# Patient Record
Sex: Female | Born: 1935 | Race: White | Hispanic: No | Marital: Married | Smoking: Never smoker
Health system: Southern US, Community
[De-identification: ages and names within clinical notes are randomized; demographics above are authoritative.]

## PROBLEM LIST (undated history)

## (undated) DIAGNOSIS — Z8619 Personal history of other infectious and parasitic diseases: Secondary | ICD-10-CM

## (undated) DIAGNOSIS — I420 Dilated cardiomyopathy: Secondary | ICD-10-CM

## (undated) DIAGNOSIS — K222 Esophageal obstruction: Secondary | ICD-10-CM

## (undated) DIAGNOSIS — R1310 Dysphagia, unspecified: Secondary | ICD-10-CM

## (undated) DIAGNOSIS — E039 Hypothyroidism, unspecified: Secondary | ICD-10-CM

## (undated) DIAGNOSIS — I4891 Unspecified atrial fibrillation: Secondary | ICD-10-CM

## (undated) DIAGNOSIS — I5022 Chronic systolic (congestive) heart failure: Secondary | ICD-10-CM

## (undated) DIAGNOSIS — I10 Essential (primary) hypertension: Secondary | ICD-10-CM

## (undated) DIAGNOSIS — R42 Dizziness and giddiness: Secondary | ICD-10-CM

## (undated) HISTORY — DX: Chronic systolic (congestive) heart failure: I50.22

## (undated) HISTORY — PX: APPENDECTOMY: SHX54

## (undated) HISTORY — DX: Personal history of other infectious and parasitic diseases: Z86.19

## (undated) HISTORY — PX: THYROIDECTOMY: SHX17

## (undated) HISTORY — DX: Dysphagia, unspecified: R13.10

## (undated) HISTORY — PX: FOOT SURGERY: SHX648

## (undated) HISTORY — PX: TONSILLECTOMY: SHX5217

## (undated) HISTORY — DX: Esophageal obstruction: K22.2

## (undated) HISTORY — DX: Unspecified atrial fibrillation: I48.91

## (undated) HISTORY — DX: Dizziness and giddiness: R42

## (undated) HISTORY — PX: VEIN LIGATION: SHX2652

## (undated) HISTORY — PX: CATARACT EXTRACTION: SUR2

## (undated) HISTORY — DX: Hypothyroidism, unspecified: E03.9

## (undated) HISTORY — DX: Dilated cardiomyopathy: I42.0

## (undated) HISTORY — DX: Essential (primary) hypertension: I10

---

## 1998-09-26 ENCOUNTER — Other Ambulatory Visit: Admission: RE | Admit: 1998-09-26 | Discharge: 1998-09-26 | Payer: Self-pay | Admitting: Family Medicine

## 1999-08-24 ENCOUNTER — Emergency Department (HOSPITAL_COMMUNITY): Admission: EM | Admit: 1999-08-24 | Discharge: 1999-08-24 | Payer: Self-pay | Admitting: Emergency Medicine

## 1999-08-24 ENCOUNTER — Encounter: Payer: Self-pay | Admitting: Emergency Medicine

## 1999-09-03 ENCOUNTER — Encounter: Payer: Self-pay | Admitting: Family Medicine

## 1999-09-03 ENCOUNTER — Encounter: Admission: RE | Admit: 1999-09-03 | Discharge: 1999-09-03 | Payer: Self-pay | Admitting: Family Medicine

## 1999-10-05 ENCOUNTER — Ambulatory Visit (HOSPITAL_COMMUNITY): Admission: RE | Admit: 1999-10-05 | Discharge: 1999-10-05 | Payer: Self-pay | Admitting: Neurosurgery

## 1999-10-05 ENCOUNTER — Encounter: Payer: Self-pay | Admitting: Neurosurgery

## 1999-10-09 ENCOUNTER — Encounter: Payer: Self-pay | Admitting: Neurosurgery

## 1999-10-09 ENCOUNTER — Encounter: Admission: RE | Admit: 1999-10-09 | Discharge: 1999-10-09 | Payer: Self-pay | Admitting: Neurosurgery

## 1999-10-16 ENCOUNTER — Ambulatory Visit (HOSPITAL_COMMUNITY): Admission: RE | Admit: 1999-10-16 | Discharge: 1999-10-16 | Payer: Self-pay | Admitting: Neurosurgery

## 1999-10-16 ENCOUNTER — Encounter: Payer: Self-pay | Admitting: Neurosurgery

## 1999-10-20 ENCOUNTER — Encounter: Payer: Self-pay | Admitting: Neurological Surgery

## 1999-10-20 ENCOUNTER — Encounter (INDEPENDENT_AMBULATORY_CARE_PROVIDER_SITE_OTHER): Payer: Self-pay | Admitting: *Deleted

## 1999-10-20 ENCOUNTER — Encounter (INDEPENDENT_AMBULATORY_CARE_PROVIDER_SITE_OTHER): Payer: Self-pay | Admitting: Specialist

## 1999-10-21 ENCOUNTER — Inpatient Hospital Stay (HOSPITAL_COMMUNITY): Admission: RE | Admit: 1999-10-21 | Discharge: 1999-10-29 | Payer: Self-pay | Admitting: Neurological Surgery

## 1999-10-21 ENCOUNTER — Encounter: Payer: Self-pay | Admitting: General Surgery

## 1999-10-22 ENCOUNTER — Encounter: Payer: Self-pay | Admitting: Neurological Surgery

## 1999-11-10 ENCOUNTER — Encounter: Admission: RE | Admit: 1999-11-10 | Discharge: 1999-11-10 | Payer: Self-pay | Admitting: Internal Medicine

## 1999-11-27 ENCOUNTER — Encounter: Admission: RE | Admit: 1999-11-27 | Discharge: 1999-11-27 | Payer: Self-pay | Admitting: Neurosurgery

## 1999-11-27 ENCOUNTER — Encounter: Payer: Self-pay | Admitting: Neurosurgery

## 2000-01-06 ENCOUNTER — Encounter: Admission: RE | Admit: 2000-01-06 | Discharge: 2000-01-06 | Payer: Self-pay | Admitting: Internal Medicine

## 2000-03-19 ENCOUNTER — Encounter: Admission: RE | Admit: 2000-03-19 | Discharge: 2000-03-19 | Payer: Self-pay | Admitting: Internal Medicine

## 2000-03-29 ENCOUNTER — Encounter: Payer: Self-pay | Admitting: Internal Medicine

## 2000-03-29 ENCOUNTER — Ambulatory Visit (HOSPITAL_COMMUNITY): Admission: RE | Admit: 2000-03-29 | Discharge: 2000-03-29 | Payer: Self-pay | Admitting: Internal Medicine

## 2000-04-06 ENCOUNTER — Ambulatory Visit (HOSPITAL_COMMUNITY): Admission: RE | Admit: 2000-04-06 | Discharge: 2000-04-06 | Payer: Self-pay | Admitting: Internal Medicine

## 2000-04-06 ENCOUNTER — Encounter: Payer: Self-pay | Admitting: Internal Medicine

## 2000-05-07 ENCOUNTER — Encounter: Admission: RE | Admit: 2000-05-07 | Discharge: 2000-05-07 | Payer: Self-pay | Admitting: Internal Medicine

## 2000-09-29 ENCOUNTER — Other Ambulatory Visit: Admission: RE | Admit: 2000-09-29 | Discharge: 2000-09-29 | Payer: Self-pay | Admitting: Family Medicine

## 2001-09-09 ENCOUNTER — Encounter: Payer: Self-pay | Admitting: Family Medicine

## 2001-09-09 ENCOUNTER — Encounter: Admission: RE | Admit: 2001-09-09 | Discharge: 2001-09-09 | Payer: Self-pay | Admitting: Family Medicine

## 2004-03-17 ENCOUNTER — Inpatient Hospital Stay (HOSPITAL_COMMUNITY): Admission: RE | Admit: 2004-03-17 | Discharge: 2004-03-18 | Payer: Self-pay | Admitting: General Surgery

## 2004-07-17 ENCOUNTER — Emergency Department (HOSPITAL_COMMUNITY): Admission: EM | Admit: 2004-07-17 | Discharge: 2004-07-17 | Payer: Self-pay | Admitting: Emergency Medicine

## 2007-04-29 ENCOUNTER — Encounter: Admission: RE | Admit: 2007-04-29 | Discharge: 2007-04-29 | Payer: Self-pay | Admitting: Family Medicine

## 2007-06-09 ENCOUNTER — Encounter: Admission: RE | Admit: 2007-06-09 | Discharge: 2007-06-09 | Payer: Self-pay | Admitting: Family Medicine

## 2007-06-17 ENCOUNTER — Encounter: Admission: RE | Admit: 2007-06-17 | Discharge: 2007-06-17 | Payer: Self-pay | Admitting: Family Medicine

## 2009-12-24 ENCOUNTER — Ambulatory Visit: Payer: Self-pay | Admitting: Cardiology

## 2010-01-27 ENCOUNTER — Ambulatory Visit: Payer: Self-pay | Admitting: Cardiology

## 2010-02-24 ENCOUNTER — Ambulatory Visit: Payer: Self-pay | Admitting: Cardiology

## 2010-03-24 ENCOUNTER — Ambulatory Visit: Payer: Self-pay | Admitting: Cardiology

## 2010-04-21 ENCOUNTER — Ambulatory Visit: Payer: Self-pay | Admitting: Cardiology

## 2010-05-21 ENCOUNTER — Ambulatory Visit: Payer: Self-pay | Admitting: Cardiology

## 2010-06-08 ENCOUNTER — Encounter: Payer: Self-pay | Admitting: Cardiology

## 2010-06-17 ENCOUNTER — Ambulatory Visit: Payer: Self-pay | Admitting: Cardiology

## 2010-07-14 ENCOUNTER — Encounter (INDEPENDENT_AMBULATORY_CARE_PROVIDER_SITE_OTHER): Payer: 59

## 2010-07-14 DIAGNOSIS — Z7901 Long term (current) use of anticoagulants: Secondary | ICD-10-CM

## 2010-07-14 DIAGNOSIS — I4891 Unspecified atrial fibrillation: Secondary | ICD-10-CM

## 2010-08-04 ENCOUNTER — Ambulatory Visit (INDEPENDENT_AMBULATORY_CARE_PROVIDER_SITE_OTHER): Payer: 59 | Admitting: Cardiology

## 2010-08-04 DIAGNOSIS — I428 Other cardiomyopathies: Secondary | ICD-10-CM

## 2010-08-04 DIAGNOSIS — I4891 Unspecified atrial fibrillation: Secondary | ICD-10-CM

## 2010-08-04 DIAGNOSIS — I10 Essential (primary) hypertension: Secondary | ICD-10-CM

## 2010-08-04 DIAGNOSIS — Z7901 Long term (current) use of anticoagulants: Secondary | ICD-10-CM

## 2010-08-06 ENCOUNTER — Other Ambulatory Visit: Payer: Self-pay | Admitting: Family Medicine

## 2010-08-06 ENCOUNTER — Ambulatory Visit
Admission: RE | Admit: 2010-08-06 | Discharge: 2010-08-06 | Disposition: A | Discharge status: Home / Self Care | Payer: 59 | Source: Ambulatory Visit | Attending: Family Medicine | Admitting: Family Medicine

## 2010-08-06 DIAGNOSIS — R9389 Abnormal findings on diagnostic imaging of other specified body structures: Secondary | ICD-10-CM

## 2010-08-06 DIAGNOSIS — W19XXXA Unspecified fall, initial encounter: Secondary | ICD-10-CM

## 2010-08-06 IMAGING — CR DG CHEST 2V
2 series · 2 of 2 positions shown · non-contrast
Comparison: Chest x-ray of [DATE]

CLINICAL DATA: Fell this morning, questioned abnormality at the
lung base on left shoulder films

CHEST - 2 VIEW

[view not recorded (1 of 2)]
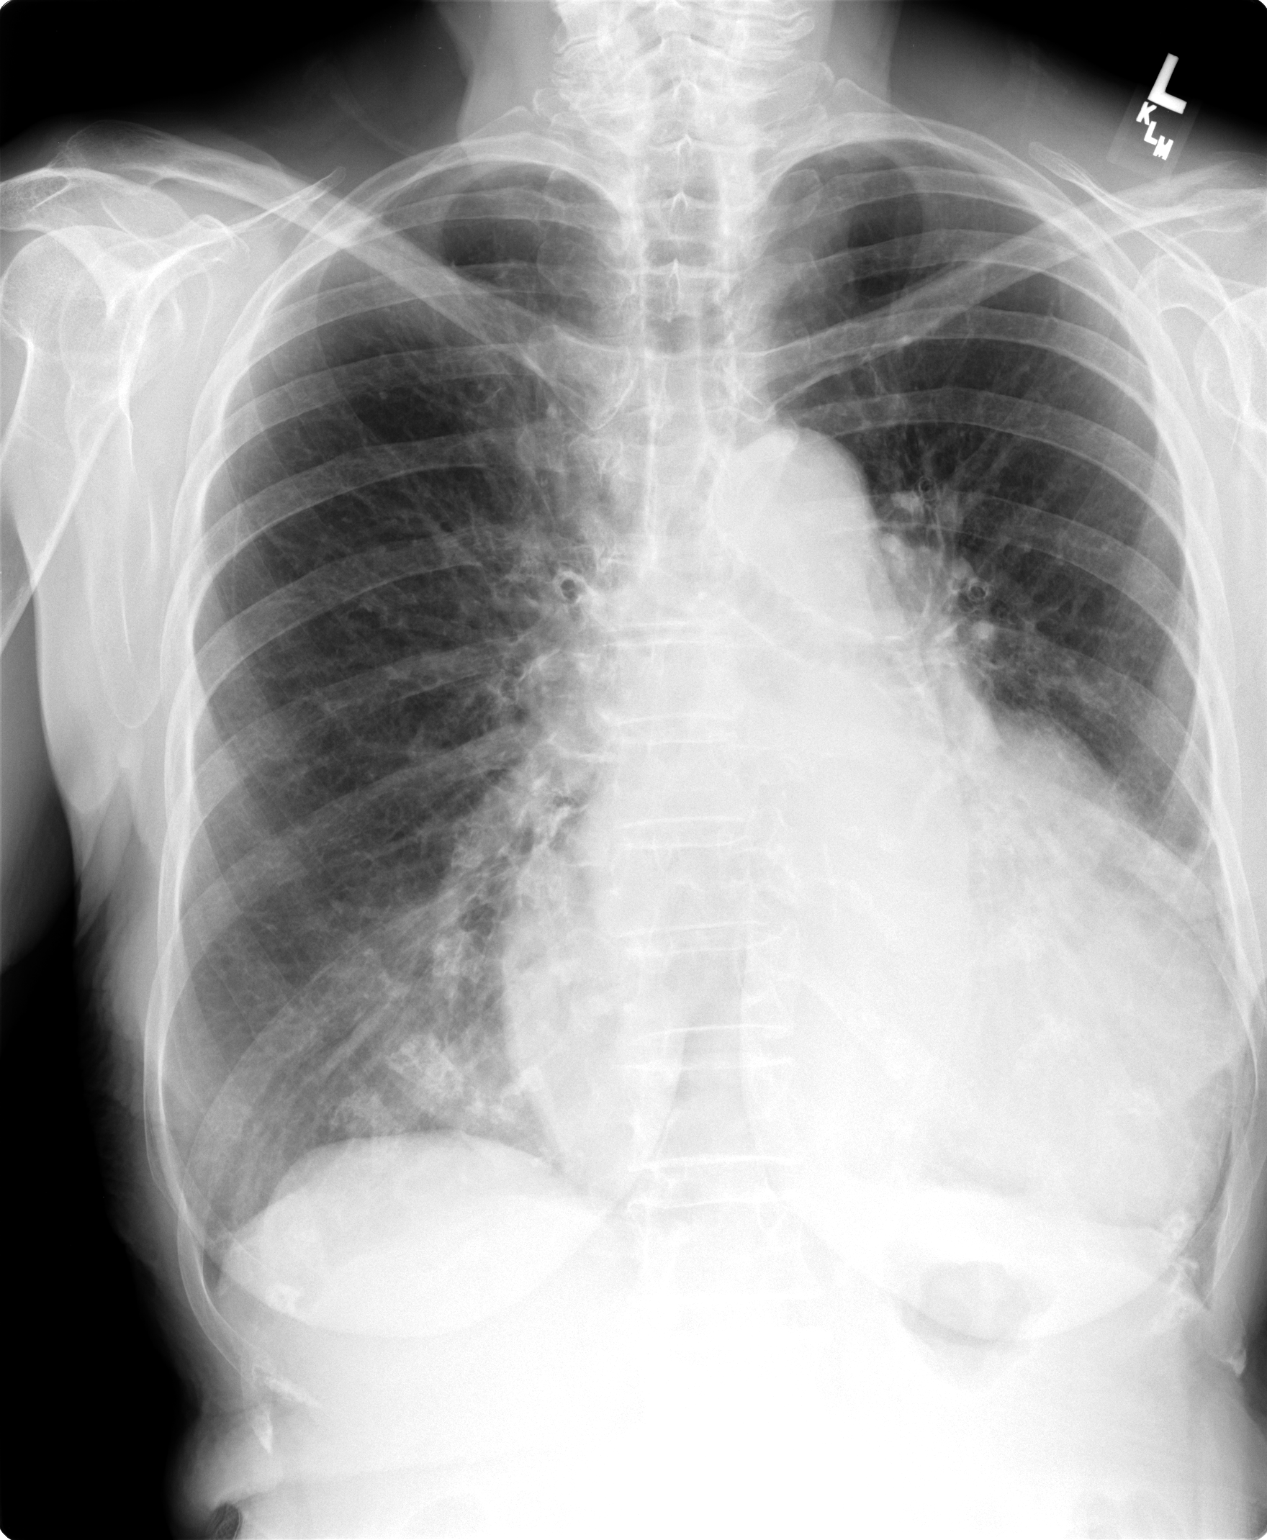

[view not recorded (2 of 2)]
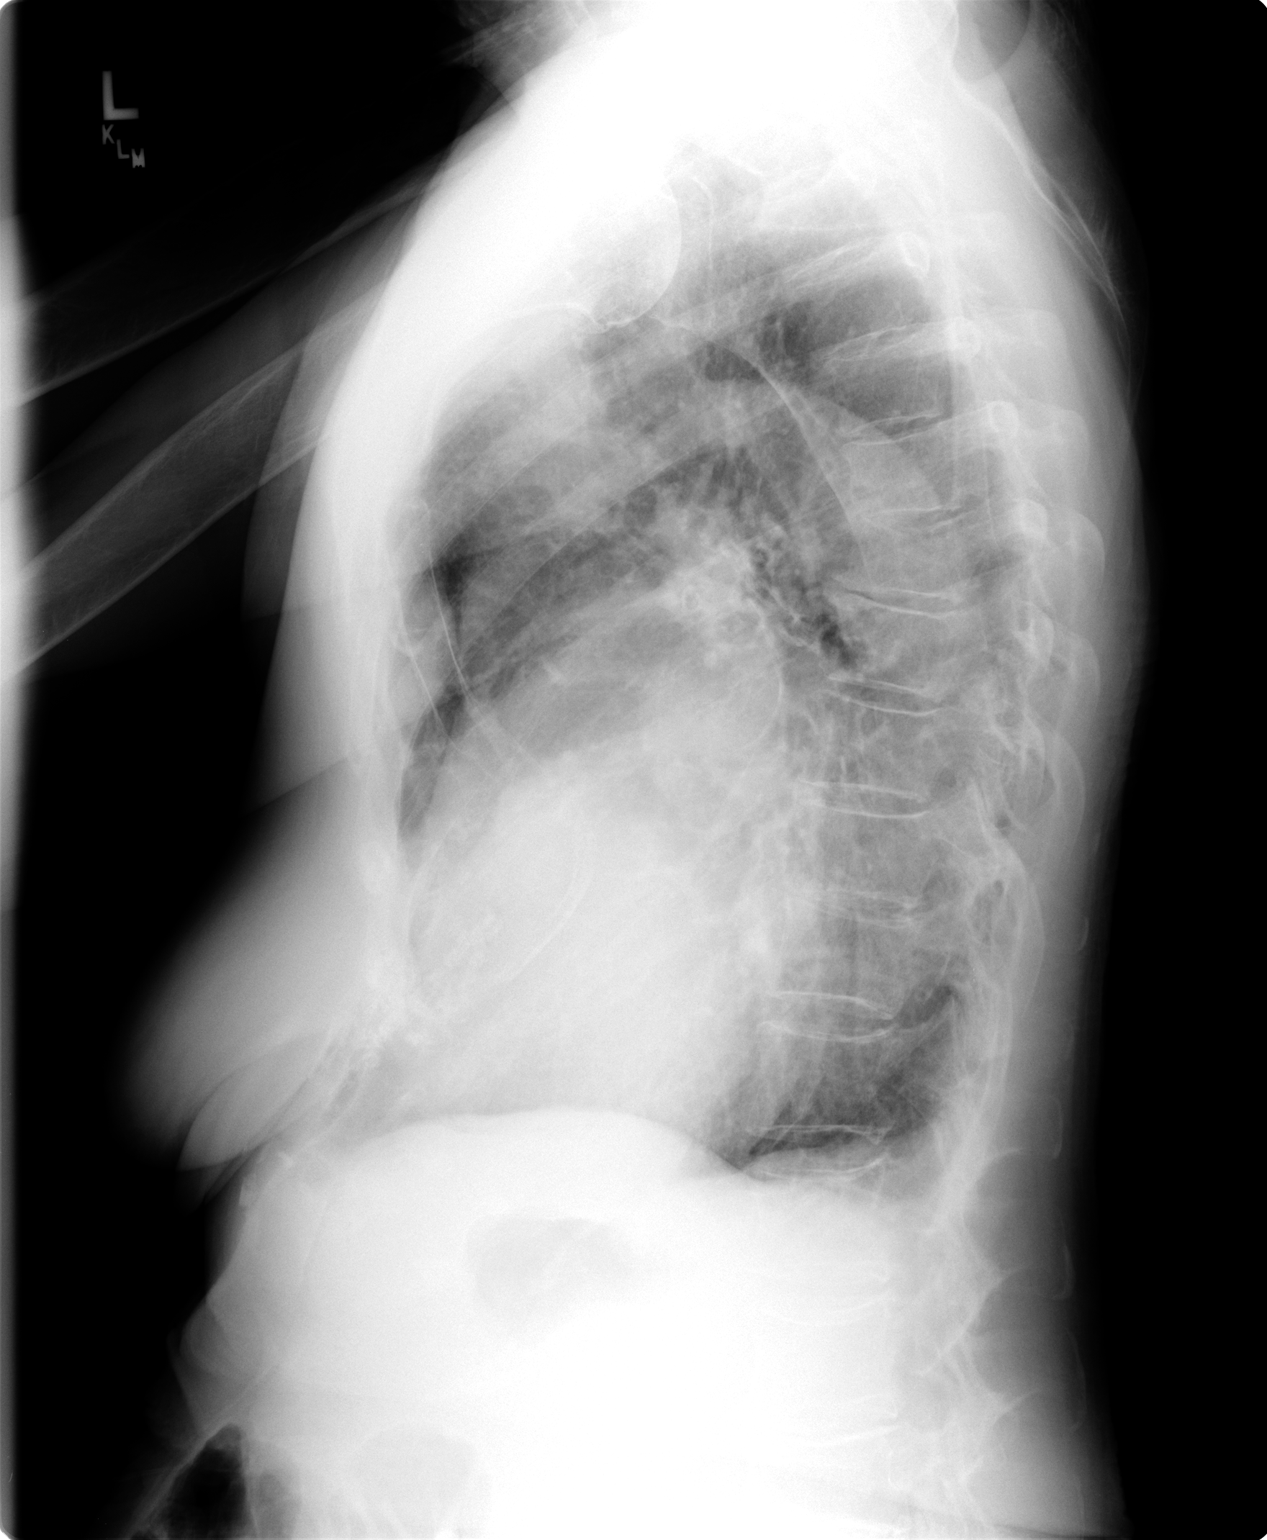

[2 of 2 positions shown; findings below may reference images not displayed]

FINDINGS: The opacity noted on short film represents a
significantly enlarged cardiac silhouette.  No infiltrate or
effusion is seen.  The bones are osteopenic.
IMPRESSION: The abnormality on left shoulder films at the left lung base
represents an enlarged heart.  No infiltrate or effusion is seen.

## 2010-08-13 ENCOUNTER — Telehealth: Payer: Self-pay | Admitting: *Deleted

## 2010-08-13 NOTE — Telephone Encounter (Signed)
Message copied by Murrell Redden on Wed Aug 13, 2010  1:56 PM ------      Message from: Sheffield Slider      Created: Wed Aug 13, 2010  9:34 AM      Regarding: UPDATE/JORDAN      Contact: [phone]       PT WANTS TO UPDATE Gabriela Hernandez ON SOMETHING ANOTHER MD TOLD HER.  CHART IN BOX.  956AM

## 2010-08-13 NOTE — Telephone Encounter (Signed)
Message copied by Murrell Redden on Wed Aug 13, 2010  2:02 PM ------      Message from: Sheffield Slider      Created: Wed Aug 13, 2010  9:34 AM      Regarding: UPDATE/JORDAN      Contact: [phone]       PT WANTS TO UPDATE Gabriela Hernandez ON SOMETHING ANOTHER MD TOLD HER.  CHART IN BOX.  956AM

## 2010-08-13 NOTE — Telephone Encounter (Signed)
Called stating she fell last week and injured her shoulder.  Has been on antibiotic. Will recheck inr next week

## 2010-08-19 ENCOUNTER — Ambulatory Visit (INDEPENDENT_AMBULATORY_CARE_PROVIDER_SITE_OTHER): Payer: 59 | Admitting: *Deleted

## 2010-08-19 DIAGNOSIS — I4891 Unspecified atrial fibrillation: Secondary | ICD-10-CM

## 2010-08-19 DIAGNOSIS — Z7901 Long term (current) use of anticoagulants: Secondary | ICD-10-CM

## 2010-08-20 ENCOUNTER — Encounter: Payer: 59 | Admitting: *Deleted

## 2010-09-08 ENCOUNTER — Encounter: Payer: 59 | Admitting: *Deleted

## 2010-09-16 ENCOUNTER — Ambulatory Visit (INDEPENDENT_AMBULATORY_CARE_PROVIDER_SITE_OTHER): Payer: 59 | Admitting: *Deleted

## 2010-09-16 DIAGNOSIS — I4891 Unspecified atrial fibrillation: Secondary | ICD-10-CM

## 2010-09-16 LAB — POCT INR: INR: 2.8

## 2010-09-17 ENCOUNTER — Encounter: Payer: 59 | Admitting: *Deleted

## 2010-10-03 NOTE — Op Note (Signed)
Gabriela Hernandez, LOS             ACCOUNT NO.:  [id number]   MEDICAL RECORD NO.:  [id number]          PATIENT TYPE:  INP   LOCATION:  0005                         FACILITY:  [facility]   PHYSICIAN:  Sharlet Salina T. Hoxworth, M.D.DATE OF BIRTH:  11-07-1926   DATE OF PROCEDURE:  03/17/2004  DATE OF DISCHARGE:                                 OPERATIVE REPORT   PREOPERATIVE DIAGNOSIS:  Right lower quadrant ventral incisional hernia.   POSTOPERATIVE DIAGNOSIS:  Right lower quadrant ventral incisional hernia and  small right inguinal hernia.   PROCEDURE:  Laparoscopic repair of ventral incisional and right inguinal  hernias.   SURGEON:  Lorne Skeens. Hoxworth, M.D.   ANESTHESIA:  General.   INDICATIONS FOR PROCEDURE:  Gabriela Hernandez is a 77 year old female who  presents with an increasingly painful intermittent bulge in the right lower  quadrant.  Examination reveals a ventral incisional hernia in the right  lower quadrant that appears to be coming through the inferior aspect of a  previous right lower abdominal paramedian incision for appendectomy.  She is  obviously having intermittent incarceration, and laparoscopic and possible  open repair has been recommended and accepted.  The nature of the procedure,  its indications, risks of bleeding, infection, and recurrence were discussed  and understood.  She is now brought to the operating room for this  procedure.   DESCRIPTION OF PROCEDURE:  The patient was brought to the operating room and  placed in the supine position on the operating table.  General endotracheal  anesthesia was induced.  She received broad-spectrum antibiotics  preoperatively.  A Foley catheter and PS were in place.  The abdomen was  widely sterilely prepped and draped.  The trocar sites were anesthetized  with Marcaine prior to the incisions.   Through a 1-cm incision in the left midabdomen, the peritoneal cavity was  easily accessed with an Optiview trocar, and  pneumoperitoneum was  established.  Video laparoscopy revealed, as expected, an incisional hernia  approximately 3 to 4 cm in diameter in the right lower quadrant, likely at  the inferior end of her previous paramedian incision.  Also noted a couple  of centimeters posterior and inferior to this was a small indirect inguinal  hernia as well.  I felt that this should be repaired as well, and it  appeared that a single piece of mesh could be used to nicely cover both  areas.  In order to provide fixation posteriorly to the inguinal hernia, a  dissection was performed posterior to this, incising the peritoneum along  the pubis and up towards the internal ring.  The peritoneum was stripped  posteriorly.  The hernia sac was reduced completely and completely excised  and removed from the abdominal cavity. The peritoneum was then cleared off  of the pubic tubercle and Cooper's ligaments.  The iliac vessels were  carefully identified and protected.  There were no adhesions up into the  incisional hernia.  A 10 x 15-cm piece of Parietex dual mesh was chosen.  Three 0 Prolene sutures were placed along the superior edge of the mesh  where they could  be brought through the anterior abdominal wall.  The mesh  was then moistened and a roll introduced into the abdominal cavity.  It was  then unfurled and laid over both defects and was seen to provide a nice  broad coverage.  I initially affixed the medial inferior corner of the mesh  to the pubic symphysis with the endo tacker, and then the inferior edge was  tacked down along Cooper's ligament, stopping short of the iliac vessels.  The inferior edge of the mesh was then allowed to lie over the iliac vessels  in the posterior area of the inguinal canal without tacks.  The previously  placed Prolene sutures along the superior and anterior edge of the mesh were  then brought out through corresponding small stab wounds and secured.  The  edge of the mesh was  then tacked circumferentially to the anterior abdominal  wall, tacking just anteriorly and superiorly where the tacker could be felt  through the anterior abdominal wall.  Again, this appeared to provide nice  broad coverage of both the inguinal space and the incisional hernia with  several centimeter overlap.  The peritoneal flap was then tacked back over  the posterior portion of the mesh.  The area was inspected for hemostasis  which appeared complete.  The trocars were removed under direct vision and  all CO2 evacuated.  The skin incisions were closed with interrupted  subcuticular Monocryl and Steri-Strips.  Sponge, needle, and instrument  counts were correct.  Dressings were applied and the patient taken to  recovery in good condition.      BTH/MEDQ  D:  03/17/2004  T:  03/17/2004  Job:  [id number]

## 2010-10-03 NOTE — Discharge Summary (Signed)
[place]. [facility]  Patient:    Gabriela Hernandez, Gabriela Hernandez                    MRN: [id number] Adm. Date:  [id number] Disc. Date: [id number] Attending:  Coletta Memos                           Discharge Summary  ADMISSION DIAGNOSIS:  Hip pain and right lower extremity pain.  DISCHARGE DIAGNOSIS:  Right Staphylococcus aureus infection of the right sacroiliac joint.  INDICATION:  This is a patient who I had seen in my office for a number of weeks for evaluation of right lower extremity pain.  MRI was never impressive enough nor was it anatomically correct for the type of pain that she described.  She also had plain x-rays which were done prior to admission which did not show any abnormalities.  Upon my absence, she called up Dr. Danielle Dess who had the patient come in.  She had another MRI scan which revealed an infiltrative lesion involving the ileum, posterior iliac crest, and the sacrum around the region of the sacroiliac joint.  Since she had been on Coumadin, that needed to be stopped, and she was admitted to the hospital October 20, 1999, so she could undergo a needle biopsy of the right pelvis and hip.  The initial biopsy was negative, and she underwent a second biopsy.  What that showed was a Staphylococcus aureus infection of the right sacroiliac joint. She had a consultation by the infectious disease service and was started on Ancef.  She also had a PICC line placed.  She had a TEE during her hospitalization which was negative and a general surgery consultation, in as much as we were not sure prior to diagnosis of infection, if she would need to have operative removal of this mass.  At the time of discharge, the patient was moving her right lower extremity well though with significant pain.  She was receiving Kefzol.  She was to be followed up by the infectious disease service here.  She was also going to come back to see me in approximately three weeks with x-rays  of the right hip.  DISCHARGE MEDICATIONS:  Included Kefzol, Lasix, Synthroid, Accupril, Prempro, along with pain medications. DD:  01/16/00 TD:  01/18/00 Job: [id number]

## 2010-10-03 NOTE — Consult Note (Signed)
Gabriela Hernandez  Patient:    Gabriela, Hernandez                    MRN: [id number] Proc. Date: 10/20/99 Adm. Date:  [id number] Attending:  Jonne Ply                          Consultation Report  HISTORY OF PRESENT ILLNESS:  I was asked by Dr. Franky Macho to evaluate Gabriela Hernandez for persistent right back, hip, and leg pain with a possible right pelvic mass.  Gabriela Hernandez presents with an approximately two-month history of persistent pain.  This had the fairly sudden onset over a day or two in early April.  Gabriela Hernandez was initially treated symptomatically with muscle relaxers and anti-inflammatories, without improvement.  Gabriela Hernandez describes significant aching pain in her low right back posteriorly, radiating down along her right hip, buttock, and posterior right leg.  No numbness or weakness.  The pain has been somewhat positional and related to activity, but essentially constant.  Gabriela Hernandez saw Dr. Franky Macho in mid May, and a workup was initiated of plain films of the lumbosacral spine which were negative. Subsequently, an MRI of the LS spine was obtained, which again, was negative. Her pain persisted and recently, an MR of the right hip was obtained.  I do not have this report at the time of the consultation, but it has apparently shown an abnormal infiltrative process or mass involving the right side of the pelvis.  It was not clear, apparently, whether this was inflammatory or neoplastic.  A fine needle aspirate was performed today without return of apparent fluid or pus.  Cytology and gram stain and cultures were obtained and are all pending at the time of this dictation.  During this time, the patient has had no GI symptoms whatsoever.  Gabriela Hernandez specifically denies nausea, vomiting, change in bowel habits, melena, hematochezia, or weight loss.  Gabriela Hernandez has not had any fever or chills.  Gabriela Hernandez has no malaise or constitutional symptoms.  Gabriela Hernandez has no previous history of any  similar pain.  Gabriela Hernandez has had no vaginal bleeding or discharge.  PAST MEDICAL HISTORY:  Surgical history includes two C sections and an abdominal hernia repair in 1997.  Gabriela Hernandez has also had a thyroidectomy and varicose vein stripping.  Gabriela Hernandez has a history of atrial fibrillation, mild CHF, and lower extremity edema.  CHRONIC MEDICATIONS:  Lasix, Coumadin, Accupril, Synthroid, and Prempro.  Gabriela Hernandez has been off her Coumadin for several days prior to the FNA.  ALLERGIES:  No known drug allergies.  SOCIAL HISTORY:  Gabriela Hernandez is married.  Does not smoke cigarettes or drink alcohol.  FAMILY HISTORY:  Noncontributory.  REVIEW OF SYSTEMS:  Positive extremities as above.  Positive cardiovascular for occasional palpitations and leg swelling and mild DOE.  GI negative as above.  PHYSICAL EXAMINATION:  VITAL SIGNS:  Temperature 97.5, pulse 70, respirations 18, blood pressure 130/80.  GENERAL:  Gabriela Hernandez is a healthy-appearing elderly white female in no acute distress.  SKIN:  Warm and dry without rash or infection.  HEENT:  Well-healed thyroidectomy scar.  No neck masses or adenopathy. Sclerae clear.  LUNGS:  Clear to auscultation.  CARDIAC:  Irregular rhythm.  No murmurs or gallops.  Trace lower extremity edema, slightly greater on the right than the left, with superficial varicosities.  BREASTS:  No masses, tenderness, or discharge.  LYMPH NODES:  No cervical, supraclavicular, axillary, or inguinal nodes  palpable.  ABDOMEN:  Well-healed low midline incisions.  Soft with very mild tenderness in the right lower quadrant to deep palpation.  I cannot appreciate any masses, fullness, or hepatosplenomegaly.  PELVIC/RECTAL:  No masses or tenderness.  No stool in the rectal vault.  EXTREMITIES:  Superficial varicosities and slight edema bilaterally, slightly greater on the right than the left.  There is some pain with flexion of the right hip.  NEUROLOGIC:  Gabriela Hernandez is alert and fully oriented.  Her motor  and sensory exams are grossly normal in the extremities.  ASSESSMENT AND PLAN:  Persistent right back and hip pain and a recent MRI apparently showing an infiltrative process in the right pelvis.  Clinically, there is nothing to suggest infection.  I would be more concerned about neoplasm.  I need to obtain her MR films and report for review and for further anatomic clarification I am going to obtain a CT scan of the abdomen and pelvis.  I will await this study and results of her cytology, cultures, and gram stain for further recommendations. DD:  10/20/99 TD:  10/21/99 Job: [id number]
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
## 2010-10-03 NOTE — Procedures (Signed)
[place]. [facility]  Patient:    Gabriela Hernandez, Gabriela Hernandez                    MRN: [id number] Proc. Date: 10/27/99 Adm. Date:  [id number] Attending:  Coletta Memos CC:         Stefani Dama, M.D.             Ward Roxan Hockey, M.D.                           Procedure Report  PROCEDURE:  Transesophageal echocardiogram.  INDICATIONS FOR STUDY:  Patient is a 77 year old white female with bacterial sacroiliitis.  Need to rule out endocarditis.  DESCRIPTION OF PROCEDURE:  Patient was sedated with 3 mg of IV Versed and 30 mg of IV Demerol.  Transesophageal scope was passed easily into the esophagus and excellent image quality was obtained.  FINDINGS:  The mitral valve was structurally delicate.  The aortic valve was trileaflet and appears normal.  The pulmonic valve is delicate and appears normal.  The tricuspid valve was normal.  The left atrium appears upper limits of normal in size.  The left atrial appendage was normal.  The right atrium was normal.  The interatrial septum was intact.  Right ventricular size and function appeared normal.  There was no pericardial effusion.  The left ventricle appears globally hypokinetic with severe left ventricular dysfunction.  Ejection fraction was estimated at 25-30%.  No thrombi were noted.  No vegetations were seen.  Doppler data, color-flow and pulse-wave Doppler demonstrated evidence of moderate mitral insufficiency.  There was trivial tricuspid insufficiency. Remainder of the Doppler study was normal, including Doppler of the left superior pulmonic vein.  Interrogation of the ascending and descending aorta demonstrated spontaneous echo contrast within the aorta.  No significant atherosclerosis was noted. There was no evidence of dissection.  FINAL INTERPRETATION: 1. Severe left ventricular dysfunction with global hypokinesia. 2. Moderate mitral insufficiency. 3. No vegetations noted. 4. Spontaneous echo contrast  noted in the aorta without evidence of thrombus. DD:  10/27/99 TD:  10/29/99 Job: [id number]

## 2010-10-15 ENCOUNTER — Ambulatory Visit (INDEPENDENT_AMBULATORY_CARE_PROVIDER_SITE_OTHER): Payer: 59 | Admitting: *Deleted

## 2010-10-15 DIAGNOSIS — I4891 Unspecified atrial fibrillation: Secondary | ICD-10-CM

## 2010-11-12 ENCOUNTER — Ambulatory Visit (INDEPENDENT_AMBULATORY_CARE_PROVIDER_SITE_OTHER): Payer: 59 | Admitting: *Deleted

## 2010-11-12 DIAGNOSIS — I4891 Unspecified atrial fibrillation: Secondary | ICD-10-CM

## 2010-11-12 LAB — POCT INR: INR: 3.1

## 2010-12-10 ENCOUNTER — Ambulatory Visit (INDEPENDENT_AMBULATORY_CARE_PROVIDER_SITE_OTHER): Payer: 59 | Admitting: *Deleted

## 2010-12-10 DIAGNOSIS — I4891 Unspecified atrial fibrillation: Secondary | ICD-10-CM

## 2010-12-10 LAB — POCT INR: INR: 2.4

## 2011-01-07 ENCOUNTER — Ambulatory Visit (INDEPENDENT_AMBULATORY_CARE_PROVIDER_SITE_OTHER): Payer: Medicare Other | Admitting: *Deleted

## 2011-01-07 DIAGNOSIS — I4891 Unspecified atrial fibrillation: Secondary | ICD-10-CM

## 2011-01-07 LAB — POCT INR: INR: 2.7

## 2011-02-02 ENCOUNTER — Encounter: Payer: Self-pay | Admitting: Cardiology

## 2011-02-04 ENCOUNTER — Ambulatory Visit (INDEPENDENT_AMBULATORY_CARE_PROVIDER_SITE_OTHER): Payer: Medicare Other | Admitting: *Deleted

## 2011-02-04 DIAGNOSIS — I4891 Unspecified atrial fibrillation: Secondary | ICD-10-CM

## 2011-02-11 ENCOUNTER — Ambulatory Visit (INDEPENDENT_AMBULATORY_CARE_PROVIDER_SITE_OTHER): Payer: Medicare Other | Admitting: Cardiology

## 2011-02-11 ENCOUNTER — Encounter: Payer: Self-pay | Admitting: Cardiology

## 2011-02-11 DIAGNOSIS — I10 Essential (primary) hypertension: Secondary | ICD-10-CM

## 2011-02-11 DIAGNOSIS — I4891 Unspecified atrial fibrillation: Secondary | ICD-10-CM

## 2011-02-11 DIAGNOSIS — I420 Dilated cardiomyopathy: Secondary | ICD-10-CM

## 2011-02-11 DIAGNOSIS — I428 Other cardiomyopathies: Secondary | ICD-10-CM

## 2011-02-11 NOTE — Patient Instructions (Signed)
Continue your current medications  I will see you again in 6 months.   

## 2011-02-12 DIAGNOSIS — I110 Hypertensive heart disease with heart failure: Secondary | ICD-10-CM | POA: Insufficient documentation

## 2011-02-12 DIAGNOSIS — I420 Dilated cardiomyopathy: Secondary | ICD-10-CM | POA: Insufficient documentation

## 2011-02-12 NOTE — Assessment & Plan Note (Signed)
Rate is well controlled and she is on chronic anticoagulation. We'll continue with her current therapy and follow up again in 6 months.

## 2011-02-12 NOTE — Progress Notes (Signed)
   Gabriela Hernandez Date of Birth: 1927-04-05   History of Present Illness: [name] is seen today for followup. She has a history of dilated cardiomyopathy and chronic atrial fibrillation. She reports that she is doing very well. She notes some mild fatigue. She has lost 7 pounds from her previous visit. She denies any shortness of breath, chest pain, or dizziness. She's had no increase in edema or orthopnea.  Current Outpatient Prescriptions on File Prior to Visit  Medication Sig Dispense Refill  . carvedilol (COREG) 25 MG tablet Take 25 mg by mouth 2 (two) times daily with a meal.        . furosemide (LASIX) 20 MG tablet Take 20 mg by mouth daily.        . Multiple Vitamin (MULTI-VITAMIN PO) Take by mouth daily.        . quinapril (ACCUPRIL) 20 MG tablet Take 20 mg by mouth daily. 1/2 daily        . THYROID PO Take by mouth.        . warfarin (COUMADIN) 7.5 MG tablet Take 7.5 mg by mouth as directed.          No Known Allergies  Past Medical History  Diagnosis Date  . Dilated cardiomyopathy     with ejection fraction of 30-35%  . Atrial fibrillation   . Hypertension   . Hypothyroidism   . Lightheadedness   . History of shingles     Past Surgical History  Procedure Date  . Cataract extraction   . Vein ligation   . Thyroidectomy   . Foot surgery   . Tonsillectomy   . Appendectomy   . Cesarean section     History  Smoking status  . Never Smoker   Smokeless tobacco  . Not on file    History  Alcohol Use No    Family History  Problem Relation Age of Onset  . Hypertension Mother   . Heart attack Father     Review of Systems: The review of systems is positive for weight loss. There's been no significant change in her diet. All other systems were reviewed and are negative.  Physical Exam: BP 130/80  Pulse 56  Ht 5\' 6"  (1.676 m)  Wt 140 lb 3.2 oz (63.594 kg)  BMI 22.63 kg/m2 She is a pleasant elderly white female in no acute distress. She is  normocephalic, atraumatic. Pupils are equal round and reactive to light and accommodation. Oropharynx is clear. Neck is supple no JVD, adenopathy, thyromegaly, or bruits. Lungs are clear. Cardiac exam reveals a grade 1-2/6 systolic murmur at apex. There is no S3. Abdomen is soft and nontender without masses. She has no significant edema. She does have diffuse superficial varicosities in her lower trinities. Neurologic exam is nonfocal. LABORATORY DATA: Recent INR was 3.9.  Assessment / Plan:

## 2011-02-12 NOTE — Assessment & Plan Note (Signed)
She remains asymptomatic. She is on appropriate therapy with Lasix, carvedilol, and Accupril. She will continue sodium restriction.

## 2011-02-12 NOTE — Assessment & Plan Note (Signed)
Blood pressure is well controlled on her current regimen. 

## 2011-02-25 ENCOUNTER — Ambulatory Visit (INDEPENDENT_AMBULATORY_CARE_PROVIDER_SITE_OTHER): Payer: Medicare Other | Admitting: *Deleted

## 2011-02-25 DIAGNOSIS — I4891 Unspecified atrial fibrillation: Secondary | ICD-10-CM

## 2011-03-25 ENCOUNTER — Ambulatory Visit (INDEPENDENT_AMBULATORY_CARE_PROVIDER_SITE_OTHER): Payer: Medicare Other | Admitting: *Deleted

## 2011-03-25 DIAGNOSIS — I4891 Unspecified atrial fibrillation: Secondary | ICD-10-CM

## 2011-03-25 LAB — POCT INR: INR: 2.2

## 2011-04-22 ENCOUNTER — Ambulatory Visit (INDEPENDENT_AMBULATORY_CARE_PROVIDER_SITE_OTHER): Payer: Medicare Other | Admitting: *Deleted

## 2011-04-22 DIAGNOSIS — I4891 Unspecified atrial fibrillation: Secondary | ICD-10-CM

## 2011-05-08 ENCOUNTER — Telehealth: Payer: Self-pay | Admitting: Cardiology

## 2011-05-08 NOTE — Telephone Encounter (Signed)
Is there any contraindication to this?

## 2011-05-08 NOTE — Telephone Encounter (Signed)
New msg Pt is taking mucinex and  Promethazine. She receiived these from Dr Clovis Riley. She wants to make sure she can take these with her current meds please let her know

## 2011-05-11 NOTE — Telephone Encounter (Signed)
Advised she could take Mucinex (blue) box; not with decongestant. States she is not nauseated so not taking the other medication.

## 2011-05-21 NOTE — Telephone Encounter (Signed)
Fu call Pt was prescibed promethazine with codeine, doxycycline. She wants to make sure that these meds are ok for her to take Please call

## 2011-05-22 NOTE — Telephone Encounter (Signed)
Patient was called,spoke with Dr.Jordan okay to take promethazine,codeine,doxycycline.Appointment scheduled to have INR checked 05/27/11.

## 2011-05-27 ENCOUNTER — Ambulatory Visit (INDEPENDENT_AMBULATORY_CARE_PROVIDER_SITE_OTHER): Payer: Medicare Other | Admitting: *Deleted

## 2011-05-27 DIAGNOSIS — I4891 Unspecified atrial fibrillation: Secondary | ICD-10-CM

## 2011-06-03 ENCOUNTER — Encounter: Payer: Medicare Other | Admitting: *Deleted

## 2011-06-17 ENCOUNTER — Other Ambulatory Visit: Payer: Self-pay | Admitting: *Deleted

## 2011-06-17 MED ORDER — WARFARIN SODIUM 5 MG PO TABS: 5.0000 mg | ORAL_TABLET | Freq: Every day | ORAL | Status: DC

## 2011-06-18 ENCOUNTER — Other Ambulatory Visit: Payer: Self-pay | Admitting: Pharmacist

## 2011-06-18 MED ORDER — WARFARIN SODIUM 5 MG PO TABS: 5.0000 mg | ORAL_TABLET | Freq: Every day | ORAL | Status: DC

## 2011-06-19 ENCOUNTER — Other Ambulatory Visit: Payer: Self-pay | Admitting: *Deleted

## 2011-06-19 ENCOUNTER — Other Ambulatory Visit: Payer: Self-pay

## 2011-06-19 MED ORDER — WARFARIN SODIUM 5 MG PO TABS: ORAL_TABLET | ORAL | Status: DC

## 2011-06-19 MED ORDER — WARFARIN SODIUM 5 MG PO TABS: 5.0000 mg | ORAL_TABLET | ORAL | Status: DC

## 2011-07-01 ENCOUNTER — Other Ambulatory Visit: Payer: Self-pay | Admitting: *Deleted

## 2011-07-02 ENCOUNTER — Telehealth: Payer: Self-pay | Admitting: Cardiology

## 2011-07-02 NOTE — Telephone Encounter (Signed)
New Problem:     Patient called in needing a refill of her carvedilol (COREG) 25 MG tablet.

## 2011-07-03 ENCOUNTER — Other Ambulatory Visit: Payer: Self-pay

## 2011-07-03 MED ORDER — CARVEDILOL 25 MG PO TABS: 25.0000 mg | ORAL_TABLET | Freq: Two times a day (BID) | ORAL | Status: DC

## 2011-07-08 ENCOUNTER — Ambulatory Visit (INDEPENDENT_AMBULATORY_CARE_PROVIDER_SITE_OTHER): Payer: Medicare Other | Admitting: Pharmacist

## 2011-07-08 DIAGNOSIS — I4891 Unspecified atrial fibrillation: Secondary | ICD-10-CM

## 2011-07-08 LAB — POCT INR: INR: 2.5

## 2011-08-04 ENCOUNTER — Other Ambulatory Visit: Payer: Self-pay | Admitting: Cardiology

## 2011-08-04 MED ORDER — FUROSEMIDE 20 MG PO TABS: 20.0000 mg | ORAL_TABLET | Freq: Every day | ORAL | Status: DC

## 2011-08-04 MED ORDER — QUINAPRIL HCL 20 MG PO TABS: 10.0000 mg | ORAL_TABLET | Freq: Every day | ORAL | Status: DC

## 2011-08-12 ENCOUNTER — Ambulatory Visit (INDEPENDENT_AMBULATORY_CARE_PROVIDER_SITE_OTHER): Payer: Medicare Other | Admitting: Pharmacist

## 2011-08-12 ENCOUNTER — Ambulatory Visit (INDEPENDENT_AMBULATORY_CARE_PROVIDER_SITE_OTHER): Payer: Medicare Other | Admitting: Cardiology

## 2011-08-12 ENCOUNTER — Encounter: Payer: Self-pay | Admitting: Cardiology

## 2011-08-12 VITALS — BP 140/70 | HR 59 | Ht 66.0 in | Wt 140.1 lb

## 2011-08-12 DIAGNOSIS — I428 Other cardiomyopathies: Secondary | ICD-10-CM

## 2011-08-12 DIAGNOSIS — Z7901 Long term (current) use of anticoagulants: Secondary | ICD-10-CM

## 2011-08-12 DIAGNOSIS — I10 Essential (primary) hypertension: Secondary | ICD-10-CM

## 2011-08-12 DIAGNOSIS — I4891 Unspecified atrial fibrillation: Secondary | ICD-10-CM

## 2011-08-12 DIAGNOSIS — I420 Dilated cardiomyopathy: Secondary | ICD-10-CM

## 2011-08-12 LAB — POCT INR: INR: 2.4

## 2011-08-12 NOTE — Assessment & Plan Note (Signed)
She has no signs or symptoms of congestive heart failure. She will continue with her ACE inhibitor, carvedilol, and diuretic therapy.

## 2011-08-12 NOTE — Assessment & Plan Note (Signed)
Her heart rate is adequately controlled. She is on chronic anticoagulation and INR is therapeutic.

## 2011-08-12 NOTE — Progress Notes (Signed)
   Gabriela Hernandez Noon Date of Birth: 10-15-1926   History of Present Illness: Gabriela Hernandez is seen today for followup. She has a history of dilated cardiomyopathy and chronic atrial fibrillation. She reports that she is doing very well.  She denies any shortness of breath, chest pain, or dizziness. She's had no increase in edema or orthopnea. She did have a bad virus in the summer and was sick for about 4 weeks.  Current Outpatient Prescriptions on File Prior to Visit  Medication Sig Dispense Refill  . carvedilol (COREG) 25 MG tablet Take 1 tablet (25 mg total) by mouth 2 (two) times daily with a meal.  60 tablet  5  . furosemide (LASIX) 20 MG tablet Take 1 tablet (20 mg total) by mouth daily.  30 tablet  6  . Multiple Vitamin (MULTI-VITAMIN PO) Take by mouth daily.        . quinapril (ACCUPRIL) 20 MG tablet Take 0.5 tablets (10 mg total) by mouth daily. 1/2 daily  30 tablet  6  . THYROID PO Take by mouth daily. Alternate 300/200 mg daily      . warfarin (COUMADIN) 5 MG tablet Take as directed by anticoagulation clinic  100 tablet  1    No Known Allergies  Past Medical History  Diagnosis Date  . Dilated cardiomyopathy     with ejection fraction of 30-35%  . Atrial fibrillation   . Hypertension   . Hypothyroidism   . Lightheadedness   . History of shingles     Past Surgical History  Procedure Date  . Cataract extraction   . Vein ligation   . Thyroidectomy   . Foot surgery   . Tonsillectomy   . Appendectomy   . Cesarean section     History  Smoking status  . Never Smoker   Smokeless tobacco  . Not on file    History  Alcohol Use No    Family History  Problem Relation Age of Onset  . Hypertension Mother   . Heart attack Father     Review of Systems:  All other systems  as noted in history of present illness. All other systems were reviewed and are negative.  Physical Exam: BP 140/70  Pulse 59  Ht 5\' 6"  (1.676 m)  Wt 140 lb 1.9 oz (63.558 kg)  BMI 22.62  kg/m2 She is a pleasant elderly white female in no acute distress. She is normocephalic, atraumatic. Pupils are equal round and reactive to light and accommodation. Oropharynx is clear. Neck is supple no JVD, adenopathy, thyromegaly, or bruits. Lungs are clear. Cardiac exam reveals a grade 1-2/6 systolic murmur at apex. There is no S3. Abdomen is soft and nontender without masses. She has no significant edema. She does have diffuse superficial varicosities in her lower  extremities.  pedal pulses are palpable. Neurologic exam is nonfocal. LABORATORY DATA:  INR today is 2.4. ECG today demonstrates atrial fibrillation with a controlled rate of 60 beats per minute. There are occasional PVCs. There is evidence of old septal and lateral infarct.  Assessment / Plan:

## 2011-08-12 NOTE — Patient Instructions (Signed)
Continue your current medication.  I will see you again in 6 months.   

## 2011-08-12 NOTE — Assessment & Plan Note (Signed)
Blood pressure control today is satisfactory.

## 2011-09-23 ENCOUNTER — Ambulatory Visit (INDEPENDENT_AMBULATORY_CARE_PROVIDER_SITE_OTHER): Payer: Medicare Other | Admitting: Pharmacist

## 2011-09-23 DIAGNOSIS — Z7901 Long term (current) use of anticoagulants: Secondary | ICD-10-CM

## 2011-09-23 DIAGNOSIS — I4891 Unspecified atrial fibrillation: Secondary | ICD-10-CM

## 2011-09-23 LAB — POCT INR: INR: 4.1

## 2011-10-07 ENCOUNTER — Telehealth: Payer: Self-pay | Admitting: Cardiology

## 2011-10-07 ENCOUNTER — Ambulatory Visit (INDEPENDENT_AMBULATORY_CARE_PROVIDER_SITE_OTHER): Payer: Medicare Other | Admitting: *Deleted

## 2011-10-07 DIAGNOSIS — I4891 Unspecified atrial fibrillation: Secondary | ICD-10-CM

## 2011-10-07 DIAGNOSIS — Z7901 Long term (current) use of anticoagulants: Secondary | ICD-10-CM

## 2011-10-07 LAB — POCT INR: INR: 2.3

## 2011-10-07 NOTE — Telephone Encounter (Signed)
New msg Pt called to say she has blood clot in her eye and she had a small stroke. She is seeing Dr Luciana Axe tomorrow. She wanted you to know

## 2011-10-08 NOTE — Telephone Encounter (Signed)
Dr.Jordan was told of patient's blood clot in her rt eye.

## 2011-11-04 ENCOUNTER — Ambulatory Visit (INDEPENDENT_AMBULATORY_CARE_PROVIDER_SITE_OTHER): Payer: Medicare Other | Admitting: *Deleted

## 2011-11-04 DIAGNOSIS — Z7901 Long term (current) use of anticoagulants: Secondary | ICD-10-CM

## 2011-11-04 DIAGNOSIS — I4891 Unspecified atrial fibrillation: Secondary | ICD-10-CM

## 2011-11-04 LAB — POCT INR: INR: 2.8

## 2011-12-02 ENCOUNTER — Ambulatory Visit (INDEPENDENT_AMBULATORY_CARE_PROVIDER_SITE_OTHER): Payer: Medicare Other | Admitting: *Deleted

## 2011-12-02 DIAGNOSIS — Z7901 Long term (current) use of anticoagulants: Secondary | ICD-10-CM

## 2011-12-02 DIAGNOSIS — I4891 Unspecified atrial fibrillation: Secondary | ICD-10-CM

## 2011-12-02 LAB — POCT INR: INR: 2.2

## 2011-12-08 ENCOUNTER — Telehealth: Payer: Self-pay | Admitting: Cardiology

## 2011-12-08 MED ORDER — WARFARIN SODIUM 5 MG PO TABS: ORAL_TABLET | ORAL | Status: DC

## 2011-12-08 NOTE — Telephone Encounter (Signed)
New Problem:    Patient called in needing a refill of 100 tablets of her warfarin (COUMADIN) 5 MG tablet filled with the pharmacy listed on her profile.

## 2011-12-10 ENCOUNTER — Telehealth: Payer: Self-pay | Admitting: Cardiology

## 2011-12-10 NOTE — Telephone Encounter (Signed)
Patient called stated she just started on amoxicillin 875 mg twice daily for 14 days.Message sent to coumadin clinic.

## 2011-12-10 NOTE — Telephone Encounter (Signed)
New msg Pt went to pcp and was put on antibiotics. She wanted to talk to you

## 2011-12-11 NOTE — Telephone Encounter (Signed)
Spoke with patient and informed her she can continue her amoxicillin as prescribed, it does not interfere with her INR/coumadin management. She understands.

## 2011-12-30 ENCOUNTER — Other Ambulatory Visit: Payer: Self-pay

## 2011-12-30 ENCOUNTER — Telehealth: Payer: Self-pay | Admitting: Cardiology

## 2011-12-30 MED ORDER — CARVEDILOL 25 MG PO TABS: 25.0000 mg | ORAL_TABLET | Freq: Two times a day (BID) | ORAL | Status: DC

## 2011-12-30 NOTE — Telephone Encounter (Signed)
New Problem:    Patient called in needing a 90 day refill of her carvedilol (COREG) 25 MG tablet.  Please call back once the order has been placed.

## 2012-01-05 ENCOUNTER — Ambulatory Visit (INDEPENDENT_AMBULATORY_CARE_PROVIDER_SITE_OTHER): Payer: Medicare Other

## 2012-01-05 DIAGNOSIS — Z7901 Long term (current) use of anticoagulants: Secondary | ICD-10-CM

## 2012-01-05 DIAGNOSIS — I4891 Unspecified atrial fibrillation: Secondary | ICD-10-CM

## 2012-02-16 ENCOUNTER — Ambulatory Visit (INDEPENDENT_AMBULATORY_CARE_PROVIDER_SITE_OTHER): Payer: Medicare Other | Admitting: Pharmacist

## 2012-02-16 DIAGNOSIS — I4891 Unspecified atrial fibrillation: Secondary | ICD-10-CM

## 2012-02-16 DIAGNOSIS — Z7901 Long term (current) use of anticoagulants: Secondary | ICD-10-CM

## 2012-02-16 LAB — POCT INR: INR: 2.3

## 2012-04-05 ENCOUNTER — Ambulatory Visit (INDEPENDENT_AMBULATORY_CARE_PROVIDER_SITE_OTHER): Payer: Medicare Other | Admitting: Pharmacist

## 2012-04-05 ENCOUNTER — Encounter: Payer: Self-pay | Admitting: Cardiology

## 2012-04-05 ENCOUNTER — Ambulatory Visit (INDEPENDENT_AMBULATORY_CARE_PROVIDER_SITE_OTHER): Payer: Medicare Other | Admitting: Cardiology

## 2012-04-05 VITALS — BP 118/82 | HR 62 | Ht 66.0 in | Wt 146.0 lb

## 2012-04-05 DIAGNOSIS — I428 Other cardiomyopathies: Secondary | ICD-10-CM

## 2012-04-05 DIAGNOSIS — Z7901 Long term (current) use of anticoagulants: Secondary | ICD-10-CM

## 2012-04-05 DIAGNOSIS — I5022 Chronic systolic (congestive) heart failure: Secondary | ICD-10-CM

## 2012-04-05 DIAGNOSIS — I420 Dilated cardiomyopathy: Secondary | ICD-10-CM

## 2012-04-05 DIAGNOSIS — I4891 Unspecified atrial fibrillation: Secondary | ICD-10-CM

## 2012-04-05 DIAGNOSIS — I10 Essential (primary) hypertension: Secondary | ICD-10-CM

## 2012-04-05 DIAGNOSIS — I509 Heart failure, unspecified: Secondary | ICD-10-CM

## 2012-04-05 HISTORY — DX: Chronic systolic (congestive) heart failure: I50.22

## 2012-04-05 LAB — POCT INR: INR: 3

## 2012-04-05 NOTE — Progress Notes (Signed)
Gabriela Hernandez Date of Birth: 24-Sep-1926   History of Present Illness: Gabriela Hernandez is seen today for followup of congestive heart failure. She has a history of dilated cardiomyopathy and chronic atrial fibrillation. She reports that she is doing  well.  She does note that she gets really tired when she walks and has some shortness of breath. She's had no change in her lower extremity edema. She does complain that her legs tingle. Sometimes when she takes her Lasix in the morning she gets lightheaded so she often waits until after she has taken her morning walk to take it. Her weights at home have been stable.  Current Outpatient Prescriptions on File Prior to Visit  Medication Sig Dispense Refill  . carvedilol (COREG) 25 MG tablet Take 1 tablet (25 mg total) by mouth 2 (two) times daily with a meal.  180 tablet  5  . furosemide (LASIX) 20 MG tablet Take 1 tablet (20 mg total) by mouth daily.  30 tablet  6  . Multiple Vitamin (MULTI-VITAMIN PO) Take by mouth daily.        . quinapril (ACCUPRIL) 20 MG tablet Take 0.5 tablets (10 mg total) by mouth daily. 1/2 daily  30 tablet  6  . THYROID PO Take by mouth daily. Alternate 300/200 mg daily      . warfarin (COUMADIN) 5 MG tablet Take as directed by anticoagulation clinic  100 tablet  1    No Known Allergies  Past Medical History  Diagnosis Date  . Dilated cardiomyopathy     with ejection fraction of 30-35%  . Atrial fibrillation   . Hypertension   . Hypothyroidism   . Lightheadedness   . History of shingles   . Chronic systolic CHF (congestive heart failure) 04/05/2012    Past Surgical History  Procedure Date  . Cataract extraction   . Vein ligation   . Thyroidectomy   . Foot surgery   . Tonsillectomy   . Appendectomy   . Cesarean section     History  Smoking status  . Never Smoker   Smokeless tobacco  . Not on file    History  Alcohol Use No    Family History  Problem Relation Age of Onset  . Hypertension  Mother   . Heart attack Father     Review of Systems:  All other systems  as noted in history of present illness. She does have macular degeneration with declining vision. She has been receiving shots from her ophthalmologist. All other systems were reviewed and are negative.  Physical Exam: BP 118/82  Pulse 62  Ht 5\' 6"  (1.676 m)  Wt 66.225 kg (146 lb)  BMI 23.56 kg/m2 She is a pleasant elderly white female in no acute distress. She is normocephalic, atraumatic. Pupils are equal round and reactive to light and accommodation. Oropharynx is clear. Neck is supple no JVD, adenopathy, thyromegaly, or bruits. Lungs are clear. Cardiac exam reveals a grade 1-2/6 systolic murmur at apex. Pulse is irregular. There is no S3. Abdomen is soft and nontender without masses. She has no significant edema. She does have diffuse superficial varicosities in her lower  extremities.  Pedal pulses are palpable. Neurologic exam is nonfocal. LABORATORY DATA:  INR today is pending  Assessment / Plan: 1. Chronic congestive heart failure with ejection fraction 30-35% secondary to nonischemic cardiomyopathy. She appears to be fairly well compensated today. I recommended continuing her current medical therapy. If she has increased swelling or weight gain she  knows to take an extra Lasix. Continue sodium restriction. 2. Atrial fibrillation. Rate is well controlled on carvedilol. Continue Coumadin for anticoagulation. 3. Hypertension, controlled.

## 2012-04-05 NOTE — Patient Instructions (Addendum)
Continue your current medication.   You may take an extra Lasix if needed for weight gain or increased swelling  Have Dr. Clovis Riley send a copy of your lab work.  I will see you in 6 months.

## 2012-05-17 ENCOUNTER — Ambulatory Visit (INDEPENDENT_AMBULATORY_CARE_PROVIDER_SITE_OTHER): Payer: Medicare Other | Admitting: *Deleted

## 2012-05-17 DIAGNOSIS — Z7901 Long term (current) use of anticoagulants: Secondary | ICD-10-CM

## 2012-05-17 DIAGNOSIS — I4891 Unspecified atrial fibrillation: Secondary | ICD-10-CM

## 2012-05-17 LAB — POCT INR: INR: 3.9

## 2012-05-30 ENCOUNTER — Telehealth: Payer: Self-pay | Admitting: Cardiology

## 2012-05-30 MED ORDER — WARFARIN SODIUM 5 MG PO TABS: ORAL_TABLET | ORAL | Status: DC

## 2012-05-30 NOTE — Telephone Encounter (Signed)
New Problem:    Patient called in needing a 100 tablet refill of her warfarin (COUMADIN) 5 MG tablet.

## 2012-05-31 ENCOUNTER — Ambulatory Visit (INDEPENDENT_AMBULATORY_CARE_PROVIDER_SITE_OTHER): Payer: Medicare Other | Admitting: *Deleted

## 2012-05-31 DIAGNOSIS — Z7901 Long term (current) use of anticoagulants: Secondary | ICD-10-CM

## 2012-05-31 DIAGNOSIS — I4891 Unspecified atrial fibrillation: Secondary | ICD-10-CM

## 2012-05-31 LAB — POCT INR: INR: 2.3

## 2012-06-06 ENCOUNTER — Ambulatory Visit (INDEPENDENT_AMBULATORY_CARE_PROVIDER_SITE_OTHER): Payer: Medicare Other | Admitting: Pharmacist

## 2012-06-06 DIAGNOSIS — I4891 Unspecified atrial fibrillation: Secondary | ICD-10-CM

## 2012-06-06 DIAGNOSIS — Z7901 Long term (current) use of anticoagulants: Secondary | ICD-10-CM

## 2012-06-06 LAB — POCT INR: INR: 2

## 2012-06-24 ENCOUNTER — Telehealth: Payer: Self-pay | Admitting: Cardiology

## 2012-06-24 MED ORDER — WARFARIN SODIUM 5 MG PO TABS: ORAL_TABLET | ORAL | Status: DC

## 2012-06-24 MED ORDER — QUINAPRIL HCL 20 MG PO TABS: 10.0000 mg | ORAL_TABLET | Freq: Every day | ORAL | Status: DC

## 2012-06-24 MED ORDER — FUROSEMIDE 20 MG PO TABS: 20.0000 mg | ORAL_TABLET | Freq: Every day | ORAL | Status: DC

## 2012-06-24 NOTE — Telephone Encounter (Signed)
Spoke to patient she stated she wants warfarin refill and she wants # 100 tablets.States she also wants the directions on the bottle.States she takes warfarin 5 mg every day except 7.5 mg on tue&fri.Message sent to coumadin clinic.

## 2012-06-24 NOTE — Telephone Encounter (Signed)
Pt calling re problem with medication, pls call [phone]

## 2012-06-24 NOTE — Telephone Encounter (Signed)
Called and talked with pt regarding refill and reason that order on her coumadin bottle is Take as directed  By coumadin clinic and she states understanding. Refill done as requested

## 2012-07-04 ENCOUNTER — Ambulatory Visit (INDEPENDENT_AMBULATORY_CARE_PROVIDER_SITE_OTHER): Payer: Medicare Other | Admitting: *Deleted

## 2012-07-04 DIAGNOSIS — I4891 Unspecified atrial fibrillation: Secondary | ICD-10-CM

## 2012-07-04 DIAGNOSIS — Z7901 Long term (current) use of anticoagulants: Secondary | ICD-10-CM

## 2012-07-21 ENCOUNTER — Ambulatory Visit (INDEPENDENT_AMBULATORY_CARE_PROVIDER_SITE_OTHER): Payer: Medicare Other

## 2012-07-21 DIAGNOSIS — I4891 Unspecified atrial fibrillation: Secondary | ICD-10-CM

## 2012-07-21 DIAGNOSIS — Z7901 Long term (current) use of anticoagulants: Secondary | ICD-10-CM

## 2012-07-21 NOTE — Telephone Encounter (Signed)
Spoke to patient she stated she has had nose bleeds for the past 2 days.Patient advised may need INR checked.Message sent to coumadin clinic.

## 2012-07-21 NOTE — Telephone Encounter (Signed)
Patient coming in today for INR check, has had a nose bleed for 2 days.

## 2012-07-21 NOTE — Telephone Encounter (Signed)
New problem    C/O nose bleeds for couples of days.

## 2012-08-18 ENCOUNTER — Ambulatory Visit (INDEPENDENT_AMBULATORY_CARE_PROVIDER_SITE_OTHER): Payer: Medicare Other | Admitting: *Deleted

## 2012-08-18 DIAGNOSIS — Z7901 Long term (current) use of anticoagulants: Secondary | ICD-10-CM

## 2012-08-18 DIAGNOSIS — I4891 Unspecified atrial fibrillation: Secondary | ICD-10-CM

## 2012-08-24 ENCOUNTER — Ambulatory Visit (INDEPENDENT_AMBULATORY_CARE_PROVIDER_SITE_OTHER): Payer: Medicare Other | Admitting: Pharmacist

## 2012-08-24 DIAGNOSIS — I4891 Unspecified atrial fibrillation: Secondary | ICD-10-CM

## 2012-08-24 DIAGNOSIS — Z7901 Long term (current) use of anticoagulants: Secondary | ICD-10-CM

## 2012-08-24 LAB — POCT INR: INR: 1.7

## 2012-09-08 ENCOUNTER — Ambulatory Visit (INDEPENDENT_AMBULATORY_CARE_PROVIDER_SITE_OTHER): Payer: Medicare Other

## 2012-09-08 DIAGNOSIS — I4891 Unspecified atrial fibrillation: Secondary | ICD-10-CM

## 2012-09-08 DIAGNOSIS — Z7901 Long term (current) use of anticoagulants: Secondary | ICD-10-CM

## 2012-09-08 LAB — POCT INR: INR: 2.5

## 2012-10-03 ENCOUNTER — Encounter: Payer: Self-pay | Admitting: Cardiology

## 2012-10-03 ENCOUNTER — Ambulatory Visit (INDEPENDENT_AMBULATORY_CARE_PROVIDER_SITE_OTHER): Payer: Medicare Other | Admitting: Cardiology

## 2012-10-03 ENCOUNTER — Ambulatory Visit (INDEPENDENT_AMBULATORY_CARE_PROVIDER_SITE_OTHER): Payer: Medicare Other | Admitting: *Deleted

## 2012-10-03 VITALS — BP 140/84 | HR 54 | Ht 66.0 in | Wt 143.1 lb

## 2012-10-03 DIAGNOSIS — R5383 Other fatigue: Secondary | ICD-10-CM

## 2012-10-03 DIAGNOSIS — I509 Heart failure, unspecified: Secondary | ICD-10-CM

## 2012-10-03 DIAGNOSIS — Z7901 Long term (current) use of anticoagulants: Secondary | ICD-10-CM

## 2012-10-03 DIAGNOSIS — I5022 Chronic systolic (congestive) heart failure: Secondary | ICD-10-CM

## 2012-10-03 DIAGNOSIS — I4891 Unspecified atrial fibrillation: Secondary | ICD-10-CM

## 2012-10-03 DIAGNOSIS — I428 Other cardiomyopathies: Secondary | ICD-10-CM

## 2012-10-03 DIAGNOSIS — R5381 Other malaise: Secondary | ICD-10-CM

## 2012-10-03 DIAGNOSIS — I420 Dilated cardiomyopathy: Secondary | ICD-10-CM

## 2012-10-03 DIAGNOSIS — I10 Essential (primary) hypertension: Secondary | ICD-10-CM

## 2012-10-03 LAB — POCT INR: INR: 2.5

## 2012-10-03 MED ORDER — CARVEDILOL 25 MG PO TABS: 12.5000 mg | ORAL_TABLET | Freq: Two times a day (BID) | ORAL | Status: DC

## 2012-10-03 NOTE — Patient Instructions (Signed)
Reduce carvedilol to 12.5 mg twice a day.  Continue your other therapy  I will see you in 6 months.

## 2012-10-03 NOTE — Progress Notes (Signed)
Gabriela Hernandez Date of Birth: 12-21-1926   History of Present Illness: Gabriela Hernandez is seen today for followup of congestive heart failure. She has a history of dilated cardiomyopathy and chronic atrial fibrillation. She complains that when she does any activity she gets short of breath just gives out. She was treated for urinary tract infection 2 months ago and treatment the affected her INR. It is now back to normal. She reports that her thyroid dose has been adjusted. She denies any increase in edema. Her weight is extra down 3 pounds. She has no orthopnea or PND.  Current Outpatient Prescriptions on File Prior to Visit  Medication Sig Dispense Refill  . furosemide (LASIX) 20 MG tablet Take 1 tablet (20 mg total) by mouth daily.  90 tablet  36  . Multiple Vitamin (MULTI-VITAMIN PO) Take by mouth daily.        . quinapril (ACCUPRIL) 20 MG tablet Take 0.5 tablets (10 mg total) by mouth daily. 1/2 daily  90 tablet  3  . warfarin (COUMADIN) 5 MG tablet Take as directed by anticoagulation clinic  120 tablet  1   No current facility-administered medications on file prior to visit.    No Known Allergies  Past Medical History  Diagnosis Date  . Dilated cardiomyopathy     with ejection fraction of 30-35%  . Atrial fibrillation   . Hypertension   . Hypothyroidism   . Lightheadedness   . History of shingles   . Chronic systolic CHF (congestive heart failure) 04/05/2012    Past Surgical History  Procedure Laterality Date  . Cataract extraction    . Vein ligation    . Thyroidectomy    . Foot surgery    . Tonsillectomy    . Appendectomy    . Cesarean section      History  Smoking status  . Never Smoker   Smokeless tobacco  . Not on file    History  Alcohol Use No    Family History  Problem Relation Age of Onset  . Hypertension Mother   . Heart attack Father     Review of Systems:  All other systems  as noted in history of present illness. All other systems were  reviewed and are negative.  Physical Exam: BP 140/84  Pulse 54  Ht 5\' 6"  (1.676 m)  Wt 143 lb 1.9 oz (64.919 kg)  BMI 23.11 kg/m2 She is a pleasant elderly white female in no acute distress. HEENT exam is unremarkable.  Neck is supple no JVD, adenopathy, thyromegaly, or bruits. Lungs are clear. Cardiac exam reveals a grade 1-2/6 systolic murmur at apex. Pulse is irregular. There is no S3. Abdomen is soft and nontender without masses. She has no significant edema. She does have diffuse superficial varicosities in her lower  extremities. There is trace edema.  Pedal pulses are palpable. Neurologic exam is nonfocal. LABORATORY DATA:  INR today is 2.5 ECG demonstrates atrial fibrillation with a rate of 58 beats per minute. There is a left anterior fascicular block. There is evidence of old anteroseptal infarct. There ST-T wave changes consistent with lateral ischemia. These findings are unchanged from March 2013.  Assessment / Plan: 1. Chronic congestive heart failure with ejection fraction 30-35% secondary to nonischemic cardiomyopathy. She appears to be fairly well compensated today.Continue sodium restriction. Continue current dose of ACE inhibitor and Lasix. I requested a copy of her most recent lab work with Gabriela Hernandez. 2. Atrial fibrillation. Rate is slow on carvedilol.  I think her slow heart rate may be contributing to her symptoms of fatigue and exercise intolerance. I recommended reducing her carvedilol to 12.5 mg twice a day. Continue Coumadin for anticoagulation. 3. Hypertension, controlled.

## 2012-10-31 ENCOUNTER — Ambulatory Visit (INDEPENDENT_AMBULATORY_CARE_PROVIDER_SITE_OTHER): Payer: Medicare Other

## 2012-10-31 DIAGNOSIS — Z7901 Long term (current) use of anticoagulants: Secondary | ICD-10-CM

## 2012-10-31 DIAGNOSIS — I4891 Unspecified atrial fibrillation: Secondary | ICD-10-CM

## 2012-11-21 ENCOUNTER — Ambulatory Visit (INDEPENDENT_AMBULATORY_CARE_PROVIDER_SITE_OTHER): Payer: Medicare Other | Admitting: *Deleted

## 2012-11-21 DIAGNOSIS — Z7901 Long term (current) use of anticoagulants: Secondary | ICD-10-CM

## 2012-11-21 DIAGNOSIS — I4891 Unspecified atrial fibrillation: Secondary | ICD-10-CM

## 2012-12-19 ENCOUNTER — Ambulatory Visit (INDEPENDENT_AMBULATORY_CARE_PROVIDER_SITE_OTHER): Payer: Medicare Other | Admitting: *Deleted

## 2012-12-19 DIAGNOSIS — I4891 Unspecified atrial fibrillation: Secondary | ICD-10-CM

## 2012-12-19 DIAGNOSIS — Z7901 Long term (current) use of anticoagulants: Secondary | ICD-10-CM

## 2013-01-09 ENCOUNTER — Ambulatory Visit (INDEPENDENT_AMBULATORY_CARE_PROVIDER_SITE_OTHER): Payer: Medicare Other | Admitting: *Deleted

## 2013-01-09 DIAGNOSIS — Z7901 Long term (current) use of anticoagulants: Secondary | ICD-10-CM

## 2013-01-09 DIAGNOSIS — I4891 Unspecified atrial fibrillation: Secondary | ICD-10-CM

## 2013-01-30 ENCOUNTER — Ambulatory Visit (INDEPENDENT_AMBULATORY_CARE_PROVIDER_SITE_OTHER): Payer: Medicare Other

## 2013-01-30 DIAGNOSIS — I4891 Unspecified atrial fibrillation: Secondary | ICD-10-CM

## 2013-01-30 DIAGNOSIS — Z7901 Long term (current) use of anticoagulants: Secondary | ICD-10-CM

## 2013-01-30 MED ORDER — WARFARIN SODIUM 5 MG PO TABS: ORAL_TABLET | ORAL | Status: DC

## 2013-02-16 ENCOUNTER — Ambulatory Visit (INDEPENDENT_AMBULATORY_CARE_PROVIDER_SITE_OTHER): Payer: Medicare Other | Admitting: Cardiology

## 2013-02-16 ENCOUNTER — Encounter: Payer: Self-pay | Admitting: Cardiology

## 2013-02-16 ENCOUNTER — Telehealth: Payer: Self-pay | Admitting: Cardiology

## 2013-02-16 VITALS — BP 122/73 | HR 62 | Ht 66.0 in | Wt 139.0 lb

## 2013-02-16 DIAGNOSIS — I420 Dilated cardiomyopathy: Secondary | ICD-10-CM

## 2013-02-16 DIAGNOSIS — I4891 Unspecified atrial fibrillation: Secondary | ICD-10-CM

## 2013-02-16 DIAGNOSIS — I428 Other cardiomyopathies: Secondary | ICD-10-CM

## 2013-02-16 DIAGNOSIS — I509 Heart failure, unspecified: Secondary | ICD-10-CM

## 2013-02-16 DIAGNOSIS — R0609 Other forms of dyspnea: Secondary | ICD-10-CM

## 2013-02-16 DIAGNOSIS — R0600 Dyspnea, unspecified: Secondary | ICD-10-CM

## 2013-02-16 DIAGNOSIS — I5022 Chronic systolic (congestive) heart failure: Secondary | ICD-10-CM

## 2013-02-16 NOTE — Progress Notes (Signed)
Gabriela Hernandez Date of Birth: 30-Jul-1926   History of Present Illness: Gabriela Hernandez is seen today as a work in. She had called today complaining of increased shortness of breath particularly with exertion. She has increased fatigue. She denies any increase in edema and has actually lost 4 pounds. She has a history of dilated cardiomyopathy and chronic atrial fibrillation. She reports her thyroid dose has been adjusted. She has noticed no bleeding problems and her Coumadin has been therapeutic.  Current Outpatient Prescriptions on File Prior to Visit  Medication Sig Dispense Refill  . carvedilol (COREG) 25 MG tablet Take 0.5 tablets (12.5 mg total) by mouth 2 (two) times daily with a meal.  180 tablet  5  . furosemide (LASIX) 20 MG tablet Take 1 tablet (20 mg total) by mouth daily.  90 tablet  36  . levothyroxine (SYNTHROID, LEVOTHROID) 200 MCG tablet Take 200 mcg by mouth as directed. 300 MG daily fives days a week and 200 MG two days a week.      . Multiple Vitamin (MULTI-VITAMIN PO) Take by mouth daily.        . quinapril (ACCUPRIL) 20 MG tablet Take 0.5 tablets (10 mg total) by mouth daily. 1/2 daily  90 tablet  3  . warfarin (COUMADIN) 5 MG tablet Take as directed by anticoagulation clinic  120 tablet  1   No current facility-administered medications on file prior to visit.    No Known Allergies  Past Medical History  Diagnosis Date  . Dilated cardiomyopathy     with ejection fraction of 30-35%  . Atrial fibrillation   . Hypertension   . Hypothyroidism   . Lightheadedness   . History of shingles   . Chronic systolic CHF (congestive heart failure) 04/05/2012    Past Surgical History  Procedure Laterality Date  . Cataract extraction    . Vein ligation    . Thyroidectomy    . Foot surgery    . Tonsillectomy    . Appendectomy    . Cesarean section      History  Smoking status  . Never Smoker   Smokeless tobacco  . Not on file    History  Alcohol Use No     Family History  Problem Relation Age of Onset  . Hypertension Mother   . Heart attack Father     Review of Systems:  All other systems  as noted in history of present illness. All other systems were reviewed and are negative.  Physical Exam: BP 122/73  Pulse 62  Ht 5\' 6"  (1.676 m)  Wt 139 lb (63.05 kg)  BMI 22.45 kg/m2 She is a pleasant elderly white female in no acute distress. HEENT exam is unremarkable.  Neck is supple no JVD, adenopathy, thyromegaly, or bruits. Lungs are clear. Cardiac exam reveals a grade 1-2/6 systolic murmur at apex. Pulse is irregular. There is no S3. Abdomen is soft and nontender without masses. She has 1+ edema. She does have diffuse superficial varicosities in her lower  extremities.   Pedal pulses are palpable. Neurologic exam is nonfocal.  LABORATORY DATA:  INR  Recently was 2.8 ECG demonstrates atrial fibrillation with a rate of 62 beats per minute. There is a left anterior fascicular block. There is evidence of old anteroseptal infarct.   Assessment / Plan: 1. Chronic congestive heart failure with ejection fraction 30-35% secondary to nonischemic cardiomyopathy.  clinically  appears to be fairly well compensated today and weight has decreased. however she  continues to have significant dyspnea. Continue sodium restriction. I will check baseline lab work today including CBC, basic metabolic panel, BNP level, and TSH. We will also update her echocardiogram. I'll see her back in November. We will continue her current diuretic dose for now until we have more information.   2. Atrial fibrillation. Rate is well controlled now. We had previously reduced her carvedilol dose because of bradycardia and this has improved.   3. Hypertension, controlled.

## 2013-02-16 NOTE — Telephone Encounter (Signed)
Pt states she has increase in SOB and fatigue for 3 weeks. Pt's weight was 139 lbs today and is usually 140-142lbs. Pt states she had some lower extremity edema on Monday but not today. She is unsure of her heart rate. I reviewed with Dr [place] and have scheduled her to see Dr [place] today at 3:30PM.

## 2013-02-16 NOTE — Telephone Encounter (Signed)
Pt having trouble breathing, tired, pls advise [phone]

## 2013-02-16 NOTE — Patient Instructions (Addendum)
We will check lab work (CBC, BMP, TSH, BNP)  today and schedule you for an echocardiogram.  Continue your medication for now.  Keep your scheduled appointment with me in November.

## 2013-02-17 LAB — CBC WITH DIFFERENTIAL/PLATELET
Basophils Absolute: 0 10*3/uL (ref 0.0–0.1)
Eosinophils Absolute: 0.1 10*3/uL (ref 0.0–0.7)
HCT: 39.7 % (ref 36.0–46.0)
Hemoglobin: 13.4 g/dL (ref 12.0–15.0)
Lymphs Abs: 1.5 10*3/uL (ref 0.7–4.0)
MCHC: 33.9 g/dL (ref 30.0–36.0)
MCV: 88.8 fl (ref 78.0–100.0)
Monocytes Absolute: 0.4 10*3/uL (ref 0.1–1.0)
Neutro Abs: 2.7 10*3/uL (ref 1.4–7.7)
Platelets: 127 10*3/uL — ABNORMAL LOW (ref 150.0–400.0)
RDW: 17.8 % — ABNORMAL HIGH (ref 11.5–14.6)

## 2013-02-17 LAB — BASIC METABOLIC PANEL
BUN: 11 mg/dL (ref 6–23)
CO2: 34 mEq/L — ABNORMAL HIGH (ref 19–32)
Glucose, Bld: 92 mg/dL (ref 70–99)
Potassium: 4.7 mEq/L (ref 3.5–5.1)
Sodium: 136 mEq/L (ref 135–145)

## 2013-02-17 LAB — TSH: TSH: 1.76 u[IU]/mL (ref 0.35–5.50)

## 2013-02-17 LAB — BRAIN NATRIURETIC PEPTIDE: Pro B Natriuretic peptide (BNP): 96 pg/mL (ref 0.0–100.0)

## 2013-02-27 ENCOUNTER — Ambulatory Visit (INDEPENDENT_AMBULATORY_CARE_PROVIDER_SITE_OTHER): Payer: Medicare Other | Admitting: *Deleted

## 2013-02-27 DIAGNOSIS — I4891 Unspecified atrial fibrillation: Secondary | ICD-10-CM

## 2013-02-27 DIAGNOSIS — Z7901 Long term (current) use of anticoagulants: Secondary | ICD-10-CM

## 2013-02-27 LAB — POCT INR: INR: 2.5

## 2013-03-08 ENCOUNTER — Ambulatory Visit (HOSPITAL_COMMUNITY): Payer: Medicare Other | Attending: Cardiology | Admitting: Radiology

## 2013-03-08 DIAGNOSIS — I10 Essential (primary) hypertension: Secondary | ICD-10-CM | POA: Insufficient documentation

## 2013-03-08 DIAGNOSIS — R0600 Dyspnea, unspecified: Secondary | ICD-10-CM

## 2013-03-08 DIAGNOSIS — R0609 Other forms of dyspnea: Secondary | ICD-10-CM | POA: Insufficient documentation

## 2013-03-08 DIAGNOSIS — R5381 Other malaise: Secondary | ICD-10-CM | POA: Insufficient documentation

## 2013-03-08 DIAGNOSIS — I509 Heart failure, unspecified: Secondary | ICD-10-CM | POA: Insufficient documentation

## 2013-03-08 DIAGNOSIS — R0989 Other specified symptoms and signs involving the circulatory and respiratory systems: Secondary | ICD-10-CM | POA: Insufficient documentation

## 2013-03-08 DIAGNOSIS — I4891 Unspecified atrial fibrillation: Secondary | ICD-10-CM | POA: Insufficient documentation

## 2013-03-08 DIAGNOSIS — I5022 Chronic systolic (congestive) heart failure: Secondary | ICD-10-CM

## 2013-03-08 DIAGNOSIS — I420 Dilated cardiomyopathy: Secondary | ICD-10-CM

## 2013-03-08 DIAGNOSIS — I428 Other cardiomyopathies: Secondary | ICD-10-CM | POA: Insufficient documentation

## 2013-03-08 NOTE — Progress Notes (Signed)
Echocardiogram performed.  

## 2013-04-05 ENCOUNTER — Encounter: Payer: Self-pay | Admitting: Cardiology

## 2013-04-05 ENCOUNTER — Ambulatory Visit (INDEPENDENT_AMBULATORY_CARE_PROVIDER_SITE_OTHER): Payer: Medicare Other | Admitting: Cardiology

## 2013-04-05 ENCOUNTER — Ambulatory Visit (INDEPENDENT_AMBULATORY_CARE_PROVIDER_SITE_OTHER): Payer: Medicare Other | Admitting: *Deleted

## 2013-04-05 VITALS — BP 122/64 | HR 52 | Ht 66.0 in | Wt 139.0 lb

## 2013-04-05 DIAGNOSIS — Z7901 Long term (current) use of anticoagulants: Secondary | ICD-10-CM

## 2013-04-05 DIAGNOSIS — I420 Dilated cardiomyopathy: Secondary | ICD-10-CM

## 2013-04-05 DIAGNOSIS — I4891 Unspecified atrial fibrillation: Secondary | ICD-10-CM

## 2013-04-05 DIAGNOSIS — I509 Heart failure, unspecified: Secondary | ICD-10-CM

## 2013-04-05 DIAGNOSIS — I5022 Chronic systolic (congestive) heart failure: Secondary | ICD-10-CM

## 2013-04-05 DIAGNOSIS — I428 Other cardiomyopathies: Secondary | ICD-10-CM

## 2013-04-05 LAB — POCT INR: INR: 3

## 2013-04-05 NOTE — Progress Notes (Signed)
Gabriela Hernandez Date of Birth: Jan 07, 1927   History of Present Illness: Gabriela Hernandez is seen today for followup of her CHF. She has a history of nonischemic cardiomyopathy with prior echo demonstrated an ejection fraction of 30-35%. We saw her in October with complaints of increased shortness of breath. Laboratory data was unremarkable including a normal BNP level. We  repeated her echocardiogram and this showed significant improvement in her ejection fraction to 55-60%. She had moderate mitral insufficiency. On followup today she states her breathing is doing okay. She does complain that her legs give out easily. She always feels better when she is able to walk in the morning.  Current Outpatient Prescriptions on File Prior to Visit  Medication Sig Dispense Refill  . carvedilol (COREG) 25 MG tablet Take 0.5 tablets (12.5 mg total) by mouth 2 (two) times daily with a meal.  180 tablet  5  . furosemide (LASIX) 20 MG tablet Take 1 tablet (20 mg total) by mouth daily.  90 tablet  36  . levothyroxine (SYNTHROID, LEVOTHROID) 200 MCG tablet Take 200 mcg by mouth as directed. 300 MG daily fives days a week and 200 MG two days a week.      . Multiple Vitamin (MULTI-VITAMIN PO) Take by mouth daily.        . quinapril (ACCUPRIL) 20 MG tablet Take 0.5 tablets (10 mg total) by mouth daily. 1/2 daily  90 tablet  3  . warfarin (COUMADIN) 5 MG tablet Take as directed by anticoagulation clinic  120 tablet  1   No current facility-administered medications on file prior to visit.    No Known Allergies  Past Medical History  Diagnosis Date  . Dilated cardiomyopathy     with ejection fraction of 30-35%  . Atrial fibrillation   . Hypertension   . Hypothyroidism   . Lightheadedness   . History of shingles   . Chronic systolic CHF (congestive heart failure) 04/05/2012    Past Surgical History  Procedure Laterality Date  . Cataract extraction    . Vein ligation    . Thyroidectomy    . Foot  surgery    . Tonsillectomy    . Appendectomy    . Cesarean section      History  Smoking status  . Never Smoker   Smokeless tobacco  . Not on file    History  Alcohol Use No    Family History  Problem Relation Age of Onset  . Hypertension Mother   . Heart attack Father     Review of Systems:  All other systems  as noted in history of present illness. All other systems were reviewed and are negative.  Physical Exam: BP 122/64  Pulse 52  Ht 5\' 6"  (1.676 m)  Wt 139 lb (63.05 kg)  BMI 22.45 kg/m2 She is a pleasant elderly white female in no acute distress. HEENT exam is unremarkable.  Neck is supple no JVD, adenopathy, thyromegaly, or bruits. Lungs are clear. Cardiac exam reveals a grade 1-2/6 systolic murmur at apex. Pulse is irregular. There is no S3. Abdomen is soft and nontender without masses. She has tr-1+ edema. She does have diffuse superficial varicosities in her lower  extremities.   Pedal pulses are palpable. Neurologic exam is nonfocal.  LABORATORY DATA: Lab Results  Component Value Date   WBC 4.7 02/16/2013   HGB 13.4 02/16/2013   HCT 39.7 02/16/2013   PLT 127.0* 02/16/2013   GLUCOSE 92 02/16/2013   NA 136  02/16/2013   K 4.7 02/16/2013   CL 101 02/16/2013   CREATININE 0.6 02/16/2013   BUN 11 02/16/2013   CO2 34* 02/16/2013   TSH 1.76 02/16/2013   INR 3.0 04/05/2013   Echo:Study Conclusions  - Left ventricle: The cavity size was normal. There was mild concentric hypertrophy. Systolic function was normal. The estimated ejection fraction was in the range of 55% to 60%. Wall motion was normal; there were no regional wall motion abnormalities. The study is not technically sufficient to allow evaluation of LV diastolic function. - Mitral valve: Moderately thickened leaflets . Moderate regurgitation. - Left atrium: The atrium was massively dilated. - Right atrium: The atrium was severely dilated. - Tricuspid valve: Mild-moderate regurgitation. - Pulmonary  arteries: PA peak pressure: 35mm Hg (S). - Pericardium, extracardiac: A small pericardial effusion was identified posterior to the heart. There was no evidence of hemodynamic compromise.    Assessment / Plan: 1. Chronic congestive heart failure -recent echocardiogram showed significant improvement in her LV function. He still has moderate mitral insufficiency. We'll continue her current therapy with carvedilol and ACE inhibitor. Continue current diuretic dose. I'll followup again in 6 months.  2. Atrial fibrillation. Rate is well controlled now. We had previously reduced her carvedilol dose because of bradycardia and this has improved. Continue anticoagulation with Coumadin.  3. Hypertension, controlled.

## 2013-04-05 NOTE — Patient Instructions (Signed)
Continue your current therapy  I will see you in 6 months.   

## 2013-05-03 ENCOUNTER — Ambulatory Visit (INDEPENDENT_AMBULATORY_CARE_PROVIDER_SITE_OTHER): Payer: Medicare Other | Admitting: *Deleted

## 2013-05-03 DIAGNOSIS — I4891 Unspecified atrial fibrillation: Secondary | ICD-10-CM

## 2013-05-03 DIAGNOSIS — Z7901 Long term (current) use of anticoagulants: Secondary | ICD-10-CM

## 2013-06-14 ENCOUNTER — Ambulatory Visit (INDEPENDENT_AMBULATORY_CARE_PROVIDER_SITE_OTHER): Payer: Medicare Other | Admitting: *Deleted

## 2013-06-14 DIAGNOSIS — Z7901 Long term (current) use of anticoagulants: Secondary | ICD-10-CM

## 2013-06-14 DIAGNOSIS — I4891 Unspecified atrial fibrillation: Secondary | ICD-10-CM

## 2013-06-14 DIAGNOSIS — Z5181 Encounter for therapeutic drug level monitoring: Secondary | ICD-10-CM

## 2013-06-14 LAB — POCT INR: INR: 2.4

## 2013-06-28 ENCOUNTER — Telehealth: Payer: Self-pay | Admitting: Cardiology

## 2013-06-28 ENCOUNTER — Other Ambulatory Visit: Payer: Self-pay

## 2013-06-28 MED ORDER — QUINAPRIL HCL 20 MG PO TABS: 10.0000 mg | ORAL_TABLET | Freq: Every day | ORAL | Status: DC

## 2013-06-28 MED ORDER — FUROSEMIDE 20 MG PO TABS: 20.0000 mg | ORAL_TABLET | Freq: Every day | ORAL | Status: DC

## 2013-06-28 NOTE — Telephone Encounter (Deleted)
Error

## 2013-07-26 ENCOUNTER — Ambulatory Visit (INDEPENDENT_AMBULATORY_CARE_PROVIDER_SITE_OTHER): Payer: Medicare Other | Admitting: *Deleted

## 2013-07-26 DIAGNOSIS — Z5181 Encounter for therapeutic drug level monitoring: Secondary | ICD-10-CM

## 2013-07-26 DIAGNOSIS — Z7901 Long term (current) use of anticoagulants: Secondary | ICD-10-CM

## 2013-07-26 DIAGNOSIS — I4891 Unspecified atrial fibrillation: Secondary | ICD-10-CM

## 2013-07-26 LAB — POCT INR: INR: 2.8

## 2013-08-28 ENCOUNTER — Telehealth: Payer: Self-pay | Admitting: *Deleted

## 2013-08-28 MED ORDER — WARFARIN SODIUM 5 MG PO TABS: ORAL_TABLET | ORAL | Status: DC

## 2013-08-28 NOTE — Telephone Encounter (Signed)
Patient requests coumadin refill to be sent to [facility] on [address]. Thanks, MI

## 2013-09-06 ENCOUNTER — Ambulatory Visit (INDEPENDENT_AMBULATORY_CARE_PROVIDER_SITE_OTHER): Payer: Medicare Other | Admitting: Pharmacist

## 2013-09-06 DIAGNOSIS — Z5181 Encounter for therapeutic drug level monitoring: Secondary | ICD-10-CM

## 2013-09-06 DIAGNOSIS — Z7901 Long term (current) use of anticoagulants: Secondary | ICD-10-CM

## 2013-09-06 DIAGNOSIS — I4891 Unspecified atrial fibrillation: Secondary | ICD-10-CM

## 2013-09-06 LAB — POCT INR: INR: 2.8

## 2013-10-17 ENCOUNTER — Ambulatory Visit (INDEPENDENT_AMBULATORY_CARE_PROVIDER_SITE_OTHER): Payer: Medicare Other | Admitting: *Deleted

## 2013-10-17 ENCOUNTER — Encounter: Payer: Self-pay | Admitting: Cardiology

## 2013-10-17 ENCOUNTER — Ambulatory Visit (INDEPENDENT_AMBULATORY_CARE_PROVIDER_SITE_OTHER): Payer: Medicare Other | Admitting: Cardiology

## 2013-10-17 VITALS — BP 142/78 | HR 56 | Ht 66.0 in | Wt 138.0 lb

## 2013-10-17 DIAGNOSIS — I428 Other cardiomyopathies: Secondary | ICD-10-CM

## 2013-10-17 DIAGNOSIS — I4891 Unspecified atrial fibrillation: Secondary | ICD-10-CM

## 2013-10-17 DIAGNOSIS — I509 Heart failure, unspecified: Secondary | ICD-10-CM

## 2013-10-17 DIAGNOSIS — I5022 Chronic systolic (congestive) heart failure: Secondary | ICD-10-CM

## 2013-10-17 DIAGNOSIS — Z7901 Long term (current) use of anticoagulants: Secondary | ICD-10-CM

## 2013-10-17 DIAGNOSIS — I420 Dilated cardiomyopathy: Secondary | ICD-10-CM

## 2013-10-17 DIAGNOSIS — Z5181 Encounter for therapeutic drug level monitoring: Secondary | ICD-10-CM

## 2013-10-17 DIAGNOSIS — I10 Essential (primary) hypertension: Secondary | ICD-10-CM

## 2013-10-17 LAB — POCT INR: INR: 2

## 2013-10-17 NOTE — Patient Instructions (Signed)
Continue your current therapy  I will see you in 6 months.   

## 2013-10-17 NOTE — Progress Notes (Signed)
Gabriela ChattersEunice E Hernandez Date of Birth: 12/21/1926   History of Present Illness: Gabriela Hernandez is seen today for followup of her CHF. She has a history of nonischemic cardiomyopathy with prior echo demonstrated an ejection fraction of 30-35%. Echocardiogram in October showed significant improvement in her ejection fraction to 55-60%. She had moderate mitral insufficiency. On followup today she states her breathing is doing okay. She does  give out easily. She will have occasional SOB with exertion. She stays active.  Current Outpatient Prescriptions on File Prior to Visit  Medication Sig Dispense Refill  . carvedilol (COREG) 25 MG tablet Take 0.5 tablets (12.5 mg total) by mouth 2 (two) times daily with a meal.  180 tablet  5  . furosemide (LASIX) 20 MG tablet Take 1 tablet (20 mg total) by mouth daily.  90 tablet  3  . levothyroxine (SYNTHROID, LEVOTHROID) 200 MCG tablet Take 200 mcg by mouth as directed. 300 MG daily fives days a week and 200 MG two days a week.      . Multiple Vitamin (MULTI-VITAMIN PO) Take by mouth daily.        . quinapril (ACCUPRIL) 20 MG tablet Take 0.5 tablets (10 mg total) by mouth daily. 1/2 daily  90 tablet  3  . warfarin (COUMADIN) 5 MG tablet Take as directed by anticoagulation clinic  120 tablet  1   No current facility-administered medications on file prior to visit.    No Known Allergies  Past Medical History  Diagnosis Date  . Dilated cardiomyopathy     with ejection fraction of 30-35%  . Atrial fibrillation   . Hypertension   . Hypothyroidism   . Lightheadedness   . History of shingles   . Chronic systolic CHF (congestive heart failure) 04/05/2012    Past Surgical History  Procedure Laterality Date  . Cataract extraction    . Vein ligation    . Thyroidectomy    . Foot surgery    . Tonsillectomy    . Appendectomy    . Cesarean section      History  Smoking status  . Never Smoker   Smokeless tobacco  . Not on file    History  Alcohol  Use No    Family History  Problem Relation Age of Onset  . Hypertension Mother   . Heart attack Father     Review of Systems:  All other systems  as noted in history of present illness. All other systems were reviewed and are negative.  Physical Exam: BP 142/78  Pulse 56  Ht 5\' 6"  (1.676 m)  Wt 138 lb (62.596 kg)  BMI 22.28 kg/m2 She is a pleasant elderly white female in no acute distress. HEENT exam is unremarkable.  Neck is supple no JVD, adenopathy, thyromegaly, or bruits. Lungs are clear. Cardiac exam reveals a grade 1-2/6 systolic murmur at apex. Pulse is irregular. There is no S3. Abdomen is soft and nontender without masses. She has tr-1+ edema. She does have diffuse superficial varicosities in her lower  extremities.   Pedal pulses are palpable. Neurologic exam is nonfocal.  LABORATORY DATA: Lab Results  Component Value Date   WBC 4.7 02/16/2013   HGB 13.4 02/16/2013   HCT 39.7 02/16/2013   PLT 127.0* 02/16/2013   GLUCOSE 92 02/16/2013   NA 136 02/16/2013   K 4.7 02/16/2013   CL 101 02/16/2013   CREATININE 0.6 02/16/2013   BUN 11 02/16/2013   CO2 34* 02/16/2013   TSH 1.76 02/16/2013  INR 2.0 10/17/2013   Echo:Study Conclusions  - Left ventricle: The cavity size was normal. There was mild concentric hypertrophy. Systolic function was normal. The estimated ejection fraction was in the range of 55% to 60%. Wall motion was normal; there were no regional wall motion abnormalities. The study is not technically sufficient to allow evaluation of LV diastolic function. - Mitral valve: Moderately thickened leaflets . Moderate regurgitation. - Left atrium: The atrium was massively dilated. - Right atrium: The atrium was severely dilated. - Tricuspid valve: Mild-moderate regurgitation. - Pulmonary arteries: PA peak pressure: 44mm Hg (S). - Pericardium, extracardiac: A small pericardial effusion was identified posterior to the heart. There was no evidence of hemodynamic  compromise.    Assessment / Plan: 1. Chronic congestive heart failure -last echocardiogram showed significant improvement in her LV function. She has moderate mitral insufficiency. We'll continue her current therapy with carvedilol and ACE inhibitor. Continue current diuretic dose. I'll followup again in 6 months.  2. Atrial fibrillation. Rate is well controlled now. We had previously reduced her carvedilol dose because of bradycardia and this has improved. Continue anticoagulation with Coumadin.  3. Hypertension, controlled.

## 2013-10-18 ENCOUNTER — Other Ambulatory Visit: Payer: Self-pay | Admitting: Cardiology

## 2013-11-20 ENCOUNTER — Telehealth: Payer: Self-pay | Admitting: *Deleted

## 2013-11-20 NOTE — Telephone Encounter (Signed)
Pt called stating had started taking Alendronate 70 mg weekly and instructed has no interaction with coumadin

## 2013-11-29 ENCOUNTER — Ambulatory Visit (INDEPENDENT_AMBULATORY_CARE_PROVIDER_SITE_OTHER): Payer: Medicare Other | Admitting: *Deleted

## 2013-11-29 DIAGNOSIS — Z7901 Long term (current) use of anticoagulants: Secondary | ICD-10-CM

## 2013-11-29 DIAGNOSIS — Z5181 Encounter for therapeutic drug level monitoring: Secondary | ICD-10-CM

## 2013-11-29 DIAGNOSIS — I4891 Unspecified atrial fibrillation: Secondary | ICD-10-CM

## 2013-11-29 LAB — POCT INR: INR: 3

## 2014-01-10 ENCOUNTER — Ambulatory Visit (INDEPENDENT_AMBULATORY_CARE_PROVIDER_SITE_OTHER): Payer: Medicare Other | Admitting: Pharmacist

## 2014-01-10 DIAGNOSIS — I4891 Unspecified atrial fibrillation: Secondary | ICD-10-CM

## 2014-01-10 DIAGNOSIS — Z7901 Long term (current) use of anticoagulants: Secondary | ICD-10-CM

## 2014-01-10 DIAGNOSIS — Z5181 Encounter for therapeutic drug level monitoring: Secondary | ICD-10-CM

## 2014-01-10 LAB — POCT INR: INR: 3.4

## 2014-01-30 ENCOUNTER — Encounter: Payer: Self-pay | Admitting: Cardiology

## 2014-01-31 ENCOUNTER — Ambulatory Visit (INDEPENDENT_AMBULATORY_CARE_PROVIDER_SITE_OTHER): Payer: Medicare Other | Admitting: *Deleted

## 2014-01-31 DIAGNOSIS — Z7901 Long term (current) use of anticoagulants: Secondary | ICD-10-CM

## 2014-01-31 DIAGNOSIS — I4891 Unspecified atrial fibrillation: Secondary | ICD-10-CM

## 2014-01-31 DIAGNOSIS — Z5181 Encounter for therapeutic drug level monitoring: Secondary | ICD-10-CM

## 2014-01-31 LAB — POCT INR: INR: 3.1

## 2014-02-05 ENCOUNTER — Telehealth: Payer: Self-pay | Admitting: Cardiology

## 2014-02-05 NOTE — Telephone Encounter (Signed)
Returned call to patient she stated she would like to be seen.Stated since June she has lost 11 lbs.Stated use to weigh 140 lbs and today she weighs 129 lbs.Stated she is sob and having swelling in both feet.Appointment scheduled with Norma Fredrickson NP tomorrow 02/06/14 at 2:00 pm at our [facility].

## 2014-02-05 NOTE — Telephone Encounter (Signed)
Please call, SOB,swelling and losing weight.

## 2014-02-06 ENCOUNTER — Encounter: Payer: Self-pay | Admitting: Nurse Practitioner

## 2014-02-06 ENCOUNTER — Ambulatory Visit (INDEPENDENT_AMBULATORY_CARE_PROVIDER_SITE_OTHER): Payer: Medicare Other | Admitting: Nurse Practitioner

## 2014-02-06 ENCOUNTER — Ambulatory Visit
Admission: RE | Admit: 2014-02-06 | Discharge: 2014-02-06 | Disposition: A | Discharge status: Home / Self Care | Payer: Medicare Other | Source: Ambulatory Visit | Attending: Nurse Practitioner | Admitting: Nurse Practitioner

## 2014-02-06 VITALS — BP 130/60 | HR 78 | Ht 66.0 in | Wt 130.1 lb

## 2014-02-06 DIAGNOSIS — I428 Other cardiomyopathies: Secondary | ICD-10-CM

## 2014-02-06 DIAGNOSIS — I482 Chronic atrial fibrillation, unspecified: Secondary | ICD-10-CM

## 2014-02-06 DIAGNOSIS — I4891 Unspecified atrial fibrillation: Secondary | ICD-10-CM

## 2014-02-06 DIAGNOSIS — R0609 Other forms of dyspnea: Secondary | ICD-10-CM

## 2014-02-06 DIAGNOSIS — I509 Heart failure, unspecified: Secondary | ICD-10-CM

## 2014-02-06 DIAGNOSIS — Z7901 Long term (current) use of anticoagulants: Secondary | ICD-10-CM

## 2014-02-06 DIAGNOSIS — R0600 Dyspnea, unspecified: Secondary | ICD-10-CM

## 2014-02-06 DIAGNOSIS — R0989 Other specified symptoms and signs involving the circulatory and respiratory systems: Secondary | ICD-10-CM

## 2014-02-06 DIAGNOSIS — I5022 Chronic systolic (congestive) heart failure: Secondary | ICD-10-CM

## 2014-02-06 LAB — CBC
HCT: 38.7 % (ref 36.0–46.0)
Hemoglobin: 12.9 g/dL (ref 12.0–15.0)
MCHC: 33.3 g/dL (ref 30.0–36.0)
MCV: 87.5 fl (ref 78.0–100.0)
Platelets: 125 10*3/uL — ABNORMAL LOW (ref 150.0–400.0)
RBC: 4.43 Mil/uL (ref 3.87–5.11)
RDW: 16.5 % — ABNORMAL HIGH (ref 11.5–15.5)
WBC: 4.4 10*3/uL (ref 4.0–10.5)

## 2014-02-06 LAB — BASIC METABOLIC PANEL
BUN: 7 mg/dL (ref 6–23)
CO2: 32 mEq/L (ref 19–32)
Calcium: 9.6 mg/dL (ref 8.4–10.5)
Chloride: 96 mEq/L (ref 96–112)
Creatinine, Ser: 0.5 mg/dL (ref 0.4–1.2)
GFR: 130.69 mL/min (ref >60.00)
Glucose, Bld: 112 mg/dL — ABNORMAL HIGH (ref 70–99)
Potassium: 3.6 mEq/L (ref 3.5–5.1)
Sodium: 132 mEq/L — ABNORMAL LOW (ref 135–145)

## 2014-02-06 LAB — TSH: TSH: 0 u[IU]/mL — ABNORMAL LOW (ref 0.35–4.50)

## 2014-02-06 LAB — BRAIN NATRIURETIC PEPTIDE: Pro B Natriuretic peptide (BNP): 131 pg/mL — ABNORMAL HIGH (ref 0.0–100.0)

## 2014-02-06 MED ORDER — FUROSEMIDE 20 MG PO TABS: 20.0000 mg | ORAL_TABLET | Freq: Two times a day (BID) | ORAL | Status: DC

## 2014-02-06 NOTE — Patient Instructions (Addendum)
I am going to send you for a CXR  Please go to [facility] to [facility] on the first floor for a chest Xray - you may walk in.   We are checking labs today  - this will tell me about your thyroid level and if your shortness of breath is coming from your heart or your lungs  For now, stay on your current medicines  I refilled the Lasix today  Call the [facility] office at [phone] if you have any questions, problems or concerns.

## 2014-02-06 NOTE — Progress Notes (Signed)
Gabriela Hernandez Hay Date of Birth: 05-13-1927 Medical Record #[id number]  History of Present Illness: Gabriela Hernandez is seen back today for a work in visit. Seen for Dr. Swaziland. She is an 80 year old female with a NICM - EF was 30 to 35% but echo from October of 2014 showed EF of 55 to 60%. Her other issues include chronic coumadin therapy, HTN, hypothyroidism, atrial fib, and chronic systolic HF.   Last seen in June by Dr. Swaziland - was felt to be doing ok.  Called yesterday with weight loss and dyspnea. Thus added to my schedule.   Comes back today. Here alone. Says she is more short of breath. Says she was short of breath when she was last seen. Now having to stop her activities because she is short of breath. Had it with coming in from the parking lot. Some palpitations as well. No passing out. Her lasix was increased to BID. Has been doing this for maybe a month or so. Has swelling. Says she can't wear support stockings. Probably getting too much salt. Only short of breath with exertion - she is fine at rest. Her thyroid medicine dose is very confusing - she seems confused on it as well. No fever or chills. Rare hot flash. No cough.   Current Outpatient Prescriptions  Medication Sig Dispense Refill  . Calcium Carbonate-Vitamin D (CALCIUM + D PO) Take by mouth 2 (two) times daily.       . carvedilol (COREG) 25 MG tablet TAKE 1/2 TABLET BY MOUTH TWICE DAILY WITH A MEAL  90 tablet  1  . furosemide (LASIX) 20 MG tablet Take 20 mg by mouth 2 (two) times daily.      Marland Kitchen levothyroxine (SYNTHROID, LEVOTHROID) 200 MCG tablet Take 300 mcg by mouth as directed. 500 mcg 5 days a week and 200 mcg two days a week      . quinapril (ACCUPRIL) 20 MG tablet Take 10 mg by mouth daily.      Marland Kitchen warfarin (COUMADIN) 5 MG tablet Take as directed by anticoagulation clinic  120 tablet  1   No current facility-administered medications for this visit.    No Known Allergies  Past Medical History  Diagnosis Date  .  Dilated cardiomyopathy     with ejection fraction of 30-35%  . Atrial fibrillation   . Hypertension   . Hypothyroidism   . Lightheadedness   . History of shingles   . Chronic systolic CHF (congestive heart failure) 04/05/2012    Past Surgical History  Procedure Laterality Date  . Cataract extraction    . Vein ligation    . Thyroidectomy    . Foot surgery    . Tonsillectomy    . Appendectomy    . Cesarean section      History  Smoking status  . Never Smoker   Smokeless tobacco  . Not on file    History  Alcohol Use No    Family History  Problem Relation Age of Onset  . Hypertension Mother   . Heart attack Father     Review of Systems: The review of systems is per the HPI.  All other systems were reviewed and are negative.  Physical Exam: BP 130/60  Pulse 78  Ht  (1.676 m)  Wt 130 lb 1.9 oz (59.022 kg)  BMI 21.01 kg/m2  SpO2 96% Patient is very pleasant and in no acute distress. Skin is warm and dry. Color is normal.  HEENT is  unremarkable. Normocephalic/atraumatic. PERRL. Sclera are nonicteric. Neck is supple. No masses. No JVD. Lungs are clear. Cardiac exam shows an irregular rhythm. Rate is ok. Abdomen is soft. Extremities are with 1+ edema. Multiple varicosities. Gait and ROM are intact. No gross neurologic deficits noted.  Oxygen sat is 97% at rest and with walking in the office she was short of breath but her oxygen level stayed at 97%.   Wt Readings from Last 3 Encounters:  02/06/14 130 lb 1.9 oz (59.022 kg)  10/17/13 138 lb (62.596 kg)  04/05/13 139 lb (63.05 kg)    LABORATORY DATA/PROCEDURES:  Lab Results  Component Value Date   WBC 4.7 02/16/2013   HGB 13.4 02/16/2013   HCT 39.7 02/16/2013   PLT 127.0* 02/16/2013   GLUCOSE 92 02/16/2013   NA 136 02/16/2013   K 4.7 02/16/2013   CL 101 02/16/2013   CREATININE 0.6 02/16/2013   BUN 11 02/16/2013   CO2 34* 02/16/2013   TSH 1.76 02/16/2013   INR 3.1 01/31/2014    BNP (last 3 results)  Recent  Labs  02/16/13 1624  PROBNP 96.0   Echo Study Conclusions from October 2014  - Left ventricle: The cavity size was normal. There was mild concentric hypertrophy. Systolic function was normal. The estimated ejection fraction was in the range of 55% to 60%. Wall motion was normal; there were no regional wall motion abnormalities. The study is not technically sufficient to allow evaluation of LV diastolic function. - Mitral valve: Moderately thickened leaflets . Moderate regurgitation. - Left atrium: The atrium was massively dilated. - Right atrium: The atrium was severely dilated. - Tricuspid valve: Mild-moderate regurgitation. - Pulmonary arteries: PA peak pressure: 48mm Hg (S). - Pericardium, extracardiac: A small pericardial effusion was identified posterior to the heart. There was no evidence of hemodynamic compromise.    Assessment / Plan: 1. NICM with past EF of 30 to 35% but with improvement to 55 to 60% per most recent study from October - now with progressive DOE - not sure if this is cardiac related. Her weight is down. Will check BNP. Check baseline labs today and send for CXR. Further disposition to follow.   2. Chronic atrial fib - rate ok. Check follow up CBC with her coumadin.  3. Chronic anticoagulation - no obvious problems noted.   4. Weight loss - her thyroid dosing is confusing - she is confused - I am not certain that she is taking as prescribed. - recheck TSH and send to Dr. Clovis Riley.   Further disposition to follow.   Patient is agreeable to this plan and will call if any problems develop in the interim.   Rosalio Macadamia, RN, ANP-C [facility] [address] [place], [place]  [address] [phone]

## 2014-02-16 ENCOUNTER — Ambulatory Visit (INDEPENDENT_AMBULATORY_CARE_PROVIDER_SITE_OTHER): Payer: Medicare Other | Admitting: *Deleted

## 2014-02-16 DIAGNOSIS — Z7901 Long term (current) use of anticoagulants: Secondary | ICD-10-CM

## 2014-02-16 DIAGNOSIS — I4891 Unspecified atrial fibrillation: Secondary | ICD-10-CM

## 2014-02-16 DIAGNOSIS — Z5181 Encounter for therapeutic drug level monitoring: Secondary | ICD-10-CM

## 2014-02-16 LAB — POCT INR: INR: 3.3

## 2014-02-20 ENCOUNTER — Other Ambulatory Visit: Payer: Self-pay | Admitting: Family Medicine

## 2014-02-20 DIAGNOSIS — R221 Localized swelling, mass and lump, neck: Secondary | ICD-10-CM

## 2014-02-20 DIAGNOSIS — R1310 Dysphagia, unspecified: Secondary | ICD-10-CM

## 2014-02-22 ENCOUNTER — Ambulatory Visit
Admission: RE | Admit: 2014-02-22 | Discharge: 2014-02-22 | Disposition: A | Discharge status: Home / Self Care | Payer: Medicare Other | Source: Ambulatory Visit | Attending: Family Medicine | Admitting: Family Medicine

## 2014-02-22 ENCOUNTER — Other Ambulatory Visit: Payer: Medicare Other

## 2014-02-22 DIAGNOSIS — R221 Localized swelling, mass and lump, neck: Secondary | ICD-10-CM

## 2014-02-22 DIAGNOSIS — R1310 Dysphagia, unspecified: Secondary | ICD-10-CM

## 2014-02-27 NOTE — Telephone Encounter (Signed)
error 

## 2014-03-02 ENCOUNTER — Ambulatory Visit (INDEPENDENT_AMBULATORY_CARE_PROVIDER_SITE_OTHER): Payer: Medicare Other

## 2014-03-02 DIAGNOSIS — I4891 Unspecified atrial fibrillation: Secondary | ICD-10-CM

## 2014-03-02 DIAGNOSIS — Z7901 Long term (current) use of anticoagulants: Secondary | ICD-10-CM

## 2014-03-02 DIAGNOSIS — Z5181 Encounter for therapeutic drug level monitoring: Secondary | ICD-10-CM

## 2014-03-02 LAB — POCT INR: INR: 3.3

## 2014-03-07 ENCOUNTER — Telehealth: Payer: Self-pay | Admitting: *Deleted

## 2014-03-07 NOTE — Telephone Encounter (Signed)
Pt called stating has medication changes now on Pantoprazole 40 mg daily and has had reduction in  Levothyroxine to 175 MCG  Daily. Pt instructed that Pantoprazole does not affect her INR but may have some change in INR with reduction in Levothyroxine but she has appt to see [place] in 1 week so instructed will be fine to keep that appt and she states understanding

## 2014-03-16 ENCOUNTER — Ambulatory Visit (INDEPENDENT_AMBULATORY_CARE_PROVIDER_SITE_OTHER): Payer: Medicare Other | Admitting: *Deleted

## 2014-03-16 DIAGNOSIS — I4891 Unspecified atrial fibrillation: Secondary | ICD-10-CM

## 2014-03-16 DIAGNOSIS — Z7901 Long term (current) use of anticoagulants: Secondary | ICD-10-CM

## 2014-03-16 DIAGNOSIS — Z5181 Encounter for therapeutic drug level monitoring: Secondary | ICD-10-CM

## 2014-03-16 LAB — POCT INR: INR: 3.5

## 2014-03-22 ENCOUNTER — Other Ambulatory Visit: Payer: Self-pay | Admitting: Dermatology

## 2014-03-29 ENCOUNTER — Ambulatory Visit (INDEPENDENT_AMBULATORY_CARE_PROVIDER_SITE_OTHER): Payer: Medicare Other | Admitting: *Deleted

## 2014-03-29 DIAGNOSIS — I4891 Unspecified atrial fibrillation: Secondary | ICD-10-CM

## 2014-03-29 DIAGNOSIS — Z5181 Encounter for therapeutic drug level monitoring: Secondary | ICD-10-CM

## 2014-03-29 DIAGNOSIS — Z7901 Long term (current) use of anticoagulants: Secondary | ICD-10-CM

## 2014-03-29 LAB — POCT INR: INR: 2.1

## 2014-04-06 ENCOUNTER — Other Ambulatory Visit: Payer: Self-pay

## 2014-04-06 MED ORDER — WARFARIN SODIUM 5 MG PO TABS: ORAL_TABLET | ORAL | Status: DC

## 2014-04-10 ENCOUNTER — Telehealth: Payer: Self-pay | Admitting: Cardiology

## 2014-04-10 NOTE — Telephone Encounter (Signed)
Received records from [facility] (Dr Lupe Carney) for appointment with Dr Swaziland on 04/16/14.  Records given to [facility] (medical records) for Dr Elvis Coil schedule on 04/16/14.  lp

## 2014-04-16 ENCOUNTER — Ambulatory Visit (INDEPENDENT_AMBULATORY_CARE_PROVIDER_SITE_OTHER): Payer: Medicare Other | Admitting: Cardiology

## 2014-04-16 ENCOUNTER — Ambulatory Visit (INDEPENDENT_AMBULATORY_CARE_PROVIDER_SITE_OTHER): Payer: Medicare Other | Admitting: Pharmacist Clinician (PhC)/ Clinical Pharmacy Specialist

## 2014-04-16 ENCOUNTER — Encounter: Payer: Self-pay | Admitting: Cardiology

## 2014-04-16 VITALS — BP 122/66 | HR 64 | Ht 66.0 in | Wt 130.0 lb

## 2014-04-16 DIAGNOSIS — Z5181 Encounter for therapeutic drug level monitoring: Secondary | ICD-10-CM

## 2014-04-16 DIAGNOSIS — Z7901 Long term (current) use of anticoagulants: Secondary | ICD-10-CM

## 2014-04-16 DIAGNOSIS — I10 Essential (primary) hypertension: Secondary | ICD-10-CM

## 2014-04-16 DIAGNOSIS — I482 Chronic atrial fibrillation, unspecified: Secondary | ICD-10-CM

## 2014-04-16 DIAGNOSIS — I420 Dilated cardiomyopathy: Secondary | ICD-10-CM

## 2014-04-16 DIAGNOSIS — I5022 Chronic systolic (congestive) heart failure: Secondary | ICD-10-CM

## 2014-04-16 DIAGNOSIS — I4891 Unspecified atrial fibrillation: Secondary | ICD-10-CM

## 2014-04-16 LAB — POCT INR: INR: 2.7

## 2014-04-16 MED ORDER — CARVEDILOL 25 MG PO TABS: 12.5000 mg | ORAL_TABLET | Freq: Two times a day (BID) | ORAL | Status: DC

## 2014-04-16 MED ORDER — FUROSEMIDE 20 MG PO TABS
20.0000 mg | ORAL_TABLET | Freq: Two times a day (BID) | ORAL | Status: DC
Start: 2014-04-16 — End: 2015-07-03

## 2014-04-16 NOTE — Progress Notes (Signed)
Gabriela ChattersEunice E Hernandez Date of Birth: 12/20/1926   History of Present Illness: [name] is seen today for followup of her CHF. She has a history of nonischemic cardiomyopathy with prior echo demonstrated an ejection fraction of 30-35%. Echocardiogram in October 2014 showed significant improvement in her ejection fraction to 55-60%. She has moderate mitral insufficiency. She also has a history of chronic atrial fibrillation and is on coumadin. On followup today she states she is doing well. She denies any chest pain or SOB.  She stays active. She has been evaluated for dysphagia by Dr. Randa EvensEdwards and found to have a stricture and hiatal hernia. She has been treated with a dysphagia diet and Protonix with improvement.  Current Outpatient Prescriptions on File Prior to Visit  Medication Sig Dispense Refill  . Calcium Carbonate-Vitamin D (CALCIUM + D PO) Take by mouth 2 (two) times daily.     Marland Kitchen. levothyroxine (SYNTHROID, LEVOTHROID) 175 MCG tablet Take 175 mcg by mouth daily before breakfast.    . pantoprazole (PROTONIX) 40 MG tablet Take 40 mg by mouth daily.    . quinapril (ACCUPRIL) 20 MG tablet Take 10 mg by mouth daily.    Marland Kitchen. warfarin (COUMADIN) 5 MG tablet Take as directed by anticoagulation clinic 120 tablet 1   No current facility-administered medications on file prior to visit.    No Known Allergies  Past Medical History  Diagnosis Date  . Dilated cardiomyopathy     with ejection fraction of 30-35%  . Atrial fibrillation   . Hypertension   . Hypothyroidism   . Lightheadedness   . History of shingles   . Chronic systolic CHF (congestive heart failure) 04/05/2012  . Dysphagia   . Esophageal stricture     Past Surgical History  Procedure Laterality Date  . Cataract extraction    . Vein ligation    . Thyroidectomy    . Foot surgery    . Tonsillectomy    . Appendectomy    . Cesarean section      History  Smoking status  . Never Smoker   Smokeless tobacco  . Not on file     History  Alcohol Use No    Family History  Problem Relation Age of Onset  . Hypertension Mother   . Heart attack Father     Review of Systems:  All other systems  as noted in history of present illness. All other systems were reviewed and are negative.  Physical Exam: BP 122/66 mmHg  Pulse 64  Ht 5\' 6"  (1.676 m)  Wt 130 lb (58.968 kg)  BMI 20.99 kg/m2 She is a pleasant elderly white female in no acute distress. HEENT exam is unremarkable.  Neck is supple no JVD, adenopathy, thyromegaly, or bruits. Lungs are clear. Cardiac exam reveals a grade 1-2/6 systolic murmur at apex. Pulse is irregular. There is no S3. Abdomen is soft and nontender without masses. She has tr-1+ edema. She does have diffuse superficial varicosities in her lower  extremities.   Pedal pulses are palpable. Neurologic exam is nonfocal.  LABORATORY DATA: Lab Results  Component Value Date   WBC 4.4 02/06/2014   HGB 12.9 02/06/2014   HCT 38.7 02/06/2014   PLT 125.0* 02/06/2014   GLUCOSE 112* 02/06/2014   NA 132* 02/06/2014   K 3.6 02/06/2014   CL 96 02/06/2014   CREATININE 0.5 02/06/2014   BUN 7 02/06/2014   CO2 32 02/06/2014   TSH 0.00 Repeated and verified X2.* 02/06/2014   INR  2.7 04/16/2014   Ecg: today. Atrial fibrillation with rate 64 bpm. Old anteroseptal infarct.    Assessment / Plan: 1. Chronic congestive heart failure -last echocardiogram showed significant improvement in her LV function. She has moderate mitral insufficiency. We'll continue her current therapy with carvedilol and ACE inhibitor. Continue current diuretic dose. I'll followup again in 6 months.  2. Atrial fibrillation. Rate is well controlled now. We had previously reduced her carvedilol dose because of bradycardia and this has improved. Continue anticoagulation with Coumadin. INR today 2.7.  3. Hypertension, controlled.

## 2014-04-16 NOTE — Patient Instructions (Signed)
Continue your current therapy  I will see you in 6 months.   

## 2014-05-07 ENCOUNTER — Other Ambulatory Visit: Payer: Self-pay | Admitting: Dermatology

## 2014-05-15 ENCOUNTER — Ambulatory Visit (INDEPENDENT_AMBULATORY_CARE_PROVIDER_SITE_OTHER): Payer: Medicare Other | Admitting: *Deleted

## 2014-05-15 DIAGNOSIS — I4891 Unspecified atrial fibrillation: Secondary | ICD-10-CM

## 2014-05-15 DIAGNOSIS — Z5181 Encounter for therapeutic drug level monitoring: Secondary | ICD-10-CM

## 2014-05-15 DIAGNOSIS — I482 Chronic atrial fibrillation, unspecified: Secondary | ICD-10-CM

## 2014-05-15 DIAGNOSIS — Z7901 Long term (current) use of anticoagulants: Secondary | ICD-10-CM

## 2014-05-15 LAB — POCT INR: INR: 2.6

## 2014-06-12 ENCOUNTER — Ambulatory Visit (INDEPENDENT_AMBULATORY_CARE_PROVIDER_SITE_OTHER): Payer: Medicare Other | Admitting: *Deleted

## 2014-06-12 DIAGNOSIS — I482 Chronic atrial fibrillation, unspecified: Secondary | ICD-10-CM

## 2014-06-12 DIAGNOSIS — I4891 Unspecified atrial fibrillation: Secondary | ICD-10-CM

## 2014-06-12 DIAGNOSIS — Z5181 Encounter for therapeutic drug level monitoring: Secondary | ICD-10-CM

## 2014-06-12 DIAGNOSIS — Z7901 Long term (current) use of anticoagulants: Secondary | ICD-10-CM

## 2014-06-12 LAB — POCT INR: INR: 2

## 2014-07-24 ENCOUNTER — Ambulatory Visit (INDEPENDENT_AMBULATORY_CARE_PROVIDER_SITE_OTHER): Payer: Medicare Other | Admitting: *Deleted

## 2014-07-24 DIAGNOSIS — I482 Chronic atrial fibrillation, unspecified: Secondary | ICD-10-CM

## 2014-07-24 DIAGNOSIS — Z5181 Encounter for therapeutic drug level monitoring: Secondary | ICD-10-CM

## 2014-07-24 DIAGNOSIS — Z7901 Long term (current) use of anticoagulants: Secondary | ICD-10-CM

## 2014-07-24 DIAGNOSIS — I4891 Unspecified atrial fibrillation: Secondary | ICD-10-CM

## 2014-07-24 LAB — POCT INR: INR: 1.4

## 2014-08-01 ENCOUNTER — Ambulatory Visit (INDEPENDENT_AMBULATORY_CARE_PROVIDER_SITE_OTHER): Payer: Medicare Other | Admitting: *Deleted

## 2014-08-01 DIAGNOSIS — Z7901 Long term (current) use of anticoagulants: Secondary | ICD-10-CM

## 2014-08-01 DIAGNOSIS — Z5181 Encounter for therapeutic drug level monitoring: Secondary | ICD-10-CM

## 2014-08-01 DIAGNOSIS — I4891 Unspecified atrial fibrillation: Secondary | ICD-10-CM

## 2014-08-01 DIAGNOSIS — I482 Chronic atrial fibrillation, unspecified: Secondary | ICD-10-CM

## 2014-08-01 LAB — POCT INR: INR: 1.8

## 2014-08-15 ENCOUNTER — Ambulatory Visit (INDEPENDENT_AMBULATORY_CARE_PROVIDER_SITE_OTHER): Payer: Medicare Other | Admitting: *Deleted

## 2014-08-15 DIAGNOSIS — Z5181 Encounter for therapeutic drug level monitoring: Secondary | ICD-10-CM | POA: Diagnosis not present

## 2014-08-15 DIAGNOSIS — Z7901 Long term (current) use of anticoagulants: Secondary | ICD-10-CM

## 2014-08-15 DIAGNOSIS — I4891 Unspecified atrial fibrillation: Secondary | ICD-10-CM

## 2014-08-15 DIAGNOSIS — I482 Chronic atrial fibrillation, unspecified: Secondary | ICD-10-CM

## 2014-08-15 LAB — POCT INR: INR: 2.1

## 2014-09-05 ENCOUNTER — Ambulatory Visit (INDEPENDENT_AMBULATORY_CARE_PROVIDER_SITE_OTHER): Payer: Medicare Other | Admitting: Surgery

## 2014-09-05 DIAGNOSIS — I482 Chronic atrial fibrillation, unspecified: Secondary | ICD-10-CM

## 2014-09-05 DIAGNOSIS — Z5181 Encounter for therapeutic drug level monitoring: Secondary | ICD-10-CM | POA: Diagnosis not present

## 2014-09-05 DIAGNOSIS — Z7901 Long term (current) use of anticoagulants: Secondary | ICD-10-CM | POA: Diagnosis not present

## 2014-09-05 DIAGNOSIS — I4891 Unspecified atrial fibrillation: Secondary | ICD-10-CM | POA: Diagnosis not present

## 2014-09-05 LAB — POCT INR: INR: 1.8

## 2014-09-10 ENCOUNTER — Other Ambulatory Visit: Payer: Self-pay | Admitting: Pharmacist

## 2014-09-10 MED ORDER — QUINAPRIL HCL 20 MG PO TABS: 10.0000 mg | ORAL_TABLET | Freq: Every day | ORAL | Status: DC

## 2014-09-19 ENCOUNTER — Ambulatory Visit (INDEPENDENT_AMBULATORY_CARE_PROVIDER_SITE_OTHER): Payer: Medicare Other | Admitting: *Deleted

## 2014-09-19 DIAGNOSIS — Z5181 Encounter for therapeutic drug level monitoring: Secondary | ICD-10-CM | POA: Diagnosis not present

## 2014-09-19 DIAGNOSIS — I4891 Unspecified atrial fibrillation: Secondary | ICD-10-CM

## 2014-09-19 DIAGNOSIS — I482 Chronic atrial fibrillation, unspecified: Secondary | ICD-10-CM

## 2014-09-19 DIAGNOSIS — Z7901 Long term (current) use of anticoagulants: Secondary | ICD-10-CM

## 2014-09-19 LAB — POCT INR: INR: 2.3

## 2014-10-16 ENCOUNTER — Ambulatory Visit (INDEPENDENT_AMBULATORY_CARE_PROVIDER_SITE_OTHER): Payer: Medicare Other | Admitting: Cardiology

## 2014-10-16 ENCOUNTER — Ambulatory Visit (INDEPENDENT_AMBULATORY_CARE_PROVIDER_SITE_OTHER): Payer: Medicare Other | Admitting: *Deleted

## 2014-10-16 ENCOUNTER — Encounter: Payer: Self-pay | Admitting: Cardiology

## 2014-10-16 VITALS — BP 104/74 | HR 58 | Ht 66.0 in | Wt 125.5 lb

## 2014-10-16 DIAGNOSIS — Z5181 Encounter for therapeutic drug level monitoring: Secondary | ICD-10-CM

## 2014-10-16 DIAGNOSIS — Z7901 Long term (current) use of anticoagulants: Secondary | ICD-10-CM | POA: Diagnosis not present

## 2014-10-16 DIAGNOSIS — I5022 Chronic systolic (congestive) heart failure: Secondary | ICD-10-CM | POA: Diagnosis not present

## 2014-10-16 DIAGNOSIS — I482 Chronic atrial fibrillation, unspecified: Secondary | ICD-10-CM

## 2014-10-16 DIAGNOSIS — I420 Dilated cardiomyopathy: Secondary | ICD-10-CM | POA: Diagnosis not present

## 2014-10-16 DIAGNOSIS — I4891 Unspecified atrial fibrillation: Secondary | ICD-10-CM

## 2014-10-16 LAB — POCT INR: INR: 2.1

## 2014-10-16 NOTE — Patient Instructions (Signed)
Continue your current therapy  I will see you in 6 months.   

## 2014-10-16 NOTE — Progress Notes (Signed)
Gabriela Hernandez Date of Birth: June 20, 1926   History of Present Illness: Gabriela Hernandez is seen today for followup of her CHF. She has a history of nonischemic cardiomyopathy with prior echo demonstrated an ejection fraction of 30-35%. Echocardiogram in October 2014 showed significant improvement in her ejection fraction to 55-60%. She has moderate mitral insufficiency. She also has a history of chronic atrial fibrillation and is on coumadin. On followup today she states she is doing well. She denies any chest pain or SOB.  She stays active. She has had a difficult time regulating her thyroid dose. At one time she was on 200 micrograms daily and is now down to 100 micrograms. She has lost 5 lbs since last visit.  Current Outpatient Prescriptions on File Prior to Visit  Medication Sig Dispense Refill  . carvedilol (COREG) 25 MG tablet Take 0.5 tablets (12.5 mg total) by mouth 2 (two) times daily with a meal. 90 tablet 6  . furosemide (LASIX) 20 MG tablet Take 1 tablet (20 mg total) by mouth 2 (two) times daily. 180 tablet 6  . levothyroxine (SYNTHROID, LEVOTHROID) 125 MCG tablet Take 100 mcg by mouth daily before breakfast.     . quinapril (ACCUPRIL) 20 MG tablet Take 0.5 tablets (10 mg total) by mouth daily. 45 tablet 1  . warfarin (COUMADIN) 5 MG tablet Take as directed by anticoagulation clinic 120 tablet 1   No current facility-administered medications on file prior to visit.    No Known Allergies  Past Medical History  Diagnosis Date  . Dilated cardiomyopathy     with ejection fraction of 30-35%  . Atrial fibrillation   . Hypertension   . Hypothyroidism   . Lightheadedness   . History of shingles   . Chronic systolic CHF (congestive heart failure) 04/05/2012  . Dysphagia   . Esophageal stricture     Past Surgical History  Procedure Laterality Date  . Cataract extraction    . Vein ligation    . Thyroidectomy    . Foot surgery    . Tonsillectomy    . Appendectomy    .  Cesarean section      History  Smoking status  . Never Smoker   Smokeless tobacco  . Not on file    History  Alcohol Use No    Family History  Problem Relation Age of Onset  . Hypertension Mother   . Heart attack Father     Review of Systems:  All other systems  as noted in history of present illness. All other systems were reviewed and are negative.  Physical Exam: BP 104/74 mmHg  Pulse 58  Ht  (1.676 m)  Wt 56.926 kg (125 lb 8 oz)  BMI 20.27 kg/m2 She is a pleasant elderly white female in no acute distress. HEENT exam is unremarkable.  Neck is supple no JVD, adenopathy, thyromegaly, or bruits. Lungs are clear. Cardiac exam reveals a grade 1-2/6 systolic murmur at apex. Pulse is irregular. There is no S3. Abdomen is soft and nontender without masses. She has tr  edema. She does have diffuse superficial varicosities in her lower  extremities.   Pedal pulses are palpable. Neurologic exam is nonfocal.  LABORATORY DATA: Lab Results  Component Value Date   WBC 4.4 02/06/2014   HGB 12.9 02/06/2014   HCT 38.7 02/06/2014   PLT 125.0* 02/06/2014   GLUCOSE 112* 02/06/2014   NA 132* 02/06/2014   K 3.6 02/06/2014   CL 96 02/06/2014  CREATININE 0.5 02/06/2014   BUN 7 02/06/2014   CO2 32 02/06/2014   TSH 0.00 Repeated and verified X2.* 02/06/2014   INR 2.3 09/19/2014     Assessment / Plan: 1. Chronic congestive heart failure -last echocardiogram showed significant improvement in her LV function. She has moderate mitral insufficiency. We'll continue her current therapy with carvedilol and ACE inhibitor. Continue current diuretic dose. I'll followup again in 6 months.  2. Atrial fibrillation. Rate is well controlled now. We had previously reduced her carvedilol dose because of bradycardia and this has improved. Continue anticoagulation with Coumadin. INR today pending.  3. Hypertension, controlled.

## 2014-11-14 ENCOUNTER — Ambulatory Visit (INDEPENDENT_AMBULATORY_CARE_PROVIDER_SITE_OTHER): Payer: Medicare Other | Admitting: *Deleted

## 2014-11-14 DIAGNOSIS — I482 Chronic atrial fibrillation, unspecified: Secondary | ICD-10-CM

## 2014-11-14 DIAGNOSIS — Z5181 Encounter for therapeutic drug level monitoring: Secondary | ICD-10-CM | POA: Diagnosis not present

## 2014-11-14 LAB — POCT INR: INR: 1.6

## 2014-11-30 ENCOUNTER — Ambulatory Visit (INDEPENDENT_AMBULATORY_CARE_PROVIDER_SITE_OTHER): Payer: Medicare Other

## 2014-11-30 DIAGNOSIS — Z5181 Encounter for therapeutic drug level monitoring: Secondary | ICD-10-CM

## 2014-11-30 DIAGNOSIS — I482 Chronic atrial fibrillation, unspecified: Secondary | ICD-10-CM

## 2014-11-30 DIAGNOSIS — I4891 Unspecified atrial fibrillation: Secondary | ICD-10-CM

## 2014-11-30 DIAGNOSIS — Z7901 Long term (current) use of anticoagulants: Secondary | ICD-10-CM | POA: Diagnosis not present

## 2014-11-30 LAB — POCT INR: INR: 1.8

## 2014-12-05 ENCOUNTER — Other Ambulatory Visit: Payer: Self-pay | Admitting: *Deleted

## 2014-12-05 MED ORDER — WARFARIN SODIUM 5 MG PO TABS: ORAL_TABLET | ORAL | Status: DC

## 2014-12-05 NOTE — Telephone Encounter (Signed)
Pt called asking refill on her coumadin. Pt instructed will call in refill as requested and she states understanding.

## 2014-12-14 ENCOUNTER — Ambulatory Visit (INDEPENDENT_AMBULATORY_CARE_PROVIDER_SITE_OTHER): Payer: Medicare Other | Admitting: *Deleted

## 2014-12-14 DIAGNOSIS — Z7901 Long term (current) use of anticoagulants: Secondary | ICD-10-CM | POA: Diagnosis not present

## 2014-12-14 DIAGNOSIS — I482 Chronic atrial fibrillation, unspecified: Secondary | ICD-10-CM

## 2014-12-14 DIAGNOSIS — Z5181 Encounter for therapeutic drug level monitoring: Secondary | ICD-10-CM

## 2014-12-14 DIAGNOSIS — I4891 Unspecified atrial fibrillation: Secondary | ICD-10-CM

## 2014-12-14 LAB — POCT INR: INR: 1.9

## 2014-12-28 ENCOUNTER — Ambulatory Visit (INDEPENDENT_AMBULATORY_CARE_PROVIDER_SITE_OTHER): Payer: Medicare Other | Admitting: *Deleted

## 2014-12-28 DIAGNOSIS — I482 Chronic atrial fibrillation, unspecified: Secondary | ICD-10-CM

## 2014-12-28 DIAGNOSIS — Z7901 Long term (current) use of anticoagulants: Secondary | ICD-10-CM

## 2014-12-28 DIAGNOSIS — Z5181 Encounter for therapeutic drug level monitoring: Secondary | ICD-10-CM

## 2014-12-28 DIAGNOSIS — I4891 Unspecified atrial fibrillation: Secondary | ICD-10-CM

## 2014-12-28 LAB — POCT INR: INR: 2.3

## 2015-01-18 ENCOUNTER — Ambulatory Visit (INDEPENDENT_AMBULATORY_CARE_PROVIDER_SITE_OTHER): Payer: Medicare Other | Admitting: *Deleted

## 2015-01-18 DIAGNOSIS — I482 Chronic atrial fibrillation, unspecified: Secondary | ICD-10-CM

## 2015-01-18 DIAGNOSIS — Z7901 Long term (current) use of anticoagulants: Secondary | ICD-10-CM

## 2015-01-18 DIAGNOSIS — Z5181 Encounter for therapeutic drug level monitoring: Secondary | ICD-10-CM | POA: Diagnosis not present

## 2015-01-18 DIAGNOSIS — I4891 Unspecified atrial fibrillation: Secondary | ICD-10-CM | POA: Diagnosis not present

## 2015-01-18 LAB — POCT INR: INR: 3.1

## 2015-02-08 ENCOUNTER — Ambulatory Visit (INDEPENDENT_AMBULATORY_CARE_PROVIDER_SITE_OTHER): Payer: Medicare Other

## 2015-02-08 DIAGNOSIS — Z5181 Encounter for therapeutic drug level monitoring: Secondary | ICD-10-CM

## 2015-02-08 DIAGNOSIS — Z7901 Long term (current) use of anticoagulants: Secondary | ICD-10-CM | POA: Diagnosis not present

## 2015-02-08 DIAGNOSIS — I4891 Unspecified atrial fibrillation: Secondary | ICD-10-CM

## 2015-02-08 DIAGNOSIS — I482 Chronic atrial fibrillation, unspecified: Secondary | ICD-10-CM

## 2015-02-08 LAB — POCT INR: INR: 3

## 2015-02-18 ENCOUNTER — Telehealth: Payer: Self-pay | Admitting: Cardiology

## 2015-02-18 NOTE — Telephone Encounter (Signed)
Called patient, informed of recommendations. She verb'd understanding.

## 2015-02-18 NOTE — Telephone Encounter (Signed)
Patient states she has had headache, nervous all the time, fatigued moreso than usual. Had fall ~6 weeks ago, noted problems started at this time. She has been checking BP semi-regularly. No recent elevations. She checked while on phone, noted BP of 136/76 w/ HR 66.  Advised to contact Dr. Clovis Riley (PCP) to better address the headache.  Pt also noted she is "still having some swelling in the ankles". She takes 20mg  lasix BID.  Not short of breath. Informed her I would route to Dr. Sw[place]il[place], see if OK to increase lasix temporarily; if any further suggestions regarding her headache. Pt agreeable to plan.

## 2015-02-18 NOTE — Telephone Encounter (Signed)
Gabriela Hernandez , have a headache and wanted to speak with a nurse about it , please call   Thanks

## 2015-02-18 NOTE — Telephone Encounter (Signed)
I agree with recommendations. I would not increase lasix at this time. Just monitor and restrict salt. Call if it is getting worse.  Anselm Aumiller Sw[place] MD, [facility]

## 2015-03-04 ENCOUNTER — Other Ambulatory Visit: Payer: Self-pay | Admitting: Family Medicine

## 2015-03-04 DIAGNOSIS — R51 Headache: Principal | ICD-10-CM

## 2015-03-04 DIAGNOSIS — R519 Headache, unspecified: Secondary | ICD-10-CM

## 2015-03-08 ENCOUNTER — Ambulatory Visit
Admission: RE | Admit: 2015-03-08 | Discharge: 2015-03-08 | Disposition: A | Discharge status: Home / Self Care | Payer: Medicare Other | Source: Ambulatory Visit | Attending: Family Medicine | Admitting: Family Medicine

## 2015-03-08 ENCOUNTER — Ambulatory Visit (INDEPENDENT_AMBULATORY_CARE_PROVIDER_SITE_OTHER): Payer: Medicare Other | Admitting: *Deleted

## 2015-03-08 DIAGNOSIS — I482 Chronic atrial fibrillation, unspecified: Secondary | ICD-10-CM

## 2015-03-08 DIAGNOSIS — Z7901 Long term (current) use of anticoagulants: Secondary | ICD-10-CM | POA: Diagnosis not present

## 2015-03-08 DIAGNOSIS — R519 Headache, unspecified: Secondary | ICD-10-CM

## 2015-03-08 DIAGNOSIS — I4891 Unspecified atrial fibrillation: Secondary | ICD-10-CM | POA: Diagnosis not present

## 2015-03-08 DIAGNOSIS — Z5181 Encounter for therapeutic drug level monitoring: Secondary | ICD-10-CM

## 2015-03-08 DIAGNOSIS — R51 Headache: Principal | ICD-10-CM

## 2015-03-08 LAB — POCT INR: INR: 2.5

## 2015-03-13 ENCOUNTER — Telehealth: Payer: Self-pay | Admitting: Cardiology

## 2015-03-13 MED ORDER — QUINAPRIL HCL 20 MG PO TABS: 10.0000 mg | ORAL_TABLET | Freq: Every day | ORAL | Status: DC

## 2015-03-13 NOTE — Telephone Encounter (Signed)
New message      STAT if patient is at the pharmacy , call can be transferred to refill team.   1. Which medications need to be refilled? Quinapril 2. Which pharmacy/location is medication to be sent to? [facility] 3. Do they need a 30 day or 90 day supply? 90 day supply

## 2015-04-09 ENCOUNTER — Ambulatory Visit (INDEPENDENT_AMBULATORY_CARE_PROVIDER_SITE_OTHER): Payer: Medicare Other | Admitting: *Deleted

## 2015-04-09 DIAGNOSIS — Z7901 Long term (current) use of anticoagulants: Secondary | ICD-10-CM | POA: Diagnosis not present

## 2015-04-09 DIAGNOSIS — Z5181 Encounter for therapeutic drug level monitoring: Secondary | ICD-10-CM | POA: Diagnosis not present

## 2015-04-09 DIAGNOSIS — I4891 Unspecified atrial fibrillation: Secondary | ICD-10-CM | POA: Diagnosis not present

## 2015-04-09 DIAGNOSIS — I482 Chronic atrial fibrillation, unspecified: Secondary | ICD-10-CM

## 2015-04-09 LAB — POCT INR: INR: 2.9

## 2015-05-01 ENCOUNTER — Encounter: Payer: Self-pay | Admitting: Cardiology

## 2015-05-01 ENCOUNTER — Ambulatory Visit (INDEPENDENT_AMBULATORY_CARE_PROVIDER_SITE_OTHER): Payer: Medicare Other | Admitting: Cardiology

## 2015-05-01 VITALS — BP 140/78 | HR 57 | Ht 66.0 in | Wt 131.2 lb

## 2015-05-01 DIAGNOSIS — I482 Chronic atrial fibrillation, unspecified: Secondary | ICD-10-CM

## 2015-05-01 DIAGNOSIS — I10 Essential (primary) hypertension: Secondary | ICD-10-CM | POA: Diagnosis not present

## 2015-05-01 DIAGNOSIS — I5022 Chronic systolic (congestive) heart failure: Secondary | ICD-10-CM | POA: Diagnosis not present

## 2015-05-01 DIAGNOSIS — I420 Dilated cardiomyopathy: Secondary | ICD-10-CM

## 2015-05-01 MED ORDER — LEVOTHYROXINE SODIUM 100 MCG PO TABS: 100.0000 ug | ORAL_TABLET | Freq: Every day | ORAL | Status: DC

## 2015-05-01 NOTE — Patient Instructions (Signed)
Continue your current therapy  Restrict your salt intake  I will see you in 6 months.   

## 2015-05-01 NOTE — Progress Notes (Signed)
Gabriela Hernandez Date of Birth: 1927/05/06   History of Present Illness: Gabriela Hernandez is seen today for followup  CHF. She has a history of nonischemic cardiomyopathy with prior echo demonstrated an ejection fraction of 30-35%. Echocardiogram in October 2014 showed significant improvement in her ejection fraction to 55-60%. She has moderate mitral insufficiency. She also has a history of chronic atrial fibrillation and is on coumadin.  On followup today she states she is doing well. She denies any chest pain.  She is not as active as she used to be and mainly just walks around her house. She fell in October and then had some HAs. CT of the head was negative. She has gained one lb since last visit. Notes she is eating out more since she doesn't have the strength to do a lot of cooking. She does have some dyspnea and notes she can't sing anymore. She has some swelling that is unchanged. Overall she feels she is doing well for her age.   Current Outpatient Prescriptions on File Prior to Visit  Medication Sig Dispense Refill  . carvedilol (COREG) 25 MG tablet Take 0.5 tablets (12.5 mg total) by mouth 2 (two) times daily with a meal. 90 tablet 6  . furosemide (LASIX) 20 MG tablet Take 1 tablet (20 mg total) by mouth 2 (two) times daily. 180 tablet 6  . Multiple Vitamins-Minerals (MULTIVITAMIN WITH MINERALS) tablet Take 1 tablet by mouth daily.    . quinapril (ACCUPRIL) 20 MG tablet Take 0.5 tablets (10 mg total) by mouth daily. 45 tablet 1  . warfarin (COUMADIN) 5 MG tablet Take as directed by anticoagulation clinic 120 tablet 1   No current facility-administered medications on file prior to visit.    No Known Allergies  Past Medical History  Diagnosis Date  . Dilated cardiomyopathy (HCC)     with ejection fraction of 30-35%  . Atrial fibrillation (HCC)   . Hypertension   . Hypothyroidism   . Lightheadedness   . History of shingles   . Chronic systolic CHF (congestive heart failure)  (HCC) 04/05/2012  . Dysphagia   . Esophageal stricture     Past Surgical History  Procedure Laterality Date  . Cataract extraction    . Vein ligation    . Thyroidectomy    . Foot surgery    . Tonsillectomy    . Appendectomy    . Cesarean section      History  Smoking status  . Never Smoker   Smokeless tobacco  . Not on file    History  Alcohol Use No    Family History  Problem Relation Age of Onset  . Hypertension Mother   . Heart attack Father     Review of Systems:  All other systems  as noted in history of present illness. All other systems were reviewed and are negative.  Physical Exam: BP 140/78 mmHg  Pulse 57  Ht  (1.676 m)  Wt 59.512 kg (131 lb 3.2 oz)  BMI 21.19 kg/m2 She is a pleasant elderly white female in no acute distress. HEENT exam is unremarkable.  Neck is supple no JVD, adenopathy, thyromegaly, or bruits. Lungs are clear. Cardiac exam reveals a grade 1-2/6 systolic murmur at apex. Pulse is irregular. There is no S3. Abdomen is soft and nontender without masses. She has 1+ edema. She does have diffuse superficial varicosities in her lower  extremities.   Pedal pulses are palpable. Neurologic exam is nonfocal.  LABORATORY DATA: Lab  Results  Component Value Date   WBC 4.4 02/06/2014   HGB 12.9 02/06/2014   HCT 38.7 02/06/2014   PLT 125.0* 02/06/2014   GLUCOSE 112* 02/06/2014   NA 132* 02/06/2014   K 3.6 02/06/2014   CL 96 02/06/2014   CREATININE 0.5 02/06/2014   BUN 7 02/06/2014   CO2 32 02/06/2014   TSH 0.00 Repeated and verified X2.* 02/06/2014   INR 2.9 04/09/2015   Ecg today shows AFib with rate 57. Old anteroseptal MI. I have personally reviewed and interpreted this study.   Assessment / Plan: 1. Chronic congestive heart failure -last echocardiogram showed good LV function. She has moderate mitral insufficiency. We'll continue her current therapy with carvedilol and ACE inhibitor. Continue current diuretic dose. She may need to  take extra lasix if she gets more sodium in diet. I will follow up in 6 months.  2. Atrial fibrillation. Rate is well controlled now. We had previously reduced her carvedilol dose because of bradycardia and this has improved. Continue anticoagulation with Coumadin.   3. Hypertension, controlled.

## 2015-05-21 ENCOUNTER — Ambulatory Visit (INDEPENDENT_AMBULATORY_CARE_PROVIDER_SITE_OTHER): Payer: Medicare Other | Admitting: Surgery

## 2015-05-21 DIAGNOSIS — Z7901 Long term (current) use of anticoagulants: Secondary | ICD-10-CM | POA: Diagnosis not present

## 2015-05-21 DIAGNOSIS — I4891 Unspecified atrial fibrillation: Secondary | ICD-10-CM

## 2015-05-21 DIAGNOSIS — I482 Chronic atrial fibrillation, unspecified: Secondary | ICD-10-CM

## 2015-05-21 DIAGNOSIS — Z5181 Encounter for therapeutic drug level monitoring: Secondary | ICD-10-CM

## 2015-05-21 LAB — POCT INR: INR: 2.8

## 2015-06-06 ENCOUNTER — Telehealth: Payer: Self-pay | Admitting: Cardiology

## 2015-06-06 NOTE — Telephone Encounter (Signed)
Claritin is fine with her medication profile

## 2015-06-06 NOTE — Telephone Encounter (Signed)
Mrs. Pates  Is calling about a medication for sneezing and runny nose (Clartin ) and wants to know if she should take this . Please Call  Thanks

## 2015-06-06 NOTE — Telephone Encounter (Signed)
Called patient and gave her the message from [place] that the Claritin will be fine to take

## 2015-06-06 NOTE — Telephone Encounter (Signed)
Called patient and told her that I would ask Gabriela Hernandez our Cardiology Pharmacist to check if it is acceptable to take Claritin or if there is something else she recommends

## 2015-07-02 ENCOUNTER — Ambulatory Visit (INDEPENDENT_AMBULATORY_CARE_PROVIDER_SITE_OTHER): Payer: Medicare Other | Admitting: *Deleted

## 2015-07-02 DIAGNOSIS — Z7901 Long term (current) use of anticoagulants: Secondary | ICD-10-CM

## 2015-07-02 DIAGNOSIS — I4891 Unspecified atrial fibrillation: Secondary | ICD-10-CM | POA: Diagnosis not present

## 2015-07-02 DIAGNOSIS — I482 Chronic atrial fibrillation, unspecified: Secondary | ICD-10-CM

## 2015-07-02 DIAGNOSIS — Z5181 Encounter for therapeutic drug level monitoring: Secondary | ICD-10-CM | POA: Diagnosis not present

## 2015-07-02 LAB — POCT INR: INR: 2.4

## 2015-07-03 ENCOUNTER — Other Ambulatory Visit: Payer: Self-pay | Admitting: Cardiology

## 2015-07-03 NOTE — Telephone Encounter (Signed)
Rx(s) sent to pharmacy electronically.  

## 2015-07-16 ENCOUNTER — Telehealth: Payer: Self-pay | Admitting: Cardiology

## 2015-07-16 MED ORDER — WARFARIN SODIUM 5 MG PO TABS: ORAL_TABLET | ORAL | Status: DC

## 2015-07-16 NOTE — Telephone Encounter (Signed)
New message       *STAT* If patient is at the pharmacy, call can be transferred to refill team.   1. Which medications need to be refilled? (please list name of each medication and dose if known) warfarin   5mg  2. Which pharmacy/location (including street and city if local pharmacy) is medication to be sent to? [email or web address] 3. Do they need a 30 day or 90 day supply? 120 tablets

## 2015-08-13 ENCOUNTER — Ambulatory Visit (INDEPENDENT_AMBULATORY_CARE_PROVIDER_SITE_OTHER): Payer: Medicare Other | Admitting: *Deleted

## 2015-08-13 DIAGNOSIS — Z7901 Long term (current) use of anticoagulants: Secondary | ICD-10-CM

## 2015-08-13 DIAGNOSIS — I4891 Unspecified atrial fibrillation: Secondary | ICD-10-CM | POA: Diagnosis not present

## 2015-08-13 DIAGNOSIS — I482 Chronic atrial fibrillation, unspecified: Secondary | ICD-10-CM

## 2015-08-13 DIAGNOSIS — Z5181 Encounter for therapeutic drug level monitoring: Secondary | ICD-10-CM

## 2015-08-13 LAB — POCT INR: INR: 3.1

## 2015-09-04 ENCOUNTER — Other Ambulatory Visit: Payer: Self-pay | Admitting: *Deleted

## 2015-09-04 MED ORDER — QUINAPRIL HCL 20 MG PO TABS: 10.0000 mg | ORAL_TABLET | Freq: Every day | ORAL | Status: DC

## 2015-09-24 ENCOUNTER — Ambulatory Visit (INDEPENDENT_AMBULATORY_CARE_PROVIDER_SITE_OTHER): Payer: Medicare Other | Admitting: *Deleted

## 2015-09-24 DIAGNOSIS — I4891 Unspecified atrial fibrillation: Secondary | ICD-10-CM

## 2015-09-24 DIAGNOSIS — Z5181 Encounter for therapeutic drug level monitoring: Secondary | ICD-10-CM

## 2015-09-24 DIAGNOSIS — I482 Chronic atrial fibrillation, unspecified: Secondary | ICD-10-CM

## 2015-09-24 DIAGNOSIS — Z7901 Long term (current) use of anticoagulants: Secondary | ICD-10-CM

## 2015-09-24 LAB — POCT INR: INR: 3.4

## 2015-10-15 ENCOUNTER — Ambulatory Visit (INDEPENDENT_AMBULATORY_CARE_PROVIDER_SITE_OTHER): Payer: Medicare Other

## 2015-10-15 DIAGNOSIS — Z5181 Encounter for therapeutic drug level monitoring: Secondary | ICD-10-CM

## 2015-10-15 DIAGNOSIS — I4891 Unspecified atrial fibrillation: Secondary | ICD-10-CM | POA: Diagnosis not present

## 2015-10-15 DIAGNOSIS — I482 Chronic atrial fibrillation, unspecified: Secondary | ICD-10-CM

## 2015-10-15 DIAGNOSIS — Z7901 Long term (current) use of anticoagulants: Secondary | ICD-10-CM | POA: Diagnosis not present

## 2015-10-15 LAB — POCT INR: INR: 2.9

## 2015-10-29 ENCOUNTER — Ambulatory Visit (INDEPENDENT_AMBULATORY_CARE_PROVIDER_SITE_OTHER): Payer: Medicare Other | Admitting: Cardiology

## 2015-10-29 ENCOUNTER — Encounter: Payer: Self-pay | Admitting: Cardiology

## 2015-10-29 VITALS — BP 132/82 | HR 66 | Ht 66.0 in | Wt 126.5 lb

## 2015-10-29 DIAGNOSIS — I10 Essential (primary) hypertension: Secondary | ICD-10-CM

## 2015-10-29 DIAGNOSIS — I5022 Chronic systolic (congestive) heart failure: Secondary | ICD-10-CM

## 2015-10-29 DIAGNOSIS — I420 Dilated cardiomyopathy: Secondary | ICD-10-CM

## 2015-10-29 DIAGNOSIS — I482 Chronic atrial fibrillation, unspecified: Secondary | ICD-10-CM

## 2015-10-29 NOTE — Patient Instructions (Signed)
Continue your current therapy  I will see you in 6 months.   

## 2015-10-29 NOTE — Progress Notes (Signed)
Gabriela Hernandez Date of Birth: 1927/04/03   History of Present Illness: Gabriela Hernandez is seen today for followup  CHF. She has a history of nonischemic cardiomyopathy with prior echo demonstrated an ejection fraction of 30-35%. Echocardiogram in October 2014 showed significant improvement in her ejection fraction to 55-60%. She has moderate mitral insufficiency. She also has a history of chronic atrial fibrillation and is on coumadin.  On followup today she states she is doing well. She denies any chest pain.  She denies dyspnea or swelling.  She has lost 5 lbs since last visit. She is concerned about some red lesions on her legs and is followed by dermatology.   Current Outpatient Prescriptions on File Prior to Visit  Medication Sig Dispense Refill  . carvedilol (COREG) 25 MG tablet TAKE 1/2 TABLET BY MOUTH TWICE DAILY WITH A MEAL 90 tablet 2  . furosemide (LASIX) 20 MG tablet TAKE 1 TABLET BY MOUTH TWICE DAILY 180 tablet 2  . levothyroxine (SYNTHROID, LEVOTHROID) 112 MCG tablet Take 112 mcg by mouth daily before breakfast.    . Multiple Vitamins-Minerals (MULTIVITAMIN WITH MINERALS) tablet Take 1 tablet by mouth daily.    . Multiple Vitamins-Minerals (PRESERVISION AREDS 2) CAPS Take by mouth. Take 1 soft gel tablet daily    . quinapril (ACCUPRIL) 20 MG tablet Take 0.5 tablets (10 mg total) by mouth daily. 45 tablet 2  . warfarin (COUMADIN) 5 MG tablet Take 1-1.5 tablets by mouth daily as directed by coumadin clinic 120 tablet 1   No current facility-administered medications on file prior to visit.    No Known Allergies  Past Medical History  Diagnosis Date  . Dilated cardiomyopathy (HCC)     with ejection fraction of 30-35%  . Atrial fibrillation (HCC)   . Hypertension   . Hypothyroidism   . Lightheadedness   . History of shingles   . Chronic systolic CHF (congestive heart failure) (HCC) 04/05/2012  . Dysphagia   . Esophageal stricture     Past Surgical History  Procedure  Laterality Date  . Cataract extraction    . Vein ligation    . Thyroidectomy    . Foot surgery    . Tonsillectomy    . Appendectomy    . Cesarean section      History  Smoking status  . Never Smoker   Smokeless tobacco  . Not on file    History  Alcohol Use No    Family History  Problem Relation Age of Onset  . Hypertension Mother   . Heart attack Father     Review of Systems:  All other systems  as noted in history of present illness. All other systems were reviewed and are negative.  Physical Exam: BP 132/82 mmHg  Pulse 66  Ht 5\' 6"  (1.676 m)  Wt 126 lb 8 oz (57.38 kg)  BMI 20.43 kg/m2 She is a pleasant elderly white female in no acute distress. HEENT exam is unremarkable.  Neck is supple no JVD, adenopathy, thyromegaly, or bruits. Lungs are clear. Cardiac exam reveals a grade 1-2/6 systolic murmur at apex. Pulse is irregular. There is no S3. Abdomen is soft and nontender without masses. She has tr edema. She does have diffuse superficial varicosities in her lower  Extremities and some macular lesions.   Pedal pulses are palpable. Neurologic exam is nonfocal.  LABORATORY DATA: Lab Results  Component Value Date   WBC 4.4 02/06/2014   HGB 12.9 02/06/2014   HCT 38.7 02/06/2014  PLT 125.0* 02/06/2014   GLUCOSE 112* 02/06/2014   NA 132* 02/06/2014   K 3.6 02/06/2014   CL 96 02/06/2014   CREATININE 0.5 02/06/2014   BUN 7 02/06/2014   CO2 32 02/06/2014   TSH 0.00 Repeated and verified X2.* 02/06/2014   INR 2.9 10/15/2015    Assessment / Plan: 1. Chronic congestive heart failure -last echocardiogram showed good LV function. She has moderate mitral insufficiency. We'll continue her current therapy with carvedilol and ACE inhibitor. Continue current diuretic dose. I have requested a copy of her last lab work from Dr. Clovis Riley.  I will follow up in 6 months.  2. Atrial fibrillation. Rate is well controlled now. We had previously reduced her carvedilol dose because  of bradycardia and this has improved. Continue anticoagulation with Coumadin.   3. Hypertension, controlled.

## 2015-11-12 ENCOUNTER — Ambulatory Visit (INDEPENDENT_AMBULATORY_CARE_PROVIDER_SITE_OTHER): Payer: Medicare Other | Admitting: Pharmacist

## 2015-11-12 DIAGNOSIS — I4891 Unspecified atrial fibrillation: Secondary | ICD-10-CM

## 2015-11-12 DIAGNOSIS — Z5181 Encounter for therapeutic drug level monitoring: Secondary | ICD-10-CM

## 2015-11-12 DIAGNOSIS — Z7901 Long term (current) use of anticoagulants: Secondary | ICD-10-CM

## 2015-11-12 DIAGNOSIS — I482 Chronic atrial fibrillation, unspecified: Secondary | ICD-10-CM

## 2015-11-12 LAB — POCT INR: INR: 2.5

## 2015-11-20 ENCOUNTER — Encounter: Payer: Self-pay | Admitting: Cardiology

## 2015-11-21 ENCOUNTER — Telehealth: Payer: Self-pay | Admitting: Cardiology

## 2015-11-21 NOTE — Telephone Encounter (Signed)
Returned call to patient Gabriela Hernandez's advice given.

## 2015-11-21 NOTE — Telephone Encounter (Signed)
No problem with any of her other medications.

## 2015-11-21 NOTE — Telephone Encounter (Signed)
Returned call to patient.She stated she was prescribed Trentinoin cream apply 3 times a week to face at bedtime for dark spots on face.Stated she wanted to make sure this cream will not interfere with her other medications.Message sent to pharmacist for advice.

## 2015-11-21 NOTE — Telephone Encounter (Signed)
New message   Pt wanting to speak to the rn   Another doctor gave her a prescription and she wants to speak to the rn so she knows that it is okay to take  Cream for her face, its called Tretinoin 25.mg

## 2015-12-11 ENCOUNTER — Ambulatory Visit (INDEPENDENT_AMBULATORY_CARE_PROVIDER_SITE_OTHER): Payer: Medicare Other | Admitting: *Deleted

## 2015-12-11 DIAGNOSIS — I4891 Unspecified atrial fibrillation: Secondary | ICD-10-CM

## 2015-12-11 DIAGNOSIS — Z7901 Long term (current) use of anticoagulants: Secondary | ICD-10-CM

## 2015-12-11 DIAGNOSIS — Z5181 Encounter for therapeutic drug level monitoring: Secondary | ICD-10-CM

## 2015-12-11 LAB — POCT INR: INR: 2.4

## 2016-01-01 ENCOUNTER — Telehealth: Payer: Self-pay | Admitting: Cardiology

## 2016-01-01 NOTE — Telephone Encounter (Signed)
New message      Pt states that she is having side effects of med such as swelling in face, breaking out on legs and not healing, makes her dizzy, tired and stomach pains. Medicines are carvedilol 25mg , furosemide 20mg . Please call.     Pt c/o swelling: STAT is pt has developed SOB within 24 hours  1. How long have you been experiencing swelling? A while  2. Where is the swelling located? In legs and face  3.  Are you currently taking a "fluid pill"? yes  4.  Are you currently SOB? yes  5.  Have you traveled recently? no

## 2016-01-01 NOTE — Telephone Encounter (Signed)
Spoke with pt, she is concerned about the rash that is not healing on her legs. She reports the dermatologist does not know what it is or where it is coming from. According to the paper from the pharmacy, the problems she is having are listed for both the carvedilol and furosemide. She would like to know what she is supposed to do. She does not want to take these medications if they are cause this problem. Will forward to dr [place] to review and advise.

## 2016-01-05 NOTE — Telephone Encounter (Signed)
I think we would need more clarification from dermatology about these lesions before stopping or changing her meds. The lesions she noted in June were on her lower extremities only and typically a medication reaction would be more diffuse. Can we get records from dermatology?  Peter [place] MD, [facility]

## 2016-01-07 NOTE — Telephone Encounter (Signed)
Called patient to let know of MD [place] recommendations at this time and get information regarding her dermatologist to obtain records-pt reports she spoke to [facility] yesterday and that the records from her dermatologist have already been received and was told her rash is not from the medications she is taking.  Will send to [facility] to clarify (no telephone note noted at this time).

## 2016-01-10 NOTE — Telephone Encounter (Signed)
Spoke to patient 01/08/16.Dr.Jordan reviewed dermatologist's record's.He advised rash not coming from medications.Advised to continue all medications.

## 2016-01-15 ENCOUNTER — Ambulatory Visit (INDEPENDENT_AMBULATORY_CARE_PROVIDER_SITE_OTHER): Payer: Medicare Other | Admitting: *Deleted

## 2016-01-15 DIAGNOSIS — I4891 Unspecified atrial fibrillation: Secondary | ICD-10-CM | POA: Diagnosis not present

## 2016-01-15 DIAGNOSIS — Z5181 Encounter for therapeutic drug level monitoring: Secondary | ICD-10-CM | POA: Diagnosis not present

## 2016-01-15 DIAGNOSIS — Z7901 Long term (current) use of anticoagulants: Secondary | ICD-10-CM

## 2016-01-15 LAB — POCT INR: INR: 2.6

## 2016-02-26 ENCOUNTER — Ambulatory Visit (INDEPENDENT_AMBULATORY_CARE_PROVIDER_SITE_OTHER): Payer: Medicare Other | Admitting: *Deleted

## 2016-02-26 DIAGNOSIS — Z7901 Long term (current) use of anticoagulants: Secondary | ICD-10-CM

## 2016-02-26 DIAGNOSIS — I4891 Unspecified atrial fibrillation: Secondary | ICD-10-CM | POA: Diagnosis not present

## 2016-02-26 DIAGNOSIS — Z5181 Encounter for therapeutic drug level monitoring: Secondary | ICD-10-CM | POA: Diagnosis not present

## 2016-02-26 LAB — POCT INR: INR: 2.2

## 2016-03-09 ENCOUNTER — Other Ambulatory Visit: Payer: Self-pay | Admitting: *Deleted

## 2016-03-09 MED ORDER — WARFARIN SODIUM 5 MG PO TABS: ORAL_TABLET | ORAL | 1 refills | Status: DC

## 2016-03-26 ENCOUNTER — Telehealth: Payer: Self-pay | Admitting: Cardiology

## 2016-03-26 NOTE — Telephone Encounter (Signed)
I spoke with patient. She states she would like to know if Cephalexin is safe to take with her other medications. I spoke with Margaretmary Dys, RPh. Per Aundra Millet, it is ok with her current medications.

## 2016-03-26 NOTE — Telephone Encounter (Signed)
Pt has fluid in legs and was put on Cephalexine 500mg  BID x 10 days -per Dr. Terri Piedra . Pt Wants to make sure not an interaction with any of her other meds  pls call [phone]

## 2016-03-27 ENCOUNTER — Other Ambulatory Visit: Payer: Self-pay | Admitting: Cardiology

## 2016-04-08 ENCOUNTER — Ambulatory Visit (INDEPENDENT_AMBULATORY_CARE_PROVIDER_SITE_OTHER): Payer: Medicare Other | Admitting: *Deleted

## 2016-04-08 DIAGNOSIS — I4891 Unspecified atrial fibrillation: Secondary | ICD-10-CM | POA: Diagnosis not present

## 2016-04-08 DIAGNOSIS — Z5181 Encounter for therapeutic drug level monitoring: Secondary | ICD-10-CM | POA: Diagnosis not present

## 2016-04-08 DIAGNOSIS — Z7901 Long term (current) use of anticoagulants: Secondary | ICD-10-CM | POA: Diagnosis not present

## 2016-04-08 LAB — POCT INR: INR: 2.2

## 2016-04-23 ENCOUNTER — Encounter: Payer: Self-pay | Admitting: Cardiology

## 2016-04-25 NOTE — Progress Notes (Signed)
Gabriela ChattersEunice E Hernandez Date of Birth: 11/04/1926   History of Present Illness: [name] is seen today for followup CHF. She has a history of nonischemic cardiomyopathy with prior echo demonstrated an ejection fraction of 30-35%. Echocardiogram in October 2014 showed significant improvement in her ejection fraction to 55-60%. She has moderate mitral insufficiency. She also has a history of chronic atrial fibrillation and is on coumadin.   On followup today she reports increased LE edema. She has been using support hose but states she cannot put them on so her son helps her. Notes increased SOB. Weight is stable. No chest pain or dizziness.   Current Outpatient Prescriptions on File Prior to Visit  Medication Sig Dispense Refill  . carvedilol (COREG) 25 MG tablet TAKE 1/2 TABLET BY MOUTH TWICE DAILY WITH A MEAL 90 tablet 0  . furosemide (LASIX) 20 MG tablet TAKE 1 TABLET BY MOUTH TWICE DAILY 180 tablet 0  . levothyroxine (SYNTHROID, LEVOTHROID) 112 MCG tablet Take 112 mcg by mouth daily before breakfast.    . Multiple Vitamins-Minerals (MULTIVITAMIN WITH MINERALS) tablet Take 1 tablet by mouth daily.    . Multiple Vitamins-Minerals (PRESERVISION AREDS 2) CAPS Take by mouth. Take 1 soft gel tablet daily    . quinapril (ACCUPRIL) 20 MG tablet Take 0.5 tablets (10 mg total) by mouth daily. 45 tablet 2   No current facility-administered medications on file prior to visit.     No Known Allergies  Past Medical History:  Diagnosis Date  . Atrial fibrillation (HCC)   . Chronic systolic CHF (congestive heart failure) (HCC) 04/05/2012  . Dilated cardiomyopathy (HCC)    with ejection fraction of 30-35%  . Dysphagia   . Esophageal stricture   . History of shingles   . Hypertension   . Hypothyroidism   . Lightheadedness     Past Surgical History:  Procedure Laterality Date  . APPENDECTOMY    . CATARACT EXTRACTION    . CESAREAN SECTION    . FOOT SURGERY    . THYROIDECTOMY    .  TONSILLECTOMY    . VEIN LIGATION      History  Smoking Status  . Never Smoker  Smokeless Tobacco  . Never Used    History  Alcohol Use No    Family History  Problem Relation Age of Onset  . Hypertension Mother   . Heart attack Father     Review of Systems:  All other systems  as noted in history of present illness. All other systems were reviewed and are negative.  Physical Exam: BP (!) 146/80   Pulse (!) 59   Ht 5\' 6"  (1.676 m)   Wt 126 lb (57.2 kg)   BMI 20.34 kg/m  She is a pleasant elderly white female in no acute distress. HEENT exam is unremarkable.  Neck is supple with  JVD 10 cm, adenopathy, thyromegaly, or bruits. Lungs are clear. Cardiac exam reveals a grade 1-2/6 systolic murmur at apex. Pulse is irregular. There is no S3. Abdomen is soft and nontender without masses. She has 2+ edema R>L. She does have diffuse superficial varicosities in her lower  Extremities and some macular lesions.   Pedal pulses are palpable. Neurologic exam is nonfocal.  LABORATORY DATA: Lab Results  Component Value Date   WBC 4.4 02/06/2014   HGB 12.9 02/06/2014   HCT 38.7 02/06/2014   PLT 125.0 (L) 02/06/2014   GLUCOSE 112 (H) 02/06/2014   NA 132 (L) 02/06/2014   K 3.6 02/06/2014  CL 96 02/06/2014   CREATININE 0.5 02/06/2014   BUN 7 02/06/2014   CO2 32 02/06/2014   TSH 0.00 Repeated and verified X2. (L) 02/06/2014   INR 2.2 04/08/2016   Labs dated 01/28/16: Normal BMET and TSH.  Ecg today shows atrial fibrillation with rate 59. Old anterolateral infarct pattern. No acute change. I have personally reviewed and interpreted this study.   Assessment / Plan: 1. Chronic congestive heart failure -last echocardiogram showed good LV function but she has increased dyspnea an LE edema.  She has moderate mitral insufficiency. Recommend repeat Echo. Will increase lasix to 40 mg in am and 20 mg in pm. Stop accupril and in 3 days start Entresto 24/26 mg bid. Repeat BMET and BNP in 10 days.  I will follow up in one month.  2. Atrial fibrillation. Rate is well controlled now. We had previously reduced her carvedilol dose because of bradycardia and this has improved. Continue anticoagulation with Coumadin.   3. Hypertension, controlled.

## 2016-04-28 ENCOUNTER — Ambulatory Visit (INDEPENDENT_AMBULATORY_CARE_PROVIDER_SITE_OTHER): Payer: Medicare Other | Admitting: Cardiology

## 2016-04-28 ENCOUNTER — Encounter: Payer: Self-pay | Admitting: Cardiology

## 2016-04-28 VITALS — BP 146/80 | HR 59 | Ht 66.0 in | Wt 126.0 lb

## 2016-04-28 DIAGNOSIS — R0602 Shortness of breath: Secondary | ICD-10-CM

## 2016-04-28 DIAGNOSIS — I482 Chronic atrial fibrillation: Secondary | ICD-10-CM

## 2016-04-28 DIAGNOSIS — I5022 Chronic systolic (congestive) heart failure: Secondary | ICD-10-CM | POA: Diagnosis not present

## 2016-04-28 DIAGNOSIS — I420 Dilated cardiomyopathy: Secondary | ICD-10-CM | POA: Diagnosis not present

## 2016-04-28 DIAGNOSIS — I4821 Permanent atrial fibrillation: Secondary | ICD-10-CM

## 2016-04-28 DIAGNOSIS — I10 Essential (primary) hypertension: Secondary | ICD-10-CM

## 2016-04-28 NOTE — Patient Instructions (Addendum)
Increase lasix to 40 mg in the morning and 20 mg in the evening.  Stop taking Quinapril ( Accupril)   On December 15 start Entresto 24/26 mg twice a day  We will check blood work in one week  We will schedule you for an Echocardiogram  We will follow up in one month.

## 2016-05-12 ENCOUNTER — Telehealth: Payer: Self-pay | Admitting: Cardiology

## 2016-05-12 NOTE — Telephone Encounter (Signed)
New messdage    Pt c/o medication issue:  1. Name of Medication: entresto  2. How are you currently taking this medication (dosage and times per day)? 2xday   3. Are you having a reaction (difficulty breathing--STAT)?  no  4. What is your medication issue? Upsetting stomach, loose bm

## 2016-05-12 NOTE — Telephone Encounter (Signed)
Spoke to patient. She needed samples of Entresto and these were placed at front desk - caller aware. She started this med 2 weeks ago at last visit, states med is working well, she feels much better on it, but concerned - reporting loose BMs for last 1 week.  She also wonders if this could be related to furosemide - notes this dose was increased at her last visit.  Advised patient to continue med therapy as prescribed, will seek recommendations on this and follow up w her. She voiced understanding and thanks.

## 2016-05-12 NOTE — Telephone Encounter (Signed)
Diarrhea is rare, but reported with valsartan (one of the components in [place]). It is also listed with Lasix rarely. I agree with current recommendations. Would continue therapy as prescribed and monitor if diarrhea persists into next week/ becomes worse we could try backing down on lasix dose again.

## 2016-05-12 NOTE — Telephone Encounter (Signed)
Spoke to patient. She's in agreement to wait on med changes for now, but will call me if she experiences worsening of symptoms or if no improvement after 1 week.

## 2016-05-20 ENCOUNTER — Other Ambulatory Visit: Payer: Self-pay

## 2016-05-20 ENCOUNTER — Ambulatory Visit (INDEPENDENT_AMBULATORY_CARE_PROVIDER_SITE_OTHER): Payer: Medicare Other | Admitting: *Deleted

## 2016-05-20 ENCOUNTER — Ambulatory Visit (HOSPITAL_COMMUNITY): Payer: Medicare Other | Attending: Cardiovascular Disease

## 2016-05-20 DIAGNOSIS — I10 Essential (primary) hypertension: Secondary | ICD-10-CM | POA: Diagnosis not present

## 2016-05-20 DIAGNOSIS — I130 Hypertensive heart and chronic kidney disease with heart failure and stage 1 through stage 4 chronic kidney disease, or unspecified chronic kidney disease: Secondary | ICD-10-CM | POA: Diagnosis not present

## 2016-05-20 DIAGNOSIS — R0602 Shortness of breath: Secondary | ICD-10-CM | POA: Insufficient documentation

## 2016-05-20 DIAGNOSIS — Z5181 Encounter for therapeutic drug level monitoring: Secondary | ICD-10-CM

## 2016-05-20 DIAGNOSIS — R482 Apraxia: Secondary | ICD-10-CM | POA: Insufficient documentation

## 2016-05-20 DIAGNOSIS — I313 Pericardial effusion (noninflammatory): Secondary | ICD-10-CM | POA: Diagnosis not present

## 2016-05-20 DIAGNOSIS — Z7901 Long term (current) use of anticoagulants: Secondary | ICD-10-CM

## 2016-05-20 DIAGNOSIS — I4821 Permanent atrial fibrillation: Secondary | ICD-10-CM

## 2016-05-20 DIAGNOSIS — I420 Dilated cardiomyopathy: Secondary | ICD-10-CM | POA: Insufficient documentation

## 2016-05-20 DIAGNOSIS — I4891 Unspecified atrial fibrillation: Secondary | ICD-10-CM | POA: Diagnosis not present

## 2016-05-20 DIAGNOSIS — I482 Chronic atrial fibrillation: Secondary | ICD-10-CM

## 2016-05-20 DIAGNOSIS — I5022 Chronic systolic (congestive) heart failure: Secondary | ICD-10-CM | POA: Insufficient documentation

## 2016-05-20 DIAGNOSIS — I340 Nonrheumatic mitral (valve) insufficiency: Secondary | ICD-10-CM | POA: Insufficient documentation

## 2016-05-20 DIAGNOSIS — I071 Rheumatic tricuspid insufficiency: Secondary | ICD-10-CM | POA: Insufficient documentation

## 2016-05-20 DIAGNOSIS — I371 Nonrheumatic pulmonary valve insufficiency: Secondary | ICD-10-CM | POA: Insufficient documentation

## 2016-05-20 LAB — POCT INR: INR: 3.4

## 2016-05-30 NOTE — Progress Notes (Deleted)
Gabriela Hernandez Date of Birth: 01-05-1927   History of Present Illness: Mrs. Oquendo is seen today for followup CHF. She has a history of nonischemic cardiomyopathy with prior echo demonstrated an ejection fraction of 30-35%. Echocardiogram in October 2014 showed significant improvement in her ejection fraction to 55-60%. She has moderate mitral insufficiency. She also has a history of chronic atrial fibrillation and is on coumadin.  When seen in December she had increased LE edema. She has been using support hose but states she cannot put them on so her son helps her. Notes increased SOB. Weight is stable. No chest pain or dizziness. Her lasix was increased and she was switched from Accupril to [place]. Echo was ordered and showed normal LV function with mild to moderate MR and severe LAE.   Current Outpatient Prescriptions on File Prior to Visit  Medication Sig Dispense Refill  . carvedilol (COREG) 25 MG tablet TAKE 1/2 TABLET BY MOUTH TWICE DAILY WITH A MEAL 90 tablet 0  . furosemide (LASIX) 20 MG tablet TAKE 1 TABLET BY MOUTH TWICE DAILY 180 tablet 0  . levothyroxine (SYNTHROID, LEVOTHROID) 112 MCG tablet Take 112 mcg by mouth daily before breakfast.    . Multiple Vitamins-Minerals (MULTIVITAMIN WITH MINERALS) tablet Take 1 tablet by mouth daily.    . Multiple Vitamins-Minerals (PRESERVISION AREDS 2) CAPS Take by mouth. Take 1 soft gel tablet daily    . quinapril (ACCUPRIL) 20 MG tablet Take 0.5 tablets (10 mg total) by mouth daily. 45 tablet 2  . triamcinolone cream (KENALOG) 0.1 % Apply 1 application topically as directed.    . warfarin (COUMADIN) 5 MG tablet Take 5 mg by mouth as directed.     No current facility-administered medications on file prior to visit.     No Known Allergies  Past Medical History:  Diagnosis Date  . Atrial fibrillation (HCC)   . Chronic systolic CHF (congestive heart failure) (HCC) 04/05/2012  . Dilated cardiomyopathy (HCC)    with ejection  fraction of 30-35%  . Dysphagia   . Esophageal stricture   . History of shingles   . Hypertension   . Hypothyroidism   . Lightheadedness     Past Surgical History:  Procedure Laterality Date  . APPENDECTOMY    . CATARACT EXTRACTION    . CESAREAN SECTION    . FOOT SURGERY    . THYROIDECTOMY    . TONSILLECTOMY    . VEIN LIGATION      History  Smoking Status  . Never Smoker  Smokeless Tobacco  . Never Used    History  Alcohol Use No    Family History  Problem Relation Age of Onset  . Hypertension Mother   . Heart attack Father     Review of Systems:  All other systems  as noted in history of present illness. All other systems were reviewed and are negative.  Physical Exam: There were no vitals taken for this visit. She is a pleasant elderly white female in no acute distress. HEENT exam is unremarkable.  Neck is supple with  JVD 10 cm, adenopathy, thyromegaly, or bruits. Lungs are clear. Cardiac exam reveals a grade 1-2/6 systolic murmur at apex. Pulse is irregular. There is no S3. Abdomen is soft and nontender without masses. She has 2+ edema R>L. She does have diffuse superficial varicosities in her lower  Extremities and some macular lesions.   Pedal pulses are palpable. Neurologic exam is nonfocal.  LABORATORY DATA: Lab Results  Component Value  Date   WBC 4.4 02/06/2014   HGB 12.9 02/06/2014   HCT 38.7 02/06/2014   PLT 125.0 (L) 02/06/2014   GLUCOSE 112 (H) 02/06/2014   NA 132 (L) 02/06/2014   K 3.6 02/06/2014   CL 96 02/06/2014   CREATININE 0.5 02/06/2014   BUN 7 02/06/2014   CO2 32 02/06/2014   TSH 0.00 Repeated and verified X2. (L) 02/06/2014   INR 3.4 05/20/2016   Labs dated 01/28/16: Normal BMET and TSH.  Ecg today shows atrial fibrillation with rate 59. Old anterolateral infarct pattern. No acute change. I have personally reviewed and interpreted this study.  Echo: 05/20/16: Study Conclusions  - Left ventricle: Systolic function was normal. The  estimated   ejection fraction was in the range of 55% to 60%. Wall motion was   normal; there were no regional wall motion abnormalities. - Mitral valve: There was mild to moderate regurgitation directed   centrally. - Left atrium: The atrium was massively dilated. - Right atrium: The atrium was mildly dilated. - Atrial septum: The septum bowed from left to right, consistent   with increased left atrial pressure. No defect or patent foramen   ovale was identified. - Pericardium, extracardiac: A small pericardial effusion was   identified posterior to the heart. There was no evidence of   hemodynamic compromise.  Assessment / Plan: 1. Chronic diastolic congestive heart failure - She has moderate mitral insufficiency. Recommend repeat Echo. Will increase lasix to 40 mg in am and 20 mg in pm. Stop accupril and in 3 days start Entresto 24/26 mg bid. Repeat BMET and BNP in 10 days. I will follow up in one month.  2. Atrial fibrillation. Rate is well controlled now. We had previously reduced her carvedilol dose because of bradycardia and this has improved. Continue anticoagulation with Coumadin.   3. Hypertension, controlled.

## 2016-06-04 ENCOUNTER — Ambulatory Visit: Payer: Medicare Other | Admitting: Cardiology

## 2016-06-05 ENCOUNTER — Encounter: Payer: Self-pay | Admitting: Cardiology

## 2016-06-05 ENCOUNTER — Ambulatory Visit (INDEPENDENT_AMBULATORY_CARE_PROVIDER_SITE_OTHER): Payer: Medicare Other | Admitting: Cardiology

## 2016-06-05 ENCOUNTER — Ambulatory Visit (INDEPENDENT_AMBULATORY_CARE_PROVIDER_SITE_OTHER): Payer: Medicare Other | Admitting: Pharmacist

## 2016-06-05 VITALS — BP 123/73 | HR 61 | Ht 66.0 in | Wt 124.6 lb

## 2016-06-05 DIAGNOSIS — I482 Chronic atrial fibrillation, unspecified: Secondary | ICD-10-CM

## 2016-06-05 DIAGNOSIS — I10 Essential (primary) hypertension: Secondary | ICD-10-CM

## 2016-06-05 DIAGNOSIS — I5022 Chronic systolic (congestive) heart failure: Secondary | ICD-10-CM | POA: Diagnosis not present

## 2016-06-05 DIAGNOSIS — I420 Dilated cardiomyopathy: Secondary | ICD-10-CM | POA: Diagnosis not present

## 2016-06-05 DIAGNOSIS — I4891 Unspecified atrial fibrillation: Secondary | ICD-10-CM

## 2016-06-05 DIAGNOSIS — Z5181 Encounter for therapeutic drug level monitoring: Secondary | ICD-10-CM

## 2016-06-05 DIAGNOSIS — Z7901 Long term (current) use of anticoagulants: Secondary | ICD-10-CM | POA: Diagnosis not present

## 2016-06-05 LAB — BASIC METABOLIC PANEL
BUN: 11 mg/dL (ref 7–25)
CHLORIDE: 99 mmol/L (ref 98–110)
CO2: 34 mmol/L — AB (ref 20–31)
CREATININE: 0.62 mg/dL (ref 0.60–0.88)
Calcium: 9.9 mg/dL (ref 8.6–10.4)
GLUCOSE: 68 mg/dL (ref 65–99)
Potassium: 4.6 mmol/L (ref 3.5–5.3)
Sodium: 138 mmol/L (ref 135–146)

## 2016-06-05 LAB — POCT INR: INR: 3.6

## 2016-06-05 MED ORDER — QUINAPRIL HCL 20 MG PO TABS: 10.0000 mg | ORAL_TABLET | Freq: Every day | ORAL | 2 refills | Status: DC

## 2016-06-05 NOTE — Progress Notes (Signed)
Gabriela Hernandez Date of Birth: 14-Feb-1927   History of Present Illness: Gabriela Hernandez is seen today for followup CHF. She has a history of nonischemic cardiomyopathy with prior echo demonstrated an ejection fraction of 30-35%. Echocardiogram in October 2014 showed significant improvement in her ejection fraction to 55-60%. She has moderate mitral insufficiency. She also has a history of chronic atrial fibrillation and is on coumadin.  When seen in December she noted  increased LE edema and SOB. Her lasix dose was increased and she was switched to [facility].  No chest pain or dizziness. On follow up she dose note less swelling. Her breathing is about the same. She is trying to wear compression hose more. No cough. Repeat Echo done as noted below.  Current Outpatient Prescriptions on File Prior to Visit  Medication Sig Dispense Refill  . carvedilol (COREG) 25 MG tablet TAKE 1/2 TABLET BY MOUTH TWICE DAILY WITH A MEAL 90 tablet 0  . levothyroxine (SYNTHROID, LEVOTHROID) 112 MCG tablet Take 112 mcg by mouth daily before breakfast.    . Multiple Vitamins-Minerals (MULTIVITAMIN WITH MINERALS) tablet Take 1 tablet by mouth daily.    . Multiple Vitamins-Minerals (PRESERVISION AREDS 2) CAPS Take by mouth. Take 1 soft gel tablet daily    . triamcinolone cream (KENALOG) 0.1 % Apply 1 application topically as directed.    . warfarin (COUMADIN) 5 MG tablet Take 5 mg by mouth as directed.     No current facility-administered medications on file prior to visit.     No Known Allergies  Past Medical History:  Diagnosis Date  . Atrial fibrillation (HCC)   . Chronic systolic CHF (congestive heart failure) (HCC) 04/05/2012  . Dilated cardiomyopathy (HCC)    with ejection fraction of 30-35%  . Dysphagia   . Esophageal stricture   . History of shingles   . Hypertension   . Hypothyroidism   . Lightheadedness     Past Surgical History:  Procedure Laterality Date  . APPENDECTOMY    . CATARACT  EXTRACTION    . CESAREAN SECTION    . FOOT SURGERY    . THYROIDECTOMY    . TONSILLECTOMY    . VEIN LIGATION      History  Smoking Status  . Never Smoker  Smokeless Tobacco  . Never Used    History  Alcohol Use No    Family History  Problem Relation Age of Onset  . Hypertension Mother   . Heart attack Father     Review of Systems:  All other systems  as noted in history of present illness. All other systems were reviewed and are negative.  Physical Exam: BP 123/73   Pulse 61   Ht 5\' 6"  (1.676 m)   Wt 124 lb 9.6 oz (56.5 kg)   SpO2 100%   BMI 20.11 kg/m  She is a pleasant elderly white female in no acute distress. HEENT exam is unremarkable.  Neck is supple with  JVD 10 cm, adenopathy, thyromegaly, or bruits. Lungs are clear. Cardiac exam reveals a grade 1-2/6 systolic murmur at apex. Pulse is irregular. There is no S3. Abdomen is soft and nontender without masses. She has 1+ edema R>L. She does have diffuse superficial varicosities in her lower  Extremities and some macular lesions.   Pedal pulses are palpable. Neurologic exam is nonfocal.  LABORATORY DATA: Lab Results  Component Value Date   WBC 4.4 02/06/2014   HGB 12.9 02/06/2014   HCT 38.7 02/06/2014   PLT 125.0 (  L) 02/06/2014   GLUCOSE 112 (H) 02/06/2014   NA 132 (L) 02/06/2014   K 3.6 02/06/2014   CL 96 02/06/2014   CREATININE 0.5 02/06/2014   BUN 7 02/06/2014   CO2 32 02/06/2014   TSH 0.00 Repeated and verified X2. (L) 02/06/2014   INR 3.6 06/05/2016   Labs dated 01/28/16: Normal BMET and TSH.  Echo: 05/20/16: Study Conclusions  - Left ventricle: Systolic function was normal. The estimated   ejection fraction was in the range of 55% to 60%. Wall motion was   normal; there were no regional wall motion abnormalities. - Mitral valve: There was mild to moderate regurgitation directed   centrally. - Left atrium: The atrium was massively dilated. - Right atrium: The atrium was mildly dilated. - Atrial  septum: The septum bowed from left to right, consistent   with increased left atrial pressure. No defect or patent foramen   ovale was identified. - Pericardium, extracardiac: A small pericardial effusion was   identified posterior to the heart. There was no evidence of   hemodynamic compromise.   Assessment / Plan: 1. Chronic diastolic congestive heart failure -recent echocardiogram showed good LV function and only mild to moderate MR.  Edema has clearly improved and weight is down 2 lbs.  Will continue higher lasix dose. Since EF is normal she is not a candidate for entresto. Will stop this and resume Quinapril 10 mg daily.  Will check BMET and BNP today. Follow up in 4 months.  2. Atrial fibrillation. Rate is well controlled now. We had previously reduced her carvedilol dose because of bradycardia and this has improved. Continue anticoagulation with Coumadin. Check INR today.  3. Hypertension, controlled.

## 2016-06-05 NOTE — Patient Instructions (Signed)
Stop Entresto  Resume Quinapril 10 mg daily  We will check your INR today and check your potassium

## 2016-06-06 LAB — BRAIN NATRIURETIC PEPTIDE: Brain Natriuretic Peptide: 116 pg/mL — ABNORMAL HIGH (ref <100)

## 2016-06-08 ENCOUNTER — Other Ambulatory Visit: Payer: Self-pay | Admitting: Cardiology

## 2016-06-08 MED ORDER — WARFARIN SODIUM 5 MG PO TABS: 5.0000 mg | ORAL_TABLET | ORAL | 1 refills | Status: DC

## 2016-06-08 MED ORDER — FUROSEMIDE 20 MG PO TABS: ORAL_TABLET | ORAL | 3 refills | Status: DC

## 2016-06-08 NOTE — Telephone Encounter (Signed)
°*  STAT* If patient is at the pharmacy, call can be transferred to refill team.   1. Which medications need to be refilled? (please list name of each medication and dose if known) Furosemide and Warfarin  2. Which pharmacy/location (including street and city if local pharmacy) is medication to be sent to?[facility]-[phone]  3. Do they need a 30 day or 90 day supply?Furosemide-270 and refills-Warfarin-100 and refills

## 2016-06-08 NOTE — Telephone Encounter (Signed)
Rx(s) sent to pharmacy electronically.  

## 2016-06-19 ENCOUNTER — Other Ambulatory Visit: Payer: Self-pay | Admitting: *Deleted

## 2016-06-19 ENCOUNTER — Ambulatory Visit (INDEPENDENT_AMBULATORY_CARE_PROVIDER_SITE_OTHER): Payer: Medicare Other | Admitting: *Deleted

## 2016-06-19 DIAGNOSIS — I4891 Unspecified atrial fibrillation: Secondary | ICD-10-CM

## 2016-06-19 DIAGNOSIS — Z5181 Encounter for therapeutic drug level monitoring: Secondary | ICD-10-CM | POA: Diagnosis not present

## 2016-06-19 LAB — POCT INR: INR: 2.3

## 2016-06-19 MED ORDER — CARVEDILOL 25 MG PO TABS: ORAL_TABLET | ORAL | 0 refills | Status: DC

## 2016-06-27 ENCOUNTER — Emergency Department (HOSPITAL_COMMUNITY): Payer: Medicare Other

## 2016-06-27 ENCOUNTER — Observation Stay (HOSPITAL_COMMUNITY): Payer: Medicare Other

## 2016-06-27 ENCOUNTER — Encounter (HOSPITAL_COMMUNITY): Payer: Self-pay | Admitting: Emergency Medicine

## 2016-06-27 ENCOUNTER — Inpatient Hospital Stay (HOSPITAL_COMMUNITY)
Admission: EM | Admit: 2016-06-27 | Discharge: 2016-07-03 | DRG: 871 | Disposition: A | Discharge status: Skilled Nursing Facility | Payer: Medicare Other | Attending: Internal Medicine | Admitting: Internal Medicine

## 2016-06-27 DIAGNOSIS — I110 Hypertensive heart disease with heart failure: Secondary | ICD-10-CM | POA: Diagnosis present

## 2016-06-27 DIAGNOSIS — I959 Hypotension, unspecified: Secondary | ICD-10-CM | POA: Diagnosis present

## 2016-06-27 DIAGNOSIS — R748 Abnormal levels of other serum enzymes: Secondary | ICD-10-CM | POA: Diagnosis present

## 2016-06-27 DIAGNOSIS — R197 Diarrhea, unspecified: Secondary | ICD-10-CM | POA: Diagnosis present

## 2016-06-27 DIAGNOSIS — R040 Epistaxis: Secondary | ICD-10-CM | POA: Diagnosis present

## 2016-06-27 DIAGNOSIS — D6959 Other secondary thrombocytopenia: Secondary | ICD-10-CM | POA: Diagnosis present

## 2016-06-27 DIAGNOSIS — I482 Chronic atrial fibrillation: Secondary | ICD-10-CM | POA: Diagnosis present

## 2016-06-27 DIAGNOSIS — I4891 Unspecified atrial fibrillation: Secondary | ICD-10-CM | POA: Diagnosis present

## 2016-06-27 DIAGNOSIS — A4151 Sepsis due to Escherichia coli [E. coli]: Principal | ICD-10-CM | POA: Diagnosis present

## 2016-06-27 DIAGNOSIS — R062 Wheezing: Secondary | ICD-10-CM

## 2016-06-27 DIAGNOSIS — R778 Other specified abnormalities of plasma proteins: Secondary | ICD-10-CM | POA: Diagnosis present

## 2016-06-27 DIAGNOSIS — E039 Hypothyroidism, unspecified: Secondary | ICD-10-CM

## 2016-06-27 DIAGNOSIS — N390 Urinary tract infection, site not specified: Secondary | ICD-10-CM | POA: Diagnosis present

## 2016-06-27 DIAGNOSIS — R7989 Other specified abnormal findings of blood chemistry: Secondary | ICD-10-CM

## 2016-06-27 DIAGNOSIS — W19XXXA Unspecified fall, initial encounter: Secondary | ICD-10-CM

## 2016-06-27 DIAGNOSIS — E860 Dehydration: Secondary | ICD-10-CM | POA: Diagnosis present

## 2016-06-27 DIAGNOSIS — Z7901 Long term (current) use of anticoagulants: Secondary | ICD-10-CM

## 2016-06-27 DIAGNOSIS — J9601 Acute respiratory failure with hypoxia: Secondary | ICD-10-CM | POA: Diagnosis present

## 2016-06-27 DIAGNOSIS — R0600 Dyspnea, unspecified: Secondary | ICD-10-CM

## 2016-06-27 DIAGNOSIS — Z6821 Body mass index (BMI) 21.0-21.9, adult: Secondary | ICD-10-CM

## 2016-06-27 DIAGNOSIS — E43 Unspecified severe protein-calorie malnutrition: Secondary | ICD-10-CM | POA: Diagnosis present

## 2016-06-27 DIAGNOSIS — J810 Acute pulmonary edema: Secondary | ICD-10-CM | POA: Diagnosis present

## 2016-06-27 DIAGNOSIS — R6521 Severe sepsis with septic shock: Secondary | ICD-10-CM | POA: Diagnosis present

## 2016-06-27 DIAGNOSIS — I248 Other forms of acute ischemic heart disease: Secondary | ICD-10-CM | POA: Diagnosis present

## 2016-06-27 DIAGNOSIS — N179 Acute kidney failure, unspecified: Secondary | ICD-10-CM | POA: Diagnosis present

## 2016-06-27 DIAGNOSIS — M6282 Rhabdomyolysis: Secondary | ICD-10-CM | POA: Diagnosis present

## 2016-06-27 DIAGNOSIS — E871 Hypo-osmolality and hyponatremia: Secondary | ICD-10-CM | POA: Diagnosis present

## 2016-06-27 DIAGNOSIS — K5730 Diverticulosis of large intestine without perforation or abscess without bleeding: Secondary | ICD-10-CM | POA: Diagnosis present

## 2016-06-27 DIAGNOSIS — I5043 Acute on chronic combined systolic (congestive) and diastolic (congestive) heart failure: Secondary | ICD-10-CM | POA: Diagnosis present

## 2016-06-27 DIAGNOSIS — A419 Sepsis, unspecified organism: Secondary | ICD-10-CM | POA: Diagnosis present

## 2016-06-27 DIAGNOSIS — E861 Hypovolemia: Secondary | ICD-10-CM | POA: Diagnosis present

## 2016-06-27 DIAGNOSIS — I420 Dilated cardiomyopathy: Secondary | ICD-10-CM | POA: Diagnosis present

## 2016-06-27 DIAGNOSIS — Z66 Do not resuscitate: Secondary | ICD-10-CM | POA: Diagnosis present

## 2016-06-27 LAB — BASIC METABOLIC PANEL
ANION GAP: 11 (ref 5–15)
Anion gap: 12 (ref 5–15)
Anion gap: 10 (ref 5–15)
BUN: 33 mg/dL — AB (ref 6–20)
BUN: 31 mg/dL — ABNORMAL HIGH (ref 6–20)
BUN: 29 mg/dL — ABNORMAL HIGH (ref 6–20)
CALCIUM: 9.1 mg/dL (ref 8.9–10.3)
CHLORIDE: 98 mmol/L — AB (ref 101–111)
CO2: 23 mmol/L (ref 22–32)
CO2: 20 mmol/L — AB (ref 22–32)
CO2: 21 mmol/L — ABNORMAL LOW (ref 22–32)
CREATININE: 1.28 mg/dL — AB (ref 0.44–1.00)
Calcium: 7.9 mg/dL — ABNORMAL LOW (ref 8.9–10.3)
Calcium: 8.3 mg/dL — ABNORMAL LOW (ref 8.9–10.3)
Chloride: 91 mmol/L — ABNORMAL LOW (ref 101–111)
Chloride: 99 mmol/L — ABNORMAL LOW (ref 101–111)
Creatinine, Ser: 1.06 mg/dL — ABNORMAL HIGH (ref 0.44–1.00)
Creatinine, Ser: 1.02 mg/dL — ABNORMAL HIGH (ref 0.44–1.00)
GFR calc Af Amer: 42 mL/min — ABNORMAL LOW (ref >60)
GFR calc Af Amer: 53 mL/min — ABNORMAL LOW (ref >60)
GFR calc Af Amer: 56 mL/min — ABNORMAL LOW (ref >60)
GFR calc non Af Amer: 36 mL/min — ABNORMAL LOW (ref >60)
GFR, EST NON AFRICAN AMERICAN: 46 mL/min — AB (ref >60)
GFR, EST NON AFRICAN AMERICAN: 48 mL/min — AB (ref >60)
GLUCOSE: 105 mg/dL — AB (ref 65–99)
GLUCOSE: 126 mg/dL — AB (ref 65–99)
Glucose, Bld: 119 mg/dL — ABNORMAL HIGH (ref 65–99)
POTASSIUM: 3.4 mmol/L — AB (ref 3.5–5.1)
POTASSIUM: 3.8 mmol/L (ref 3.5–5.1)
Potassium: 3.5 mmol/L (ref 3.5–5.1)
SODIUM: 126 mmol/L — AB (ref 135–145)
Sodium: 128 mmol/L — ABNORMAL LOW (ref 135–145)
Sodium: 131 mmol/L — ABNORMAL LOW (ref 135–145)

## 2016-06-27 LAB — URINALYSIS, ROUTINE W REFLEX MICROSCOPIC
Bilirubin Urine: NEGATIVE
Glucose, UA: NEGATIVE mg/dL
Ketones, ur: NEGATIVE mg/dL
NITRITE: NEGATIVE
Protein, ur: 100 mg/dL — AB
SPECIFIC GRAVITY, URINE: 1.013 (ref 1.005–1.030)
pH: 5 (ref 5.0–8.0)

## 2016-06-27 LAB — MRSA PCR SCREENING: MRSA BY PCR: NEGATIVE

## 2016-06-27 LAB — TSH: TSH: 1.168 u[IU]/mL (ref 0.350–4.500)

## 2016-06-27 LAB — HEPATIC FUNCTION PANEL
ALT: 34 U/L (ref 14–54)
AST: 52 U/L — AB (ref 15–41)
Albumin: 3 g/dL — ABNORMAL LOW (ref 3.5–5.0)
Alkaline Phosphatase: 50 U/L (ref 38–126)
BILIRUBIN DIRECT: 0.9 mg/dL — AB (ref 0.1–0.5)
BILIRUBIN INDIRECT: 2 mg/dL — AB (ref 0.3–0.9)
Total Bilirubin: 2.9 mg/dL — ABNORMAL HIGH (ref 0.3–1.2)
Total Protein: 5.2 g/dL — ABNORMAL LOW (ref 6.5–8.1)

## 2016-06-27 LAB — PROTIME-INR
INR: 3.38
Prothrombin Time: 35 seconds — ABNORMAL HIGH (ref 11.4–15.2)

## 2016-06-27 LAB — CBC
HCT: 32.9 % — ABNORMAL LOW (ref 36.0–46.0)
Hemoglobin: 11.1 g/dL — ABNORMAL LOW (ref 12.0–15.0)
MCH: 30.4 pg (ref 26.0–34.0)
MCHC: 33.7 g/dL (ref 30.0–36.0)
MCV: 90.1 fL (ref 78.0–100.0)
PLATELETS: 122 10*3/uL — AB (ref 150–400)
RBC: 3.65 MIL/uL — ABNORMAL LOW (ref 3.87–5.11)
RDW: 17.7 % — AB (ref 11.5–15.5)
WBC: 8.9 10*3/uL (ref 4.0–10.5)

## 2016-06-27 LAB — I-STAT TROPONIN, ED: Troponin i, poc: 0.06 ng/mL (ref 0.00–0.08)

## 2016-06-27 LAB — I-STAT CG4 LACTIC ACID, ED
LACTIC ACID, VENOUS: 2.52 mmol/L — AB (ref 0.5–1.9)
Lactic Acid, Venous: 1.07 mmol/L (ref 0.5–1.9)

## 2016-06-27 LAB — PROCALCITONIN: Procalcitonin: 6.63 ng/mL

## 2016-06-27 LAB — TROPONIN I
TROPONIN I: 0.03 ng/mL — AB (ref <0.03)
Troponin I: 0.03 ng/mL (ref <0.03)

## 2016-06-27 LAB — CK: CK TOTAL: 506 U/L — AB (ref 38–234)

## 2016-06-27 LAB — CBG MONITORING, ED: GLUCOSE-CAPILLARY: 121 mg/dL — AB (ref 65–99)

## 2016-06-27 LAB — MAGNESIUM: Magnesium: 1.7 mg/dL (ref 1.7–2.4)

## 2016-06-27 MED ORDER — SODIUM CHLORIDE 0.9 % IV BOLUS (SEPSIS)
1500.0000 mL | Freq: Once | INTRAVENOUS | Status: AC
  Administered 2016-06-27: 1500 mL via INTRAVENOUS

## 2016-06-27 MED ORDER — POLYETHYLENE GLYCOL 3350 17 G PO PACK: 17.0000 g | PACK | Freq: Every day | ORAL | Status: DC | PRN

## 2016-06-27 MED ORDER — SODIUM CHLORIDE 0.9 % IV SOLN
Freq: Once | INTRAVENOUS | Status: AC
  Administered 2016-06-27: 18:00:00 via INTRAVENOUS

## 2016-06-27 MED ORDER — ONDANSETRON HCL 4 MG/2ML IJ SOLN: 4.0000 mg | Freq: Four times a day (QID) | INTRAMUSCULAR | Status: DC | PRN

## 2016-06-27 MED ORDER — SODIUM CHLORIDE 0.9 % IV BOLUS (SEPSIS)
1000.0000 mL | Freq: Once | INTRAVENOUS | Status: AC
  Administered 2016-06-27: 1000 mL via INTRAVENOUS

## 2016-06-27 MED ORDER — MAGNESIUM SULFATE 2 GM/50ML IV SOLN
2.0000 g | Freq: Once | INTRAVENOUS | Status: AC
  Administered 2016-06-27: 2 g via INTRAVENOUS
  Filled 2016-06-27: qty 50

## 2016-06-27 MED ORDER — WARFARIN - PHARMACIST DOSING INPATIENT
Freq: Every day | Status: DC
  Administered 2016-07-01: 1

## 2016-06-27 MED ORDER — TRAZODONE HCL 50 MG PO TABS
25.0000 mg | ORAL_TABLET | Freq: Every evening | ORAL | Status: DC | PRN
  Administered 2016-07-01 – 2016-07-02 (×2): 25 mg via ORAL
  Filled 2016-06-27 (×2): qty 1

## 2016-06-27 MED ORDER — SODIUM CHLORIDE 0.9% FLUSH
3.0000 mL | Freq: Two times a day (BID) | INTRAVENOUS | Status: DC
  Administered 2016-06-27 – 2016-07-03 (×10): 3 mL via INTRAVENOUS

## 2016-06-27 MED ORDER — ACETAMINOPHEN 650 MG RE SUPP: 650.0000 mg | Freq: Four times a day (QID) | RECTAL | Status: DC | PRN

## 2016-06-27 MED ORDER — CARVEDILOL 25 MG PO TABS
25.0000 mg | ORAL_TABLET | Freq: Two times a day (BID) | ORAL | Status: DC
  Administered 2016-06-29 – 2016-07-03 (×7): 25 mg via ORAL
  Filled 2016-06-27 (×9): qty 1

## 2016-06-27 MED ORDER — LEVOTHYROXINE SODIUM 112 MCG PO TABS
112.0000 ug | ORAL_TABLET | Freq: Every day | ORAL | Status: DC
  Administered 2016-06-28 – 2016-06-29 (×2): 112 ug via ORAL
  Filled 2016-06-27 (×11): qty 1

## 2016-06-27 MED ORDER — SODIUM CHLORIDE 0.9 % IV SOLN
Freq: Once | INTRAVENOUS | Status: AC
  Administered 2016-06-27: 16:00:00 via INTRAVENOUS

## 2016-06-27 MED ORDER — ACETAMINOPHEN 325 MG PO TABS
650.0000 mg | ORAL_TABLET | Freq: Four times a day (QID) | ORAL | Status: DC | PRN
  Administered 2016-06-27 – 2016-07-02 (×5): 650 mg via ORAL
  Filled 2016-06-27 (×5): qty 2

## 2016-06-27 MED ORDER — ONDANSETRON HCL 4 MG PO TABS: 4.0000 mg | ORAL_TABLET | Freq: Four times a day (QID) | ORAL | Status: DC | PRN

## 2016-06-27 NOTE — ED Provider Notes (Signed)
Not in room   Margarita Grizzle, MD 06/27/16 1435

## 2016-06-27 NOTE — ED Provider Notes (Signed)
[facility]-EMERGENCY DEPT Provider Note   CSN: [id number] Arrival date & time: 06/27/16  1356     History   Chief Complaint Chief Complaint  Patient presents with  . Fall  . Weakness  . Diarrhea    HPI Gabriela Hernandez is a 83 y.o. female.  The history is provided by the patient and a relative.  Diarrhea   This is a new problem. The current episode started 2 days ago. The problem occurs continuously. The problem has been gradually worsening. There has been no fever. Pertinent negatives include no vomiting. She has tried nothing for the symptoms. Risk factors: antihypertensive medications.    Past Medical History:  Diagnosis Date  . Atrial fibrillation (HCC)   . Chronic systolic CHF (congestive heart failure) (HCC) 04/05/2012  . Dilated cardiomyopathy (HCC)    with ejection fraction of 30-35%  . Dysphagia   . Esophageal stricture   . History of shingles   . Hypertension   . Hypothyroidism   . Lightheadedness     Patient Active Problem List   Diagnosis Date Noted  . Encounter for therapeutic drug monitoring 06/14/2013  . Dyspnea 02/16/2013  . Fatigue 10/03/2012  . Chronic systolic CHF (congestive heart failure) (HCC) 04/05/2012  . Dilated cardiomyopathy (HCC)   . Hypertension   . Atrial fibrillation (HCC) 08/19/2010    Past Surgical History:  Procedure Laterality Date  . APPENDECTOMY    . CATARACT EXTRACTION    . CESAREAN SECTION    . FOOT SURGERY    . THYROIDECTOMY    . TONSILLECTOMY    . VEIN LIGATION      OB History    No data available       Home Medications    Prior to Admission medications   Medication Sig Start Date End Date Taking? Authorizing Provider  carvedilol (COREG) 25 MG tablet TAKE 1/2 TABLET BY MOUTH TWICE DAILY WITH A MEAL 06/19/16   Peter M [place], MD  furosemide (LASIX) 20 MG tablet Take 2 tablets (40 mg total) by mouth every morning and take 1 tablet (20 mg total) by mouth every evening. 06/08/16   Peter M [place], MD  levothyroxine  (SYNTHROID, LEVOTHROID) 112 MCG tablet Take 112 mcg by mouth daily before breakfast.    Historical Provider, MD  Multiple Vitamins-Minerals (MULTIVITAMIN WITH MINERALS) tablet Take 1 tablet by mouth daily.    Historical Provider, MD  Multiple Vitamins-Minerals (PRESERVISION AREDS 2) CAPS Take by mouth. Take 1 soft gel tablet daily    Historical Provider, MD  quinapril (ACCUPRIL) 20 MG tablet Take 0.5 tablets (10 mg total) by mouth daily. 06/05/16   Peter M [place], MD  TRETINOIN EX Apply 1 application topically daily as needed.    Historical Provider, MD  triamcinolone cream (KENALOG) 0.1 % Apply 1 application topically as directed.    Historical Provider, MD  warfarin (COUMADIN) 5 MG tablet Take 1 tablet (5 mg total) by mouth as directed. 06/08/16   Peter M [place], MD    Family History Family History  Problem Relation Age of Onset  . Hypertension Mother   . Heart attack Father     Social History Social History  Substance Use Topics  . Smoking status: Never Smoker  . Smokeless tobacco: Never Used  . Alcohol use No     Allergies   Patient has no known allergies.   Review of Systems Review of Systems  Constitutional: Positive for appetite change and fatigue.  Gastrointestinal: Positive for diarrhea. Negative for  vomiting.  All other systems reviewed and are negative.    Physical Exam Updated Vital Signs BP (!) 77/49 (BP Location: Right Arm)   Pulse 68   Temp 97.8 F (36.6 C) (Oral)   Resp 15   SpO2 94%   Physical Exam  Constitutional: She is oriented to person, place, and time. She appears well-developed and well-nourished. She appears ill. No distress.  HENT:  Head: Normocephalic.  Nose: Nose normal.  Eyes: Conjunctivae are normal.  Neck: Neck supple. No tracheal deviation present.  Cardiovascular: Normal rate, regular rhythm and normal heart sounds.   Pulmonary/Chest: Effort normal and breath sounds normal. No respiratory distress.  Abdominal: Soft. She exhibits no  distension. There is no tenderness. There is no rebound and no guarding.  Neurological: She is alert and oriented to person, place, and time.  Skin: Skin is warm and dry.  Psychiatric: She has a normal mood and affect.     ED Treatments / Results  Labs (all labs ordered are listed, but only abnormal results are displayed) Labs Reviewed  BASIC METABOLIC PANEL - Abnormal; Notable for the following:       Result Value   Sodium 126 (*)    Chloride 91 (*)    Glucose, Bld 119 (*)    BUN 33 (*)    Creatinine, Ser 1.28 (*)    GFR calc non Af Amer 36 (*)    GFR calc Af Amer 42 (*)    All other components within normal limits  CBC - Abnormal; Notable for the following:    RBC 3.65 (*)    Hemoglobin 11.1 (*)    HCT 32.9 (*)    RDW 17.7 (*)    Platelets 122 (*)    All other components within normal limits  URINALYSIS, ROUTINE W REFLEX MICROSCOPIC - Abnormal; Notable for the following:    Color, Urine AMBER (*)    APPearance CLOUDY (*)    Hgb urine dipstick MODERATE (*)    Protein, ur 100 (*)    Leukocytes, UA LARGE (*)    Bacteria, UA RARE (*)    Squamous Epithelial / LPF 0-5 (*)    All other components within normal limits  HEPATIC FUNCTION PANEL - Abnormal; Notable for the following:    Total Protein 5.2 (*)    Albumin 3.0 (*)    AST 52 (*)    Total Bilirubin 2.9 (*)    Bilirubin, Direct 0.9 (*)    Indirect Bilirubin 2.0 (*)    All other components within normal limits  CK - Abnormal; Notable for the following:    Total CK 506 (*)    All other components within normal limits  PROTIME-INR - Abnormal; Notable for the following:    Prothrombin Time 35.0 (*)    All other components within normal limits  TROPONIN I - Abnormal; Notable for the following:    Troponin I 0.03 (*)    All other components within normal limits  TROPONIN I - Abnormal; Notable for the following:    Troponin I 0.03 (*)    All other components within normal limits  BASIC METABOLIC PANEL - Abnormal;  Notable for the following:    Sodium 128 (*)    Potassium 3.4 (*)    Chloride 98 (*)    CO2 20 (*)    Glucose, Bld 105 (*)    BUN 31 (*)    Creatinine, Ser 1.06 (*)    Calcium 7.9 (*)    GFR  calc non Af Amer 46 (*)    GFR calc Af Amer 53 (*)    All other components within normal limits  BASIC METABOLIC PANEL - Abnormal; Notable for the following:    Sodium 131 (*)    Chloride 99 (*)    CO2 21 (*)    Glucose, Bld 126 (*)    BUN 29 (*)    Creatinine, Ser 1.02 (*)    Calcium 8.3 (*)    GFR calc non Af Amer 48 (*)    GFR calc Af Amer 56 (*)    All other components within normal limits  CBG MONITORING, ED - Abnormal; Notable for the following:    Glucose-Capillary 121 (*)    All other components within normal limits  I-STAT CG4 LACTIC ACID, ED - Abnormal; Notable for the following:    Lactic Acid, Venous 2.52 (*)    All other components within normal limits  MRSA PCR SCREENING  CULTURE, BLOOD (ROUTINE X 2)  CULTURE, BLOOD (ROUTINE X 2)  GASTROINTESTINAL PANEL BY PCR, STOOL (REPLACES STOOL CULTURE)  C DIFFICILE QUICK SCREEN W PCR REFLEX  TSH  MAGNESIUM  PROCALCITONIN  TROPONIN I  CBC  PROTIME-INR  MAGNESIUM  CK  COMPREHENSIVE METABOLIC PANEL  BASIC METABOLIC PANEL  BASIC METABOLIC PANEL  I-STAT TROPOININ, ED  I-STAT CG4 LACTIC ACID, ED    EKG  EKG Interpretation  Date/Time:  Saturday June 27 2016 14:21:01 EST Ventricular Rate:  65 PR Interval:    QRS Duration: 104 QT Interval:  428 QTC Calculation: 445 R Axis:   -125 Text Interpretation:  Atrial fibrillation Right ventricular hypertrophy Anterolateral infarct , age undetermined Abnormal ECG No significant change since last tracing Confirmed by Taleia Sadowski MD, Reuel Boom [id number]) on 06/27/2016 3:45:32 PM       Radiology Ct Abdomen Pelvis Wo Contrast  Result Date: 06/27/2016 CLINICAL DATA:  Hyperbilirubinemia EXAM: CT ABDOMEN AND PELVIS WITHOUT CONTRAST TECHNIQUE: Multidetector CT imaging of the abdomen and pelvis  was performed following the standard protocol without IV contrast. COMPARISON:  Ultrasound from earlier in the same day FINDINGS: Lower chest: Mild atelectatic changes are noted in the left lung base. Pectus excavatum is seen. The cardiac shadow is enlarged and somewhat distorted by the pectus deformity. Hepatobiliary: The liver shows a small hypodensity consistent with cysts. This was not well appreciated on the recent ultrasound. The gallbladder is well distended with cholelithiasis. No significant pericholecystic fluid is seen. Pancreas: Unremarkable. No pancreatic ductal dilatation or surrounding inflammatory changes. Spleen: Normal in size without focal abnormality. Adrenals/Urinary Tract: The adrenal glands are within normal limits. The right kidney demonstrates a prominent extrarenal pelvis although no obstructive changes are seen. The left kidney is partially pelvic in location. No obstructive changes or calculi are seen. Stomach/Bowel: Diverticulosis of the colon is noted without evidence of diverticulitis. The appendix is not well visualized consistent with the surgical history. Vascular/Lymphatic: Aortic atherosclerosis. No enlarged abdominal or pelvic lymph nodes. Reproductive: Uterus and bilateral adnexa are unremarkable. Other: No abdominal wall hernia or abnormality. No abdominopelvic ascites. Musculoskeletal: Degenerative changes of lumbar spine are seen. IMPRESSION: Cholelithiasis without complicating factors. No biliary dilatation is seen. Hepatic cyst. No acute abnormality noted. Electronically Signed   By: Alcide Clever M.D.   On: 06/27/2016 18:32   Dg Chest 2 View  Result Date: 06/27/2016 CLINICAL DATA:  Hypotension. EXAM: CHEST  2 VIEW COMPARISON:  None 07/07/2013 FINDINGS: Marked cardiomegaly. No confluent airspace opacities, effusions or edema. No acute bony abnormality. IMPRESSION: Marked  cardiomegaly.  No active disease. Electronically Signed   By: Charlett Nose M.D.   On: 06/27/2016  16:17   Ct Head Wo Contrast  Result Date: 06/27/2016 CLINICAL DATA:  Fall, posterior head injury EXAM: CT HEAD WITHOUT CONTRAST TECHNIQUE: Contiguous axial images were obtained from the base of the skull through the vertex without intravenous contrast. COMPARISON:  03/08/2015 FINDINGS: Brain: No evidence of acute infarction, hemorrhage, hydrocephalus, extra-axial collection or mass lesion/mass effect. Global cortical atrophy. Subcortical white matter and periventricular small vessel ischemic changes. Vascular: No hyperdense vessel or unexpected calcification. Skull: Normal. Negative for fracture or focal lesion. Sinuses/Orbits: Partial opacification of the left frontal sinus. Visualized paranasal sinuses the mastoid air cells are otherwise clear. Other: None. IMPRESSION: No evidence of acute intracranial abnormality. Atrophy with small vessel ischemic changes. Electronically Signed   By: Charline Bills M.D.   On: 06/27/2016 16:47   US Abdomen Limited Ruq  Result Date: 06/27/2016 CLINICAL DATA:  Elevated bilirubin EXAM: US ABDOMEN LIMITED - RIGHT UPPER QUADRANT COMPARISON:  None. FINDINGS: Gallbladder: Gallbladder is well distended with multiple stones. Mild gallbladder wall thickening is noted. No pericholecystic fluid is seen. Common bile duct: Diameter: 3 mm. Liver: Mildly heterogeneous although no focal lesion is seen. No definitive biliary obstruction is noted. IMPRESSION: Cholelithiasis with mild wall thickening. No other focal abnormality is seen. Electronically Signed   By: Alcide Clever M.D.   On: 06/27/2016 18:19    Procedures Procedures (including critical care time)  EMERGENCY DEPARTMENT [place] FAST EXAM "Limited Ultrasound of the Abdomen and Pericardium" (FAST Exam).  INDICATIONS:Abnornal vitals Multiple views of the abdomen and pericardium are obtained with a multi-frequency probe.  PERFORMED BY: Myself IMAGES ARCHIVED?: Yes LIMITATIONS:  None INTERPRETATION:  No abdominal free  fluid and Pericardial effusion present  EMERGENCY DEPARTMENT ULTRASOUND  Study: Limited Retroperitoneal Ultrasound of the Abdominal Aorta.  INDICATIONS:Abnormal vital signs Multiple views of the abdominal aorta were obtained in real-time from the diaphragmatic hiatus to the aortic bifurcation in transverse planes with a multi-frequency probe.  PERFORMED BY: Myself IMAGES ARCHIVED?: Yes LIMITATIONS:  None INTERPRETATION:  No abdominal aortic aneurysm    EMERGENCY DEPARTMENT [place] CARDIAC EXAM "Study: Limited Ultrasound of the Heart and Pericardium"  INDICATIONS:Abnormal vital signs Multiple views of the heart and pericardium were obtained in real-time with a multi-frequency probe.  PERFORMED ZO:XWRUEA IMAGES ARCHIVED?: Yes LIMITATIONS:  None VIEWS USED: Subcostal 4 chamber, Parasternal long axis, Parasternal short axis, Apical 4 chamber  and Inferior Vena Cava INTERPRETATION: Pericardial effusion present, Amount of pericardial effusion small, Cardiac tamponade absent, Probable elevated CVP, Normal contractility, Increased contractility and IVC dilated Severe biatral dilation noted  CRITICAL CARE Performed by: Lyndal Pulley Total critical care time: 30 minutes Critical care time was exclusive of separately billable procedures and treating other patients. Critical care was necessary to treat or prevent imminent or life-threatening deterioration. Critical care was time spent personally by me on the following activities: development of treatment plan with patient and/or surrogate as well as nursing, discussions with consultants, evaluation of patient's response to treatment, examination of patient, obtaining history from patient or surrogate, ordering and performing treatments and interventions, ordering and review of laboratory studies, ordering and review of radiographic studies, pulse oximetry and re-evaluation of patient's condition.  Medications Ordered in ED Medications  sodium  chloride 0.9 % bolus 1,000 mL (1,000 mLs Intravenous New Bag/Given 06/27/16 1540)     Initial Impression / Assessment and Plan / ED Course  I have reviewed the triage vital  signs and the nursing notes.  Pertinent labs & imaging results that were available during my care of the patient were reviewed by me and considered in my medical decision making (see chart for details).     83 y.o. female presents with profuse diarrheal illness over last 2 days. Appears volume depleted despite leg swelling and evidence of dilated atria and diastolic dysfunction on bedside echo. Hypotensive on arrival with slowing and appears generally ill. No fever, no cough but is having some dyspnea. Patient is on beta blocker and ACE inhibitor for hypertension and Lasix for heart failure which I suspect is contributing to poor compensation for total body volume loss and hypotension. Other considerations are hypothyroid or infection so TSH screening and further lab workup performed as well. Lactate elevated but no WBC elevation or other SIRS to suggest developing sepsis and this is likely 2/2 volume depletion.  Patient responding well to initial conservative fluid resuscitation. We will recommend admission for monitoring of blood pressure, metabolizing medications and further fluid balance stabilization. Hospitalist was consulted for admission and will see the patient in the emergency department.   Final Clinical Impressions(s) / ED Diagnoses   Final diagnoses:  Hypotension, unspecified hypotension type  Acute hyponatremia  Diarrhea, unspecified type  AKI (acute kidney injury) (HCC)  Hyperbilirubinemia    New Prescriptions New Prescriptions   No medications on file     Lyndal Pulley, MD 06/28/16 0109

## 2016-06-27 NOTE — H&P (Signed)
History and Physical    Gabriela Hernandez BJY:[id number] DOB: November 13, 1926 DOA: 06/27/2016  PCP: Lupe Carney, MD   Patient coming from: home  Chief Complaint: diarrhea, weakness and fall  HPI: Gabriela Hernandez is a 83 y.o. female with medical history significant of hypertension, atrial fibrillation, congestive heart failure with dilated nonischemic cardiomyopathy with historic diagnosis of CHF, but last Echo on 05/20/16 showed normal EF 55-60%, esophageal stricture, hypothyroidism who presented to the emergency room with complaints of generalized weakness and diarrhea during the last 2-3 days. Patient complains of being unstable on his feet and she felt yesterday after she lost her balance. She landed on her back and head, but denied loss of consciousness. Also complains of generalized body ache   ED Course: On presentation to the emergency department she was hypotensive with blood pressure of 70/50 mmHg, was given IV fluids and blood pressure improved to 95/58% mercury. Patient was afebrile, blood work showed hyponatremia with sodium 126, mildly elevated creatinine 1.28 and BUN 33 and mildly reduced platelets of 126,000 and Hgb 11.1. Lactic acid was slightly elevated at 2.52 Magnesium was borderline low-1.7, troponin was normal but CPK elevated at 506 and might be associated with fall Chest x-ray revealed marked cardiomegaly, no airspace opacities, no pleural effusions. EKG revealed atrial fibrillation and old anterolateral Q waves present on previous EKGs. Head CT was negative for acute abnormalities  Review of Systems: As per HPI otherwise 10 point review of systems negative.   Ambulatory Status: walks with a walker  Past Medical History:  Diagnosis Date  . Atrial fibrillation (HCC)   . Chronic systolic CHF (congestive heart failure) (HCC) 04/05/2012  . Dilated cardiomyopathy (HCC)    with ejection fraction of 30-35%  . Dysphagia   . Esophageal stricture   . History of shingles   .  Hypertension   . Hypothyroidism   . Lightheadedness     Past Surgical History:  Procedure Laterality Date  . APPENDECTOMY    . CATARACT EXTRACTION    . CESAREAN SECTION    . FOOT SURGERY    . THYROIDECTOMY    . TONSILLECTOMY    . VEIN LIGATION      Social History   Social History  . Marital status: Married    Spouse name: N/A  . Number of children: 2  . Years of education: N/A   Occupational History  .  Retired   Social History Main Topics  . Smoking status: Never Smoker  . Smokeless tobacco: Never Used  . Alcohol use No  . Drug use: No  . Sexual activity: Not on file   Other Topics Concern  . Not on file   Social History Narrative  . No narrative on file    No Known Allergies  Family History  Problem Relation Age of Onset  . Hypertension Mother   . Heart attack Father     Prior to Admission medications   Medication Sig Start Date End Date Taking? Authorizing Provider  carvedilol (COREG) 25 MG tablet TAKE 1/2 TABLET BY MOUTH TWICE DAILY WITH A MEAL 06/19/16  Yes Peter M [place], MD  furosemide (LASIX) 20 MG tablet Take 2 tablets (40 mg total) by mouth every morning and take 1 tablet (20 mg total) by mouth every evening. 06/08/16  Yes Peter M [place], MD  levothyroxine (SYNTHROID, LEVOTHROID) 112 MCG tablet Take 112 mcg by mouth daily before breakfast.   Yes Historical Provider, MD  Multiple Vitamins-Minerals (MULTIVITAMIN WITH MINERALS) tablet Take  1 tablet by mouth daily.   Yes Historical Provider, MD  quinapril (ACCUPRIL) 20 MG tablet Take 0.5 tablets (10 mg total) by mouth daily. 06/05/16  Yes Peter M [place], MD  warfarin (COUMADIN) 5 MG tablet Take 1 tablet (5 mg total) by mouth as directed. Patient taking differently: Take 5 mg by mouth every morning.  06/08/16  Yes Peter M [place], MD    Physical Exam: Vitals:   06/27/16 1654 06/27/16 1655 06/27/16 1700 06/27/16 1745  BP: (!) 85/50  (!) 87/51 (!) 87/52  Pulse: 71 (!) 58 66 77  Resp:  17 16 23   Temp:       TempSrc:      SpO2: 90% 99% 97% 98%  Weight:      Height:         General: Appears undernourished and sick Eyes: PERRLA, EOMI, normal lids, iris ENT:  grossly normal hearing, lips & tongue, mucous membranes moist and intact Neck: no lymphoadenopathy, masses or thyromegaly Cardiovascular: RRR, no m/r/g. No JVD, carotid bruits. No LE edema.  Respiratory: bilateral no wheezes, rales, rhonchi or cracles. Normal respiratory effort. No accessory muscle use observed Abdomen: non-tender,slightly distended, no organomegaly or masses appreciated. BS present in all quadrants Skin: jaundiced, no rash, ulcers or induration seen on limited exam Musculoskeletal: grossly normal tone BUE/BLE, good ROM, no bony abnormality or joint deformities observed Psychiatric: grossly normal mood and affect, speech fluent and appropriate, alert and oriented x3 Neurologic: CN II-XII grossly intact, moves all extremities in coordinated fashion, sensation intact  Labs on Admission: I have personally reviewed following labs and imaging studies  CBC, BMP  GFR: Estimated Creatinine Clearance: 27.7 mL/min (by C-G formula based on SCr of 1.28 mg/dL (H)).   Creatinine Clearance: Estimated Creatinine Clearance: 27.7 mL/min (by C-G formula based on SCr of 1.28 mg/dL (H)).    Radiological Exams on Admission: Dg Chest 2 View  Result Date: 06/27/2016 CLINICAL DATA:  Hypotension. EXAM: CHEST  2 VIEW COMPARISON:  None 07/07/2013 FINDINGS: Marked cardiomegaly. No confluent airspace opacities, effusions or edema. No acute bony abnormality. IMPRESSION: Marked cardiomegaly.  No active disease. Electronically Signed   By: Charlett Nose M.D.   On: 06/27/2016 16:17   Ct Head Wo Contrast  Result Date: 06/27/2016 CLINICAL DATA:  Fall, posterior head injury EXAM: CT HEAD WITHOUT CONTRAST TECHNIQUE: Contiguous axial images were obtained from the base of the skull through the vertex without intravenous contrast. COMPARISON:   03/08/2015 FINDINGS: Brain: No evidence of acute infarction, hemorrhage, hydrocephalus, extra-axial collection or mass lesion/mass effect. Global cortical atrophy. Subcortical white matter and periventricular small vessel ischemic changes. Vascular: No hyperdense vessel or unexpected calcification. Skull: Normal. Negative for fracture or focal lesion. Sinuses/Orbits: Partial opacification of the left frontal sinus. Visualized paranasal sinuses the mastoid air cells are otherwise clear. Other: None. IMPRESSION: No evidence of acute intracranial abnormality. Atrophy with small vessel ischemic changes. Electronically Signed   By: Charline Bills M.D.   On: 06/27/2016 16:47    EKG: Independently reviewed -  atrial fibrillation and old anterolateral Q waves present on previous EKGs.  Assessment/Plan Principal Problem:   Atrial fibrillation (HCC) Active Problems:   Dehydration   Diarrhea   Hyponatremia   Fall   Hypothyroidism   Hyperbilirubinemia   Dehydration secondary to diarrhea and concomitant intake of diuretics and antihypertensive medication  Patient received a bolus of 1 L NS, continue IV hydration Continue gentle hydration, monitor fluid volume status, renal function and electrolytes - she has borderline  low Mg.  Recheck Mg tomorrow after an IV dose of 2 g  Hyponatremia - secondary to dehydration and intestinal electrolyte losses Continue NS and monitor Na q 6 hours  Diarrhea  Will check GI panel, continue IV hydration Lactic acid elevated at 2.52, but doubtful that patient has sepsisb - she is not febrile and has normal WBC count  Hyperbiliruminemia Patient will have abdominal CT w/o contrast today and US of the liver tomorrow  Fall   Head CT is negative for acute findings Patient has elevated CK at 506, probably associated with fall and generalized muscle weakness; troponin negative Will continue gentle hydration and follow CK trend  Atrial Fibrillation with CVR Last Echo  in January of this year showed EF 55-60%, but was not adquate to determine diastolic dysfunction d/t Afib  Hypothyroidism Continue Synthroid hyperbilirubinemia  Hyperbiliruminemia Patient will have abdominal CT w/o contrast today and US of the liver tomorrow  DVT prophylaxis: Warfarin  Code Status: full Family Communication: at bedside Disposition Plan: Telemetry Consults called: None Admission status: Observation   Vinetta Bergamo Pager: [phone] [facility]  If 7PM-7AM, please contact night-coverage [email or web address] Password [facility]  06/27/2016, 5:49 PM

## 2016-06-27 NOTE — Progress Notes (Addendum)
Text paged Dr. Emmit Pomfret with bp 75 and positive troponin 0.03.  Waiting call back.  Will continue to monitor.

## 2016-06-27 NOTE — ED Notes (Signed)
Nurse first and charge notified of pt low BP, pt to be next in line for room

## 2016-06-27 NOTE — ED Notes (Signed)
Report attempted 

## 2016-06-27 NOTE — Progress Notes (Signed)
ANTICOAGULATION CONSULT NOTE - Initial Consult  Pharmacy Consult for warfarin Indication: atrial fibrillation  No Known Allergies  Patient Measurements: Height: 5\' 6"  (167.6 cm) Weight: 125 lb (56.7 kg) IBW/kg (Calculated) : 59.3  Vital Signs: Temp: 97.8 F (36.6 C) (02/10 1508) Temp Source: Oral (02/10 1508) BP: 85/50 (02/10 1654) Pulse Rate: 58 (02/10 1655)  Labs:  Recent Labs  06/27/16 1429 06/27/16 1540  HGB 11.1*  --   HCT 32.9*  --   PLT 122*  --   LABPROT  --  35.0*  INR  --  3.38  CREATININE 1.28*  --   CKTOTAL  --  506*    Estimated Creatinine Clearance: 27.7 mL/min (by C-G formula based on SCr of 1.28 mg/dL (H)).   Medical History: Past Medical History:  Diagnosis Date  . Atrial fibrillation (HCC)   . Chronic systolic CHF (congestive heart failure) (HCC) 04/05/2012  . Dilated cardiomyopathy (HCC)    with ejection fraction of 30-35%  . Dysphagia   . Esophageal stricture   . History of shingles   . Hypertension   . Hypothyroidism   . Lightheadedness     Assessment: 74 yof presented to the ED with weakness and a fall. She is on chronic coumadin for history of afib. INR is supratherapeutic. Last dose was this morning. No bleeding noted.   Goal of Therapy:  INR 2-3 Monitor platelets by anticoagulation protocol: Yes   Plan:  No warfarin tonight Daily INR  Gabriela Hernandez, Gabriela Hernandez 06/27/2016,5:02 PM

## 2016-06-27 NOTE — ED Notes (Signed)
Patient transported to Ultrasound 

## 2016-06-27 NOTE — ED Notes (Signed)
Patient transported to X-ray 

## 2016-06-27 NOTE — ED Triage Notes (Signed)
Pt states "I dont have any energy, and I fell yesterday, I lost my balance." Pt also c/o diarrhea. Pt denies belly pain. States she fell because she was weak. Pt states that she hit the back of her head, denies LOC. Pt c/o generalized body aches. Pt is AAOx4.

## 2016-06-27 NOTE — ED Notes (Signed)
Report attempted SDU

## 2016-06-28 ENCOUNTER — Inpatient Hospital Stay (HOSPITAL_COMMUNITY): Payer: Medicare Other

## 2016-06-28 DIAGNOSIS — R6521 Severe sepsis with septic shock: Secondary | ICD-10-CM

## 2016-06-28 DIAGNOSIS — R197 Diarrhea, unspecified: Secondary | ICD-10-CM | POA: Diagnosis not present

## 2016-06-28 DIAGNOSIS — J9601 Acute respiratory failure with hypoxia: Secondary | ICD-10-CM | POA: Diagnosis present

## 2016-06-28 DIAGNOSIS — I482 Chronic atrial fibrillation: Secondary | ICD-10-CM | POA: Diagnosis present

## 2016-06-28 DIAGNOSIS — R040 Epistaxis: Secondary | ICD-10-CM | POA: Diagnosis present

## 2016-06-28 DIAGNOSIS — I959 Hypotension, unspecified: Secondary | ICD-10-CM | POA: Diagnosis not present

## 2016-06-28 DIAGNOSIS — T796XXD Traumatic ischemia of muscle, subsequent encounter: Secondary | ICD-10-CM | POA: Diagnosis not present

## 2016-06-28 DIAGNOSIS — N390 Urinary tract infection, site not specified: Secondary | ICD-10-CM | POA: Diagnosis present

## 2016-06-28 DIAGNOSIS — N179 Acute kidney failure, unspecified: Secondary | ICD-10-CM | POA: Diagnosis present

## 2016-06-28 DIAGNOSIS — D6959 Other secondary thrombocytopenia: Secondary | ICD-10-CM | POA: Diagnosis present

## 2016-06-28 DIAGNOSIS — K5730 Diverticulosis of large intestine without perforation or abscess without bleeding: Secondary | ICD-10-CM | POA: Diagnosis present

## 2016-06-28 DIAGNOSIS — A4151 Sepsis due to Escherichia coli [E. coli]: Secondary | ICD-10-CM | POA: Diagnosis present

## 2016-06-28 DIAGNOSIS — J810 Acute pulmonary edema: Secondary | ICD-10-CM

## 2016-06-28 DIAGNOSIS — I5043 Acute on chronic combined systolic (congestive) and diastolic (congestive) heart failure: Secondary | ICD-10-CM | POA: Diagnosis present

## 2016-06-28 DIAGNOSIS — I420 Dilated cardiomyopathy: Secondary | ICD-10-CM | POA: Diagnosis present

## 2016-06-28 DIAGNOSIS — I248 Other forms of acute ischemic heart disease: Secondary | ICD-10-CM | POA: Diagnosis present

## 2016-06-28 DIAGNOSIS — Z66 Do not resuscitate: Secondary | ICD-10-CM | POA: Diagnosis present

## 2016-06-28 DIAGNOSIS — R748 Abnormal levels of other serum enzymes: Secondary | ICD-10-CM | POA: Diagnosis present

## 2016-06-28 DIAGNOSIS — R7989 Other specified abnormal findings of blood chemistry: Secondary | ICD-10-CM | POA: Diagnosis present

## 2016-06-28 DIAGNOSIS — M6282 Rhabdomyolysis: Secondary | ICD-10-CM | POA: Diagnosis present

## 2016-06-28 DIAGNOSIS — E039 Hypothyroidism, unspecified: Secondary | ICD-10-CM | POA: Diagnosis present

## 2016-06-28 DIAGNOSIS — A419 Sepsis, unspecified organism: Secondary | ICD-10-CM

## 2016-06-28 DIAGNOSIS — R778 Other specified abnormalities of plasma proteins: Secondary | ICD-10-CM | POA: Diagnosis present

## 2016-06-28 DIAGNOSIS — E43 Unspecified severe protein-calorie malnutrition: Secondary | ICD-10-CM | POA: Diagnosis present

## 2016-06-28 DIAGNOSIS — E871 Hypo-osmolality and hyponatremia: Secondary | ICD-10-CM | POA: Diagnosis present

## 2016-06-28 DIAGNOSIS — W19XXXA Unspecified fall, initial encounter: Secondary | ICD-10-CM | POA: Diagnosis present

## 2016-06-28 DIAGNOSIS — Z6821 Body mass index (BMI) 21.0-21.9, adult: Secondary | ICD-10-CM | POA: Diagnosis not present

## 2016-06-28 DIAGNOSIS — I110 Hypertensive heart disease with heart failure: Secondary | ICD-10-CM | POA: Diagnosis present

## 2016-06-28 DIAGNOSIS — E860 Dehydration: Secondary | ICD-10-CM | POA: Diagnosis present

## 2016-06-28 DIAGNOSIS — Z7901 Long term (current) use of anticoagulants: Secondary | ICD-10-CM | POA: Diagnosis not present

## 2016-06-28 DIAGNOSIS — E861 Hypovolemia: Secondary | ICD-10-CM | POA: Diagnosis present

## 2016-06-28 LAB — COMPREHENSIVE METABOLIC PANEL
ALT: 35 U/L (ref 14–54)
AST: 52 U/L — AB (ref 15–41)
Albumin: 2.5 g/dL — ABNORMAL LOW (ref 3.5–5.0)
Alkaline Phosphatase: 45 U/L (ref 38–126)
Anion gap: 11 (ref 5–15)
BUN: 32 mg/dL — AB (ref 6–20)
CHLORIDE: 96 mmol/L — AB (ref 101–111)
CO2: 22 mmol/L (ref 22–32)
Calcium: 8.3 mg/dL — ABNORMAL LOW (ref 8.9–10.3)
Creatinine, Ser: 1.05 mg/dL — ABNORMAL HIGH (ref 0.44–1.00)
GFR calc Af Amer: 54 mL/min — ABNORMAL LOW (ref >60)
GFR calc non Af Amer: 46 mL/min — ABNORMAL LOW (ref >60)
GLUCOSE: 99 mg/dL (ref 65–99)
POTASSIUM: 3.6 mmol/L (ref 3.5–5.1)
SODIUM: 129 mmol/L — AB (ref 135–145)
Total Bilirubin: 1.8 mg/dL — ABNORMAL HIGH (ref 0.3–1.2)
Total Protein: 4.7 g/dL — ABNORMAL LOW (ref 6.5–8.1)

## 2016-06-28 LAB — GASTROINTESTINAL PANEL BY PCR, STOOL (REPLACES STOOL CULTURE)

## 2016-06-28 LAB — PROTIME-INR
INR: 4.64
Prothrombin Time: 45.4 seconds — ABNORMAL HIGH (ref 11.4–15.2)

## 2016-06-28 LAB — BLOOD CULTURE ID PANEL (REFLEXED)
Acinetobacter baumannii: NOT DETECTED
CANDIDA KRUSEI: NOT DETECTED
CARBAPENEM RESISTANCE: NOT DETECTED
Candida albicans: NOT DETECTED
Candida glabrata: NOT DETECTED
Candida parapsilosis: NOT DETECTED
Candida tropicalis: NOT DETECTED
ENTEROBACTERIACEAE SPECIES: DETECTED — AB
ENTEROCOCCUS SPECIES: NOT DETECTED
Enterobacter cloacae complex: NOT DETECTED
Escherichia coli: DETECTED — AB
HAEMOPHILUS INFLUENZAE: NOT DETECTED
KLEBSIELLA OXYTOCA: NOT DETECTED
Klebsiella pneumoniae: NOT DETECTED
LISTERIA MONOCYTOGENES: NOT DETECTED
Neisseria meningitidis: NOT DETECTED
PSEUDOMONAS AERUGINOSA: NOT DETECTED
Proteus species: NOT DETECTED
STAPHYLOCOCCUS AUREUS BCID: NOT DETECTED
STREPTOCOCCUS PNEUMONIAE: NOT DETECTED
STREPTOCOCCUS PYOGENES: NOT DETECTED
STREPTOCOCCUS SPECIES: NOT DETECTED
Serratia marcescens: NOT DETECTED
Staphylococcus species: NOT DETECTED
Streptococcus agalactiae: NOT DETECTED

## 2016-06-28 LAB — BASIC METABOLIC PANEL
Anion gap: 7 (ref 5–15)
Anion gap: 12 (ref 5–15)
BUN: 32 mg/dL — AB (ref 6–20)
BUN: 31 mg/dL — ABNORMAL HIGH (ref 6–20)
CHLORIDE: 100 mmol/L — AB (ref 101–111)
CHLORIDE: 96 mmol/L — AB (ref 101–111)
CO2: 22 mmol/L (ref 22–32)
CO2: 21 mmol/L — ABNORMAL LOW (ref 22–32)
CREATININE: 1.02 mg/dL — AB (ref 0.44–1.00)
Calcium: 8.5 mg/dL — ABNORMAL LOW (ref 8.9–10.3)
Calcium: 8.6 mg/dL — ABNORMAL LOW (ref 8.9–10.3)
Creatinine, Ser: 1.03 mg/dL — ABNORMAL HIGH (ref 0.44–1.00)
GFR calc Af Amer: 55 mL/min — ABNORMAL LOW (ref >60)
GFR calc Af Amer: 56 mL/min — ABNORMAL LOW (ref >60)
GFR calc non Af Amer: 47 mL/min — ABNORMAL LOW (ref >60)
GFR calc non Af Amer: 48 mL/min — ABNORMAL LOW (ref >60)
GLUCOSE: 99 mg/dL (ref 65–99)
Glucose, Bld: 147 mg/dL — ABNORMAL HIGH (ref 65–99)
POTASSIUM: 4 mmol/L (ref 3.5–5.1)
Potassium: 3.9 mmol/L (ref 3.5–5.1)
SODIUM: 129 mmol/L — AB (ref 135–145)
SODIUM: 129 mmol/L — AB (ref 135–145)

## 2016-06-28 LAB — C DIFFICILE QUICK SCREEN W PCR REFLEX
C DIFFICLE (CDIFF) ANTIGEN: NEGATIVE
C Diff interpretation: NOT DETECTED
C Diff toxin: NEGATIVE

## 2016-06-28 LAB — DIC (DISSEMINATED INTRAVASCULAR COAGULATION) PANEL
PLATELETS: 86 10*3/uL — AB (ref 150–400)
SMEAR REVIEW: NONE SEEN

## 2016-06-28 LAB — BLOOD GAS, ARTERIAL
ACID-BASE DEFICIT: 3.4 mmol/L — AB (ref 0.0–2.0)
BICARBONATE: 20.5 mmol/L (ref 20.0–28.0)
Drawn by: 105521
FIO2: 0.21
O2 Saturation: 91.9 %
PCO2 ART: 33.1 mmHg (ref 32.0–48.0)
PH ART: 7.408 (ref 7.350–7.450)
Patient temperature: 98.6
pO2, Arterial: 62.7 mmHg — ABNORMAL LOW (ref 83.0–108.0)

## 2016-06-28 LAB — CBC
HCT: 30.4 % — ABNORMAL LOW (ref 36.0–46.0)
Hemoglobin: 10.2 g/dL — ABNORMAL LOW (ref 12.0–15.0)
MCH: 30 pg (ref 26.0–34.0)
MCHC: 33.6 g/dL (ref 30.0–36.0)
MCV: 89.4 fL (ref 78.0–100.0)
PLATELETS: 82 10*3/uL — AB (ref 150–400)
RBC: 3.4 MIL/uL — ABNORMAL LOW (ref 3.87–5.11)
RDW: 17.9 % — AB (ref 11.5–15.5)
WBC: 6.6 10*3/uL (ref 4.0–10.5)

## 2016-06-28 LAB — MAGNESIUM: MAGNESIUM: 2.2 mg/dL (ref 1.7–2.4)

## 2016-06-28 LAB — TROPONIN I: Troponin I: 0.14 ng/mL (ref <0.03)

## 2016-06-28 LAB — LACTIC ACID, PLASMA
LACTIC ACID, VENOUS: 1.7 mmol/L (ref 0.5–1.9)
Lactic Acid, Venous: 1.5 mmol/L (ref 0.5–1.9)

## 2016-06-28 LAB — DIC (DISSEMINATED INTRAVASCULAR COAGULATION)PANEL
D-Dimer, Quant: 2.09 ug/mL-FEU — ABNORMAL HIGH (ref 0.00–0.50)
Fibrinogen: 435 mg/dL (ref 210–475)
INR: 4.68
Prothrombin Time: 45.3 seconds — ABNORMAL HIGH (ref 11.4–15.2)
aPTT: 61 seconds — ABNORMAL HIGH (ref 24–36)

## 2016-06-28 LAB — APTT: aPTT: 57 seconds — ABNORMAL HIGH (ref 24–36)

## 2016-06-28 LAB — PROCALCITONIN: PROCALCITONIN: 7.08 ng/mL

## 2016-06-28 LAB — CK: CK TOTAL: 654 U/L — AB (ref 38–234)

## 2016-06-28 MED ORDER — DEXTROSE 5 % IV SOLN
2.0000 g | INTRAVENOUS | Status: DC
  Administered 2016-06-28 – 2016-07-03 (×6): 2 g via INTRAVENOUS
  Filled 2016-06-28 (×6): qty 2

## 2016-06-28 MED ORDER — FUROSEMIDE 10 MG/ML IJ SOLN
20.0000 mg | Freq: Once | INTRAMUSCULAR | Status: AC
  Administered 2016-06-28: 20 mg via INTRAVENOUS
  Filled 2016-06-28: qty 2

## 2016-06-28 MED ORDER — SODIUM CHLORIDE 0.9 % IV BOLUS (SEPSIS)
1000.0000 mL | Freq: Once | INTRAVENOUS | Status: AC
  Administered 2016-06-28: 1000 mL via INTRAVENOUS

## 2016-06-28 MED ORDER — SODIUM CHLORIDE 0.9 % IV SOLN: INTRAVENOUS | Status: DC

## 2016-06-28 MED ORDER — SODIUM CHLORIDE 0.9 % IV BOLUS (SEPSIS)
250.0000 mL | Freq: Once | INTRAVENOUS | Status: AC
  Administered 2016-06-28: 250 mL via INTRAVENOUS

## 2016-06-28 NOTE — Progress Notes (Signed)
Progress Note    Gabriela Hernandez  UJW:[id number] DOB: Feb 25, 1927  DOA: 06/27/2016 PCP: Lupe Carney, MD    Brief Narrative:   Chief complaint: Follow-up diarrhea and weakness  Gabriela Hernandez is an 83 y.o. female with a PMH of atrial fibrillation on chronic Coumadin, dilated nonischemic cardiomyopathy with preserved EF per echo 05/20/16, hypothyroidism and hypertension who was admitted 06/27/16 with a chief complaint of diarrhea and weakness with reports of a fall. Upon initial evaluation in the ED, the patient was found to be hypotensive.  Assessment/Plan:   Principal Problem:   Sepsis secondary to Escherichia coli bacteremia/hypotension/UTI Blood pressure 71/37 on admission. Hypotension initially thought to be from hypovolemia. Fluid volume resuscitated with improved blood pressure overnight. 3/4 blood cultures growing Escherichia coli, so placed on ceftriaxone. Sepsis order set placed. We'll follow lactic acid improved calcitonin. Critical care team consulted due to high risk of deterioration. The patient has increased work of breathing, and is volume overloaded clinically with +JVD and 3+ lower extremity edema.  KVO IVF.  Active Problems:   Elevated CK/rhabdomyolysis secondary to fall  Elevated CK levels likely related to rhabdomyolysis from fall. Hydrate and monitor.    Demand ischemia/troponin elevation/history of chronic systolic CHF/Ventricular tachycardia Suspect hypotension-induced demand ischemia. 12-lead EKG done on admission (personally reviewed) shows atrial fibrillation. We'll repeat a 12-lead EKG this morning. ? V tach on monitor in room.  Last 2-D echo showed normal EF. KVO IVF as she is clinically volume overloaded.     Thrombocytopenia Likely related to sepsis. Check DIC panel. Treat underlying infection and follow platelets.    Acute kidney injury Baseline creatinine is 0.5. Current creatinine elevated over usual baseline values. Likely prerenal from  hypotension. Continue fluid volume resuscitation.    Atrial fibrillation (HCC) Rate controlled on chronic Coumadin. INR supratherapeutic on admission. Coumadin per pharmacy.    Dehydration/Diarrhea Clinically volume overloaded.  KVO IVF.    Hyponatremia Likely from hypovolemia, monitor.    Fall PT evaluation when medically stable.    Hypothyroidism Continue Synthroid.    Hyperbilirubinemia Bilirubin mildly elevated. CT of the abdomen showed cholelithiasis without complicating factors and no biliary dilatation. RUQ ultrasound also showed cholelithiasis with mild wall thickening but no other focal abnormalities. Bilirubin improving with hydration, continue to monitor. No reports of abdominal pain.    Severe protein calorie malnutrition/underweight Body mass index is 21.67 kg/m. Dietitian consultation.   Family Communication/Anticipated D/C date and plan/Code Status   DVT prophylaxis: Coumadin ordered. Code Status: Full Code.  Family Communication: Daughter at the bedside. Disposition Plan: To be determined.   Medical Consultants:    PCCM  Cardiology   Procedures:    None  Anti-Infectives:    None  Subjective:   The patient is awake and alert, feels short of breath (says she has been short of breath for months), but no recent URI symptoms, no abdominal pain or nausea/vomiting.  + recent diarrhea (recently on antibiotics for UTI), ongoing dysuria.  Objective:    Vitals:   06/28/16 0300 06/28/16 0409 06/28/16 0500 06/28/16 0700  BP: (!) 75/43 (!) 76/44 (!) 82/40 (!) 92/55  Pulse: 79 80 72 (!) 52  Resp: (!) 23 20 19 20   Temp:  98.1 F (36.7 C)    TempSrc:  Oral    SpO2: 92% 94% 94% 96%  Weight: 60.9 kg (134 lb 4.2 oz)     Height:        Intake/Output Summary (Last 24 hours) at 06/28/16  9147 Last data filed at 06/28/16 0615  Gross per 24 hour  Intake          3923.33 ml  Output              300 ml  Net          3623.33 ml   Filed Weights    06/27/16 1543 06/27/16 2004 06/28/16 0300  Weight: 56.7 kg (125 lb) 59.3 kg (130 lb 11.7 oz) 60.9 kg (134 lb 4.2 oz)    Exam: General exam: Appears calm but using accessory muscles to breathe. Respiratory system: A few wheezes, bilateral crackles, labored breathing with tachypnea. Cardiovascular system: S1 & S2 heard, RRR. + prominent JVD,  rubs, gallops or clicks. No murmurs. Gastrointestinal system: Abdomen is nondistended, soft and nontender. No organomegaly or masses felt. Normal bowel sounds heard. Central nervous system: Alert and oriented. No focal neurological deficits. Extremities: No clubbing, or cyanosis. 3+ edema. Skin: Venous stasis dermatitis. Psychiatry: Judgement and insight appear normal. Mood & affect appropriate.   Data Reviewed:   I have personally reviewed following labs and imaging studies:  Labs: Basic Metabolic Panel:  Recent Labs Lab 06/27/16 1429 06/27/16 1540 06/27/16 1936 06/27/16 2252 06/28/16 0428  NA 126*  --  128* 131* 129*  K 3.5  --  3.4* 3.8 3.6  CL 91*  --  98* 99* 96*  CO2 23  --  20* 21* 22  GLUCOSE 119*  --  105* 126* 99  BUN 33*  --  31* 29* 32*  CREATININE 1.28*  --  1.06* 1.02* 1.05*  CALCIUM 9.1  --  7.9* 8.3* 8.3*  MG  --  1.7  --   --  2.2   GFR Estimated Creatinine Clearance: 35.3 mL/min (by C-G formula based on SCr of 1.05 mg/dL (H)). Liver Function Tests:  Recent Labs Lab 06/27/16 1540 06/28/16 0428  AST 52* 52*  ALT 34 35  ALKPHOS 50 45  BILITOT 2.9* 1.8*  PROT 5.2* 4.7*  ALBUMIN 3.0* 2.5*   No results for input(s): LIPASE, AMYLASE in the last 168 hours. No results for input(s): AMMONIA in the last 168 hours. Coagulation profile  Recent Labs Lab 06/27/16 1540 06/28/16 0428  INR 3.38 4.64*    CBC:  Recent Labs Lab 06/27/16 1429 06/28/16 0428  WBC 8.9 6.6  HGB 11.1* 10.2*  HCT 32.9* 30.4*  MCV 90.1 89.4  PLT 122* 82*   Cardiac Enzymes:  Recent Labs Lab 06/27/16 1540 06/27/16 1936  06/27/16 2252 06/28/16 0428  CKTOTAL 506*  --   --  654*  TROPONINI  --  0.03* 0.03* 0.14*   BNP (last 3 results) No results for input(s): PROBNP in the last 8760 hours. CBG:  Recent Labs Lab 06/27/16 1521  GLUCAP 121*   D-Dimer: No results for input(s): DDIMER in the last 72 hours. Hgb A1c: No results for input(s): HGBA1C in the last 72 hours. Lipid Profile: No results for input(s): CHOL, HDL, LDLCALC, TRIG, CHOLHDL, LDLDIRECT in the last 72 hours. Thyroid function studies:  Recent Labs  06/27/16 1540  TSH 1.168   Anemia work up: No results for input(s): VITAMINB12, FOLATE, FERRITIN, TIBC, IRON, RETICCTPCT in the last 72 hours. Sepsis Labs:  Recent Labs Lab 06/27/16 1429 06/27/16 1450 06/27/16 1936 06/27/16 1940 06/28/16 0428  PROCALCITON  --   --  6.63  --   --   WBC 8.9  --   --   --  6.6  LATICACIDVEN  --  2.52*  --  1.07  --     Microbiology Recent Results (from the past 240 hour(s))  Blood culture (routine x 2)     Status: None (Preliminary result)   Collection Time: 06/27/16  3:25 PM  Result Value Ref Range Status   Specimen Description BLOOD LEFT ANTECUBITAL  Final   Special Requests BOTTLES DRAWN AEROBIC ONLY 5CC  Final   Culture  Setup Time   Final    GRAM NEGATIVE RODS ANAEROBIC BOTTLE ONLY CRITICAL VALUE NOTED.  VALUE IS CONSISTENT WITH PREVIOUSLY REPORTED AND CALLED VALUE.    Culture GRAM NEGATIVE RODS  Final   Report Status PENDING  Incomplete  Blood culture (routine x 2)     Status: None (Preliminary result)   Collection Time: 06/27/16  3:38 PM  Result Value Ref Range Status   Specimen Description BLOOD RIGHT ANTECUBITAL  Final   Special Requests BOTTLES DRAWN AEROBIC AND ANAEROBIC 5CC  Final   Culture  Setup Time   Final    GRAM NEGATIVE RODS IN BOTH AEROBIC AND ANAEROBIC BOTTLES Organism ID to follow CRITICAL RESULT CALLED TO, READ BACK BY AND VERIFIED WITH: A ANDERSON,PHARMD AT 0821 06/28/16 BY L BENFIELD    Culture GRAM NEGATIVE  RODS  Final   Report Status PENDING  Incomplete  Blood Culture ID Panel (Reflexed)     Status: Abnormal   Collection Time: 06/27/16  3:38 PM  Result Value Ref Range Status   Enterococcus species NOT DETECTED NOT DETECTED Final   Listeria monocytogenes NOT DETECTED NOT DETECTED Final   Staphylococcus species NOT DETECTED NOT DETECTED Final   Staphylococcus aureus NOT DETECTED NOT DETECTED Final   Streptococcus species NOT DETECTED NOT DETECTED Final   Streptococcus agalactiae NOT DETECTED NOT DETECTED Final   Streptococcus pneumoniae NOT DETECTED NOT DETECTED Final   Streptococcus pyogenes NOT DETECTED NOT DETECTED Final   Acinetobacter baumannii NOT DETECTED NOT DETECTED Final   Enterobacteriaceae species DETECTED (A) NOT DETECTED Final    Comment: Enterobacteriaceae represent a large family of gram-negative bacteria, not a single organism. CRITICAL RESULT CALLED TO, READ BACK BY AND VERIFIED WITH: A ANDERSON,PHARMD AT 0821 06/28/16 BY L BENFIELD    Enterobacter cloacae complex NOT DETECTED NOT DETECTED Final   Escherichia coli DETECTED (A) NOT DETECTED Final    Comment: CRITICAL RESULT CALLED TO, READ BACK BY AND VERIFIED WITH: A ANDERSON,PHARMD AT 0821 06/28/16 BY L BENFIELD    Klebsiella oxytoca NOT DETECTED NOT DETECTED Final   Klebsiella pneumoniae NOT DETECTED NOT DETECTED Final   Proteus species NOT DETECTED NOT DETECTED Final   Serratia marcescens NOT DETECTED NOT DETECTED Final   Carbapenem resistance NOT DETECTED NOT DETECTED Final   Haemophilus influenzae NOT DETECTED NOT DETECTED Final   Neisseria meningitidis NOT DETECTED NOT DETECTED Final   Pseudomonas aeruginosa NOT DETECTED NOT DETECTED Final   Candida albicans NOT DETECTED NOT DETECTED Final   Candida glabrata NOT DETECTED NOT DETECTED Final   Candida krusei NOT DETECTED NOT DETECTED Final   Candida parapsilosis NOT DETECTED NOT DETECTED Final   Candida tropicalis NOT DETECTED NOT DETECTED Final  MRSA PCR  Screening     Status: None   Collection Time: 06/27/16  8:28 PM  Result Value Ref Range Status   MRSA by PCR NEGATIVE NEGATIVE Final    Comment:        The GeneXpert MRSA Assay (FDA approved for NASAL specimens only), is one component of a comprehensive MRSA colonization surveillance program. It is not  intended to diagnose MRSA infection nor to guide or monitor treatment for MRSA infections.   C difficile quick scan w PCR reflex     Status: None   Collection Time: 06/27/16  8:33 PM  Result Value Ref Range Status   C Diff antigen NEGATIVE NEGATIVE Final   C Diff toxin NEGATIVE NEGATIVE Final   C Diff interpretation No C. difficile detected.  Final    Radiology: Ct Abdomen Pelvis Wo Contrast  Result Date: 06/27/2016 CLINICAL DATA:  Hyperbilirubinemia EXAM: CT ABDOMEN AND PELVIS WITHOUT CONTRAST TECHNIQUE: Multidetector CT imaging of the abdomen and pelvis was performed following the standard protocol without IV contrast. COMPARISON:  Ultrasound from earlier in the same day FINDINGS: Lower chest: Mild atelectatic changes are noted in the left lung base. Pectus excavatum is seen. The cardiac shadow is enlarged and somewhat distorted by the pectus deformity. Hepatobiliary: The liver shows a small hypodensity consistent with cysts. This was not well appreciated on the recent ultrasound. The gallbladder is well distended with cholelithiasis. No significant pericholecystic fluid is seen. Pancreas: Unremarkable. No pancreatic ductal dilatation or surrounding inflammatory changes. Spleen: Normal in size without focal abnormality. Adrenals/Urinary Tract: The adrenal glands are within normal limits. The right kidney demonstrates a prominent extrarenal pelvis although no obstructive changes are seen. The left kidney is partially pelvic in location. No obstructive changes or calculi are seen. Stomach/Bowel: Diverticulosis of the colon is noted without evidence of diverticulitis. The appendix is not well  visualized consistent with the surgical history. Vascular/Lymphatic: Aortic atherosclerosis. No enlarged abdominal or pelvic lymph nodes. Reproductive: Uterus and bilateral adnexa are unremarkable. Other: No abdominal wall hernia or abnormality. No abdominopelvic ascites. Musculoskeletal: Degenerative changes of lumbar spine are seen. IMPRESSION: Cholelithiasis without complicating factors. No biliary dilatation is seen. Hepatic cyst. No acute abnormality noted. Electronically Signed   By: Alcide Clever M.D.   On: 06/27/2016 18:32   Dg Chest 2 View  Result Date: 06/27/2016 CLINICAL DATA:  Hypotension. EXAM: CHEST  2 VIEW COMPARISON:  None 07/07/2013 FINDINGS: Marked cardiomegaly. No confluent airspace opacities, effusions or edema. No acute bony abnormality. IMPRESSION: Marked cardiomegaly.  No active disease. Electronically Signed   By: Charlett Nose M.D.   On: 06/27/2016 16:17   Ct Head Wo Contrast  Result Date: 06/27/2016 CLINICAL DATA:  Fall, posterior head injury EXAM: CT HEAD WITHOUT CONTRAST TECHNIQUE: Contiguous axial images were obtained from the base of the skull through the vertex without intravenous contrast. COMPARISON:  03/08/2015 FINDINGS: Brain: No evidence of acute infarction, hemorrhage, hydrocephalus, extra-axial collection or mass lesion/mass effect. Global cortical atrophy. Subcortical white matter and periventricular small vessel ischemic changes. Vascular: No hyperdense vessel or unexpected calcification. Skull: Normal. Negative for fracture or focal lesion. Sinuses/Orbits: Partial opacification of the left frontal sinus. Visualized paranasal sinuses the mastoid air cells are otherwise clear. Other: None. IMPRESSION: No evidence of acute intracranial abnormality. Atrophy with small vessel ischemic changes. Electronically Signed   By: Charline Bills M.D.   On: 06/27/2016 16:47   US Abdomen Limited Ruq  Result Date: 06/27/2016 CLINICAL DATA:  Elevated bilirubin EXAM: US ABDOMEN  LIMITED - RIGHT UPPER QUADRANT COMPARISON:  None. FINDINGS: Gallbladder: Gallbladder is well distended with multiple stones. Mild gallbladder wall thickening is noted. No pericholecystic fluid is seen. Common bile duct: Diameter: 3 mm. Liver: Mildly heterogeneous although no focal lesion is seen. No definitive biliary obstruction is noted. IMPRESSION: Cholelithiasis with mild wall thickening. No other focal abnormality is seen. Electronically Signed   By:  Alcide Clever M.D.   On: 06/27/2016 18:19    Medications:   . carvedilol  25 mg Oral BID WC  . levothyroxine  112 mcg Oral QAC breakfast  . sodium chloride flush  3 mL Intravenous Q12H  . Warfarin - Pharmacist Dosing Inpatient   Does not apply q1800   Continuous Infusions:  Medical decision making is of high complexity and this patient is at high risk of deterioration, therefore this is a level 3 visit.  (> 4 problem points, >4 data points, high risk)   LOS: 0 days   RAMA,CHRISTINA  [facility] Pager [phone]. If unable to reach me by pager, please call my cell phone at [phone].  *Please refer to [email or web address], password [email or web address] to get updated schedule on who will round on this patient, as hospitalists switch teams weekly. If 7PM-7AM, please contact night-coverage at [email or web address], password [email or web address] for any overnight needs.  06/28/2016, 8:35 AM

## 2016-06-28 NOTE — Consult Note (Addendum)
CARDIOLOGY CONSULT NOTE   Patient ID: Gabriela Hernandez MRN: [id number] DOB/AGE: 81-Mar-1928 81 y.o.  Admit date: 06/27/2016  Requesting Physician: Dr. Darnelle Catalan Primary Physician:   Lupe Carney, MD Primary Cardiologist:  Dr. Swaziland.   Reason for Consultation: CHF and possible VT.   HPI: Gabriela Hernandez is a 83 y.o. female with a history of NICM with improvement of EF to normal, mild-mod MR, chronic atrial fibrillation on coumadin, HTN and hypothyroidism who presented to [facility] on 06/27/16 with diarrhea, weakness and fall. Found to have urosepsis and Ecoli bacteremia. Cardiology consulted for possible VT noted on tele and CHF.   She has a history of nonischemic cardiomyopathy with prior echo demonstrated an ejection fraction of 30-35%. Echocardiogram in October 2014 showed significant improvement in her ejection fraction to 55-60%. She was last seen by Dr. Swaziland recently on 06/05/16 for follow up. She was doing quite well. 2D ECHO showed continued normal LVEF and mild MR. Therefore, Gabriela Hernandez was discontinued and restarted on Quinapril. She also has chronic atrial fib and Coreg had to be reduced 2/2 bradycardia in the past. HR was well controlled ~60 at last visit.   She presented to [facility] on 06/27/16 with complaints of generalized weakness and diarrhea during the last 2-3 days. Patient complained of being unstable on her feet and she fell 06/26/16 after she lost her balance. She landed on her back and head, but denied loss of consciousness.   On presentation to the emergency department she was hypotensive with blood pressure of 70/50 mmHg, was given IV fluids and blood pressure improved to 95/58. Patient was afebrile, blood work showed hyponatremia with sodium 126, mildly elevated creatinine 1.28 and BUN 33 and mildly reduced platelets of 126,000 and Hgb 11.1.Lactic acid was slightly elevated at 2.52. Magnesium was borderline low-1.7, troponin was normal but CPK elevated at 506 and might be  associated with fall.  Chest x-ray revealed marked cardiomegaly, no airspace opacities, no pleural effusions. EKG revealed atrial fibrillation and old anterolateral Q waves present on previous EKGs. Head CT was negative for acute abnormalities  She was found to have urosepsis with E Coli bacteremia. Pressures have remained soft. She is being treated with sepsis protocol. Dr. Molli Knock with PCCM also consulted. Per discussion with Dr. Molli Knock and Dr. Graciela Husbands, plan is to hold off on diuresis at this time. Continue to treat with sepsis protocol and ABx.   She is sitting up in room with SOB, orthopnea and tachypnea. No chest pain. No syncope. Still feeling weak but improved from yesterday.    Past Medical History:  Diagnosis Date  . Atrial fibrillation (HCC)   . Chronic systolic CHF (congestive heart failure) (HCC) 04/05/2012  . Dilated cardiomyopathy (HCC)    with ejection fraction of 30-35%  . Dysphagia   . Esophageal stricture   . History of shingles   . Hypertension   . Hypothyroidism   . Lightheadedness      Past Surgical History:  Procedure Laterality Date  . APPENDECTOMY    . CATARACT EXTRACTION    . CESAREAN SECTION    . FOOT SURGERY    . THYROIDECTOMY    . TONSILLECTOMY    . VEIN LIGATION      No Known Allergies  I have reviewed the patient's current medications . carvedilol  25 mg Oral BID WC  . cefTRIAXone (ROCEPHIN)  IV  2 g Intravenous Q24H  . levothyroxine  112 mcg Oral QAC breakfast  . sodium  chloride flush  3 mL Intravenous Q12H  . Warfarin - Pharmacist Dosing Inpatient   Does not apply q1800   . sodium chloride     acetaminophen **OR** acetaminophen, ondansetron **OR** ondansetron (ZOFRAN) IV, polyethylene glycol, traZODone  Prior to Admission medications   Medication Sig Start Date End Date Taking? Authorizing Provider  carvedilol (COREG) 25 MG tablet TAKE 1/2 TABLET BY MOUTH TWICE DAILY WITH A MEAL 06/19/16  Yes Peter M Swaziland, MD  furosemide (LASIX) 20 MG  tablet Take 2 tablets (40 mg total) by mouth every morning and take 1 tablet (20 mg total) by mouth every evening. 06/08/16  Yes Peter M Swaziland, MD  levothyroxine (SYNTHROID, LEVOTHROID) 112 MCG tablet Take 112 mcg by mouth daily before breakfast.   Yes Historical Provider, MD  Multiple Vitamins-Minerals (MULTIVITAMIN WITH MINERALS) tablet Take 1 tablet by mouth daily.   Yes Historical Provider, MD  quinapril (ACCUPRIL) 20 MG tablet Take 0.5 tablets (10 mg total) by mouth daily. 06/05/16  Yes Peter M Swaziland, MD  warfarin (COUMADIN) 5 MG tablet Take 1 tablet (5 mg total) by mouth as directed. Patient taking differently: Take 5 mg by mouth every morning.  06/08/16  Yes Peter M Swaziland, MD     Social History   Social History  . Marital status: Married    Spouse name: N/A  . Number of children: 2  . Years of education: N/A   Occupational History  .  Retired   Social History Main Topics  . Smoking status: Never Smoker  . Smokeless tobacco: Never Used  . Alcohol use No  . Drug use: No  . Sexual activity: Not on file   Other Topics Concern  . Not on file   Social History Narrative  . No narrative on file    Family Status  Relation Status  . Mother Deceased at age 70  . Father Deceased at age 67  . Sister Deceased  . Brother Deceased  . Sister Deceased  . Maternal Grandmother Deceased  . Maternal Grandfather Deceased  . Paternal Grandmother Deceased  . Paternal Grandfather Deceased   Family History  Problem Relation Age of Onset  . Hypertension Mother   . Heart attack Father        ROS:  Full 14 point review of systems complete and found to be negative unless listed above.  Physical Exam: Blood pressure (!) 92/55, pulse (!) 52, temperature 98.1 F (36.7 C), temperature source Oral, resp. rate 20, height 5\' 6"  (1.676 m), weight 134 lb 4.2 oz (60.9 kg), SpO2 96 %.  General: Well developed, well nourished, female in no acute distress Head: Eyes PERRLA, No xanthomas.    Normocephalic and atraumatic, oropharynx without edema or exudate.   Lungs: decreased breath sounds at bases Heart: HR S1 S2, no rub/gallop, Heart irregular rate and rhythm with S1, S2 no murmur. pulses are 2+ extrem.  Neck: No carotid bruits. No lymphadenopathy.   ++ JVD. Abdomen: Bowel sounds present, abdomen soft and non-tender without masses or hernias noted. Msk:  No spine or cva tenderness. No weakness, no joint deformities or effusions. Extremities: No clubbing or cyanosis. + bilateral LE edema.  Neuro: Alert and oriented X 3. No focal deficits noted. Psych:  Good affect, responds appropriately Skin: No rashes or lesions noted.  Labs:   Lab Results  Component Value Date   WBC 6.6 06/28/2016   HGB 10.2 (L) 06/28/2016   HCT 30.4 (L) 06/28/2016   MCV 89.4 06/28/2016  PLT 82 (L) 06/28/2016    Recent Labs  06/28/16 0428  INR 4.64*    Recent Labs Lab 06/28/16 0428 06/28/16 0750  NA 129* 129*  K 3.6 4.0  CL 96* 100*  CO2 22 22  BUN 32* 32*  CREATININE 1.05* 1.03*  CALCIUM 8.3* 8.5*  PROT 4.7*  --   BILITOT 1.8*  --   ALKPHOS 45  --   ALT 35  --   AST 52*  --   GLUCOSE 99 99  ALBUMIN 2.5*  --    Magnesium  Date Value Ref Range Status  06/28/2016 2.2 1.7 - 2.4 mg/dL Final    Recent Labs  16/10/96 1540 06/27/16 1936 06/27/16 2252 06/28/16 0428  CKTOTAL 506*  --   --  654*  TROPONINI  --  0.03* 0.03* 0.14*    Recent Labs  06/27/16 1449  TROPIPOC 0.06   Pro B Natriuretic peptide (BNP)  Date/Time Value Ref Range Status  02/06/2014 02:45 PM 131.0 (H) 0.0 - 100.0 pg/mL Final  02/16/2013 04:24 PM 96.0 0.0 - 100.0 pg/mL Final   No results found for: CHOL, HDL, LDLCALC, TRIG No results found for: DDIMER No results found for: LIPASE, AMYLASE TSH  Date/Time Value Ref Range Status  06/27/2016 03:40 PM 1.168 0.350 - 4.500 uIU/mL Final    Comment:    Performed by a 3rd Generation assay with a functional sensitivity of <=0.01 uIU/mL.  02/06/2014 02:45 PM  0.00 Repeated and verified X2. (L) 0.35 - 4.50 uIU/mL Final   No results found for: VITAMINB12, FOLATE, FERRITIN, TIBC, IRON, RETICCTPCT  Echo: 05/20/2016 LV EF: 55% -   60% Study Conclusions - Left ventricle: Systolic function was normal. The estimated   ejection fraction was in the range of 55% to 60%. Wall motion was   normal; there were no regional wall motion abnormalities. - Mitral valve: There was mild to moderate regurgitation directed   centrally. - Left atrium: The atrium was massively dilated. - Right atrium: The atrium was mildly dilated. - Atrial septum: The septum bowed from left to right, consistent   with increased left atrial pressure. No defect or patent foramen   ovale was identified. - Pericardium, extracardiac: A small pericardial effusion was   identified posterior to the heart. There was no evidence of   hemodynamic compromise.  ECG:  afib with CVR HR 65  Radiology:  Ct Abdomen Pelvis Wo Contrast  Result Date: 06/27/2016 CLINICAL DATA:  Hyperbilirubinemia EXAM: CT ABDOMEN AND PELVIS WITHOUT CONTRAST TECHNIQUE: Multidetector CT imaging of the abdomen and pelvis was performed following the standard protocol without IV contrast. COMPARISON:  Ultrasound from earlier in the same day FINDINGS: Lower chest: Mild atelectatic changes are noted in the left lung base. Pectus excavatum is seen. The cardiac shadow is enlarged and somewhat distorted by the pectus deformity. Hepatobiliary: The liver shows a small hypodensity consistent with cysts. This was not well appreciated on the recent ultrasound. The gallbladder is well distended with cholelithiasis. No significant pericholecystic fluid is seen. Pancreas: Unremarkable. No pancreatic ductal dilatation or surrounding inflammatory changes. Spleen: Normal in size without focal abnormality. Adrenals/Urinary Tract: The adrenal glands are within normal limits. The right kidney demonstrates a prominent extrarenal pelvis although no  obstructive changes are seen. The left kidney is partially pelvic in location. No obstructive changes or calculi are seen. Stomach/Bowel: Diverticulosis of the colon is noted without evidence of diverticulitis. The appendix is not well visualized consistent with the surgical history. Vascular/Lymphatic: Aortic atherosclerosis.  No enlarged abdominal or pelvic lymph nodes. Reproductive: Uterus and bilateral adnexa are unremarkable. Other: No abdominal wall hernia or abnormality. No abdominopelvic ascites. Musculoskeletal: Degenerative changes of lumbar spine are seen. IMPRESSION: Cholelithiasis without complicating factors. No biliary dilatation is seen. Hepatic cyst. No acute abnormality noted. Electronically Signed   By: Alcide Clever M.D.   On: 06/27/2016 18:32   Dg Chest 2 View  Result Date: 06/27/2016 CLINICAL DATA:  Hypotension. EXAM: CHEST  2 VIEW COMPARISON:  None 07/07/2013 FINDINGS: Marked cardiomegaly. No confluent airspace opacities, effusions or edema. No acute bony abnormality. IMPRESSION: Marked cardiomegaly.  No active disease. Electronically Signed   By: Charlett Nose M.D.   On: 06/27/2016 16:17   Ct Head Wo Contrast  Result Date: 06/27/2016 CLINICAL DATA:  Fall, posterior head injury EXAM: CT HEAD WITHOUT CONTRAST TECHNIQUE: Contiguous axial images were obtained from the base of the skull through the vertex without intravenous contrast. COMPARISON:  03/08/2015 FINDINGS: Brain: No evidence of acute infarction, hemorrhage, hydrocephalus, extra-axial collection or mass lesion/mass effect. Global cortical atrophy. Subcortical white matter and periventricular small vessel ischemic changes. Vascular: No hyperdense vessel or unexpected calcification. Skull: Normal. Negative for fracture or focal lesion. Sinuses/Orbits: Partial opacification of the left frontal sinus. Visualized paranasal sinuses the mastoid air cells are otherwise clear. Other: None. IMPRESSION: No evidence of acute intracranial  abnormality. Atrophy with small vessel ischemic changes. Electronically Signed   By: Charline Bills M.D.   On: 06/27/2016 16:47   US Abdomen Limited Ruq  Result Date: 06/27/2016 CLINICAL DATA:  Elevated bilirubin EXAM: US ABDOMEN LIMITED - RIGHT UPPER QUADRANT COMPARISON:  None. FINDINGS: Gallbladder: Gallbladder is well distended with multiple stones. Mild gallbladder wall thickening is noted. No pericholecystic fluid is seen. Common bile duct: Diameter: 3 mm. Liver: Mildly heterogeneous although no focal lesion is seen. No definitive biliary obstruction is noted. IMPRESSION: Cholelithiasis with mild wall thickening. No other focal abnormality is seen. Electronically Signed   By: Alcide Clever M.D.   On: 06/27/2016 18:19    ASSESSMENT AND PLAN:    Principal Problem:   Hypotension Active Problems:   Atrial fibrillation (HCC)   Dehydration   Diarrhea   Hyponatremia   Fall   Hypothyroidism   Hyperbilirubinemia   Sepsis (HCC) secondary to Escherichia coli bacteremia   AKI (acute kidney injury) (HCC)   Demand ischemia (HCC)   Elevated troponin   Elevated CK   Rhabdomyolysis  Gabriela Hernandez is a 83 y.o. female with a history of NICM with improvement of EF to normal, mild-mod MR, chronic atrial fibrillation on coumadin, HTN and hypothyroidism who presented to [facility] on 06/27/16 with diarrhea, weakness and fall. Found to have urosepsis and Ecoli bacteremia. Cardiology consulted for possible VT noted on tele and CHF.   Possible VT: tele reviewed by Dr. Graciela Husbands and NO VT, only afib with rate related aberrency.   Acute on chronic diastolic CHF: s/s of volume overload. Dr. Molli Knock with PCCM also consulted. Per discussion with Dr. Molli Knock and Dr. Graciela Husbands, plan is to hold off on diuresis at this time. Continue to treat with sepsis protocol and ABx. When BP improves they will plan to start IV diuresis.   Hx of NICM: EF improved by echo in 2014 and remained normal by most recent echo in 05/2016.  HTN: BP  low currently. Holding antihypertensives at this time.   Chronic afib with a history of bradycardia: rate well controlled currently. On Coumadin for CHADSVASC of at least 5 (CHF, age,  HTN, F sex). INR supratheraputic at this time and holding coumadin.   Hypothyroidism: TSH normal   Sepsis//E Coli 2/2 UTI: per IM    Signed: Cline Crock, PA-C 06/28/2016 10:54 AM  Pager [phone]   The patient has Escherichia coli sepsis. I discussed with the patient and her daughter the differential strategies including a more conservative 1 where in antibiotics are continued without invasive maneuvers and the potential use of pressors. I've also spoken with CCM who favors the former in conjunction with a diuretic. Dr. Molli Knock is concerned about potential arrhythmic in the city of pressor therapy. Our approach is are quite divergent. Having said that however the patient's hemodynamic status over the last 6 hours is improved with systolic blood pressures 60-70 now in the mid 90s. That being the case, the family has elected a more conservative course in the hopes that the antibiotics will continue to be able to address the sepsis and with hemodynamic evidence suggesting that the inflammatory response part of the sepsis picture is attenuating.  They also appreciate that if the hemodynamic situation changes the ability to address it with more invasive strategies is less likely to be successful.  Dr. Swaziland is in the hospital tomorrow we will ask him to follow-up with the patient.  Troponin elevation is trivial in the context of her sepsis syndrome.  Wide-complex tachycardia is rate-related aberration; not ventricular tachycardia  As noted above, I am disinclined to use diuretics in the setting of borderline hypotension.  As her blood pressure improves these make more sense to me

## 2016-06-28 NOTE — Progress Notes (Addendum)
CRITICAL VALUE ALERT  Critical value received:  INR 4.68  Date of notification:  06/28/2016  Time of notification:  1127  Critical value read back: Yes  Nurse who received alert:  Cathe Mons  MD notified (1st page):  Rama, C  Time of first page:  1128  MD notified (2nd page): Rama, C  Time of second page: 1200  Responding MD:  Vilma Prader  Time MD responded:  [phone]

## 2016-06-28 NOTE — Progress Notes (Addendum)
PHARMACY - PHYSICIAN COMMUNICATION CRITICAL VALUE ALERT - BLOOD CULTURE IDENTIFICATION (BCID)  Results for orders placed or performed during the hospital encounter of 06/27/16  Blood Culture ID Panel (Reflexed) (Collected: 06/27/2016  3:38 PM)  Result Value Ref Range   Enterococcus species NOT DETECTED NOT DETECTED   Listeria monocytogenes NOT DETECTED NOT DETECTED   Staphylococcus species NOT DETECTED NOT DETECTED   Staphylococcus aureus NOT DETECTED NOT DETECTED   Streptococcus species NOT DETECTED NOT DETECTED   Streptococcus agalactiae NOT DETECTED NOT DETECTED   Streptococcus pneumoniae NOT DETECTED NOT DETECTED   Streptococcus pyogenes NOT DETECTED NOT DETECTED   Acinetobacter baumannii NOT DETECTED NOT DETECTED   Enterobacteriaceae species DETECTED (A) NOT DETECTED   Enterobacter cloacae complex NOT DETECTED NOT DETECTED   Escherichia coli DETECTED (A) NOT DETECTED   Klebsiella oxytoca NOT DETECTED NOT DETECTED   Klebsiella pneumoniae NOT DETECTED NOT DETECTED   Proteus species NOT DETECTED NOT DETECTED   Serratia marcescens NOT DETECTED NOT DETECTED   Carbapenem resistance NOT DETECTED NOT DETECTED   Haemophilus influenzae NOT DETECTED NOT DETECTED   Neisseria meningitidis NOT DETECTED NOT DETECTED   Pseudomonas aeruginosa NOT DETECTED NOT DETECTED   Candida albicans NOT DETECTED NOT DETECTED   Candida glabrata NOT DETECTED NOT DETECTED   Candida krusei NOT DETECTED NOT DETECTED   Candida parapsilosis NOT DETECTED NOT DETECTED   Candida tropicalis NOT DETECTED NOT DETECTED    Name of physician (or Provider) Contacted: Dr. Darnelle Catalan, paged at 0830, spoke with at [phone]  Changes to prescribed antibiotics required: Patient is afebrile, wbc 6.6, scr improved to 1.05, lactate improved. 3/4 bottles positive for E coli. Patient is currently not on antibiotics. Recommend starting ceftriaxone 2g IV every 24 hours. Will enter order per Dr. Renie Ora, PharmD PGY1 Pharmacy  Resident MS3 [phone] 06/28/2016 8:30 AM

## 2016-06-28 NOTE — Consult Note (Signed)
PULMONARY / CRITICAL CARE MEDICINE   Name: Gabriela Hernandez MRN: [id number] DOB: [birth year]    ADMISSION DATE:  06/27/2016 CONSULTATION DATE:  06/28/2016  REFERRING MD:  [facility]  CHIEF COMPLAINT:  Respiratory failure and septic shock  HISTORY OF PRESENT ILLNESS:   83 year old female with history of a dilated cardiomyopathy who presents with diarrhea, UTI and septic shock.  Patient was given 3.5L of NS and this AM SBP of 90-100 but significant increase in WOB and PCCM was consulted.  None productive cough.  Night sweats.  Burning with urination and diarrhea.  All other symptoms negative.  PAST MEDICAL HISTORY :  She  has a past medical history of Atrial fibrillation (HCC); Chronic systolic CHF (congestive heart failure) (HCC) (04/05/2012); Dilated cardiomyopathy (HCC); Dysphagia; Esophageal stricture; History of shingles; Hypertension; Hypothyroidism; and Lightheadedness.  PAST SURGICAL HISTORY: She  has a past surgical history that includes Cataract extraction; Vein ligation; Thyroidectomy; Foot surgery; Tonsillectomy; Appendectomy; and Cesarean section.  No Known Allergies  No current facility-administered medications on file prior to encounter.    Current Outpatient Prescriptions on File Prior to Encounter  Medication Sig  . carvedilol (COREG) 25 MG tablet TAKE 1/2 TABLET BY MOUTH TWICE DAILY WITH A MEAL  . furosemide (LASIX) 20 MG tablet Take 2 tablets (40 mg total) by mouth every morning and take 1 tablet (20 mg total) by mouth every evening.  Marland Kitchen levothyroxine (SYNTHROID, LEVOTHROID) 112 MCG tablet Take 112 mcg by mouth daily before breakfast.  . Multiple Vitamins-Minerals (MULTIVITAMIN WITH MINERALS) tablet Take 1 tablet by mouth daily.  . quinapril (ACCUPRIL) 20 MG tablet Take 0.5 tablets (10 mg total) by mouth daily.  Marland Kitchen warfarin (COUMADIN) 5 MG tablet Take 1 tablet (5 mg total) by mouth as directed. (Patient taking differently: Take 5 mg by mouth every morning. )    FAMILY  HISTORY:  Her indicated that her mother is deceased. She indicated that her father is deceased. She indicated that both of her sisters are deceased. She indicated that her brother is deceased. She indicated that her maternal grandmother is deceased. She indicated that her maternal grandfather is deceased. She indicated that her paternal grandmother is deceased. She indicated that her paternal grandfather is deceased.    SOCIAL HISTORY: She  reports that she has never smoked. She has never used smokeless tobacco. She reports that she does not drink alcohol or use drugs.  REVIEW OF SYSTEMS:   12 point ROS of system is negative other than above.  SUBJECTIVE:  SOB and feeling tired  VITAL SIGNS: BP (!) 102/56   Pulse 81   Temp 99.1 F (37.3 C)   Resp 20   Ht 5\' 6"  (1.676 m)   Wt 60.9 kg (134 lb 4.2 oz)   SpO2 95%   BMI 21.67 kg/m   HEMODYNAMICS:    VENTILATOR SETTINGS:    INTAKE / OUTPUT: I/O last 3 completed shifts: In: 3923.3 [I.V.:173.3; IV Piggyback:3750] Out: 300 [Urine:300]  PHYSICAL EXAMINATION: General:  Chronically ill appearing elderly woman, moderate respiratory distress Neuro:  Awake and alert, moving all ext to command HEENT:  [place]/AT, PERRL, EOM-I and MMM Cardiovascular:  Tachy, IRIR, Nl S1/S2, -M/R/G. Lungs:  Diffuse crackles Abdomen:  Soft, NT, ND and +BSS Musculoskeletal:  -edema and -tenderness Skin:  Intact  LABS:  BMET  Recent Labs Lab 06/28/16 0428 06/28/16 0750 06/28/16 1349  NA 129* 129* 129*  K 3.6 4.0 3.9  CL 96* 100* 96*  CO2 22  22 21*  BUN 32* 32* 31*  CREATININE 1.05* 1.03* 1.02*  GLUCOSE 99 99 147*    Electrolytes  Recent Labs Lab 06/27/16 1540  06/28/16 0428 06/28/16 0750 06/28/16 1349  CALCIUM  --   < > 8.3* 8.5* 8.6*  MG 1.7  --  2.2  --   --   < > = values in this interval not displayed.  CBC  Recent Labs Lab 06/27/16 1429 06/28/16 0428 06/28/16 0940  WBC 8.9 6.6  --   HGB 11.1* 10.2*  --   HCT 32.9* 30.4*   --   PLT 122* 82* 86*    Coag's  Recent Labs Lab 06/27/16 1540 06/28/16 0428 06/28/16 0940  APTT  --   --  61*  57*  INR 3.38 4.64* 4.68*    Sepsis Markers  Recent Labs Lab 06/27/16 1450 06/27/16 1936 06/27/16 1940 06/28/16 0940  LATICACIDVEN 2.52*  --  1.07 1.7  PROCALCITON  --  6.63  --  7.08    ABG  Recent Labs Lab 06/28/16 0942  PHART 7.408  PCO2ART 33.1  PO2ART 62.7*    Liver Enzymes  Recent Labs Lab 06/27/16 1540 06/28/16 0428  AST 52* 52*  ALT 34 35  ALKPHOS 50 45  BILITOT 2.9* 1.8*  ALBUMIN 3.0* 2.5*    Cardiac Enzymes  Recent Labs Lab 06/27/16 1936 06/27/16 2252 06/28/16 0428  TROPONINI 0.03* 0.03* 0.14*    Glucose  Recent Labs Lab 06/27/16 1521  GLUCAP 121*    Imaging Ct Abdomen Pelvis Wo Contrast  Result Date: 06/27/2016 CLINICAL DATA:  Hyperbilirubinemia EXAM: CT ABDOMEN AND PELVIS WITHOUT CONTRAST TECHNIQUE: Multidetector CT imaging of the abdomen and pelvis was performed following the standard protocol without IV contrast. COMPARISON:  Ultrasound from earlier in the same day FINDINGS: Lower chest: Mild atelectatic changes are noted in the left lung base. Pectus excavatum is seen. The cardiac shadow is enlarged and somewhat distorted by the pectus deformity. Hepatobiliary: The liver shows a small hypodensity consistent with cysts. This was not well appreciated on the recent ultrasound. The gallbladder is well distended with cholelithiasis. No significant pericholecystic fluid is seen. Pancreas: Unremarkable. No pancreatic ductal dilatation or surrounding inflammatory changes. Spleen: Normal in size without focal abnormality. Adrenals/Urinary Tract: The adrenal glands are within normal limits. The right kidney demonstrates a prominent extrarenal pelvis although no obstructive changes are seen. The left kidney is partially pelvic in location. No obstructive changes or calculi are seen. Stomach/Bowel: Diverticulosis of the colon is  noted without evidence of diverticulitis. The appendix is not well visualized consistent with the surgical history. Vascular/Lymphatic: Aortic atherosclerosis. No enlarged abdominal or pelvic lymph nodes. Reproductive: Uterus and bilateral adnexa are unremarkable. Other: No abdominal wall hernia or abnormality. No abdominopelvic ascites. Musculoskeletal: Degenerative changes of lumbar spine are seen. IMPRESSION: Cholelithiasis without complicating factors. No biliary dilatation is seen. Hepatic cyst. No acute abnormality noted. Electronically Signed   By: Alcide Clever M.D.   On: 06/27/2016 18:32   Dg Chest 2 View  Result Date: 06/27/2016 CLINICAL DATA:  Hypotension. EXAM: CHEST  2 VIEW COMPARISON:  None 07/07/2013 FINDINGS: Marked cardiomegaly. No confluent airspace opacities, effusions or edema. No acute bony abnormality. IMPRESSION: Marked cardiomegaly.  No active disease. Electronically Signed   By: Charlett Nose M.D.   On: 06/27/2016 16:17   Ct Head Wo Contrast  Result Date: 06/27/2016 CLINICAL DATA:  Fall, posterior head injury EXAM: CT HEAD WITHOUT CONTRAST TECHNIQUE: Contiguous axial images were obtained from the  base of the skull through the vertex without intravenous contrast. COMPARISON:  03/08/2015 FINDINGS: Brain: No evidence of acute infarction, hemorrhage, hydrocephalus, extra-axial collection or mass lesion/mass effect. Global cortical atrophy. Subcortical white matter and periventricular small vessel ischemic changes. Vascular: No hyperdense vessel or unexpected calcification. Skull: Normal. Negative for fracture or focal lesion. Sinuses/Orbits: Partial opacification of the left frontal sinus. Visualized paranasal sinuses the mastoid air cells are otherwise clear. Other: None. IMPRESSION: No evidence of acute intracranial abnormality. Atrophy with small vessel ischemic changes. Electronically Signed   By: Charline Bills M.D.   On: 06/27/2016 16:47   US Abdomen Limited Ruq  Result Date:  06/27/2016 CLINICAL DATA:  Elevated bilirubin EXAM: US ABDOMEN LIMITED - RIGHT UPPER QUADRANT COMPARISON:  None. FINDINGS: Gallbladder: Gallbladder is well distended with multiple stones. Mild gallbladder wall thickening is noted. No pericholecystic fluid is seen. Common bile duct: Diameter: 3 mm. Liver: Mildly heterogeneous although no focal lesion is seen. No definitive biliary obstruction is noted. IMPRESSION: Cholelithiasis with mild wall thickening. No other focal abnormality is seen. Electronically Signed   By: Alcide Clever M.D.   On: 06/27/2016 18:19     STUDIES:  CXR pulmonary edema  CULTURES: Blood = E coli  ANTIBIOTICS: Rocephin  LINES/TUBES: PIV  DISCUSSION: 83 year old female with extensive cardiac history presenting with e coli bacteremia from UTI with septic shock.  IVF given but with cardiomyopathy developed pulmonary edema and respiratory failure.  I had an extensive discussion with patient and daughter.  Gave option of transfer to ICU with placement of a central line and starting pressors vs low dose lasix and BiPAP if needs be.  I recommended lasix and BIPAP since patient has had VT and pressors will be very arrhythmogenic.  Given that the first rule of medicine is do no harm I can not in good conscience recommend a potentially harmful intervention.  Will make a full DNR and proceed with treatment as above.  Discussed with Dr. Darnelle Catalan.  PCCM will sign off, please call back if needed  The patient is critically ill with multiple organ systems failure and requires high complexity decision making for assessment and support, frequent evaluation and titration of therapies, application of advanced monitoring technologies and extensive interpretation of multiple databases.   Critical Care Time devoted to patient care services described in this note is  35  Minutes. This time reflects time of care of this signee Dr Koren Bound. This critical care time does not reflect procedure time, or  teaching time or supervisory time of PA/NP/Med student/Med Resident etc but could involve care discussion time.  Alyson Reedy, M.D. [facility] Pulmonary/Critical Care Medicine. Pager: [phone]. After hours pager: [phone].  06/28/2016, 2:33 PM

## 2016-06-28 NOTE — Progress Notes (Signed)
BP 66/40 after 250cc NS bolus.  Lab called with critical INR of 4.64.  Txt paged Dr. Emmit Pomfret, awaiting cb with orders.  Will continue to monitor.

## 2016-06-28 NOTE — Progress Notes (Signed)
ANTICOAGULATION CONSULT NOTE - Initial Consult  Pharmacy Consult for Warfarin Indication: atrial fibrillation  Assessment: 59 yoF presented to the ED with weakness and a fall. She is on chronic coumadin for history of afib (pta dose 5 mg daily). INR was supratherapeutic (3.38) on admission. Last dose was 2/10. Head CT was negative for bleeds. Today the INR is rising and remains supratherapeutic (4.64), hemoglobin and platelets have slightly dropped to 10.2 and 82. No bleeding noted per RN.   Goal of Therapy:  INR 2-3 Monitor platelets by anticoagulation protocol: Yes   Plan:  Hold warfarin tonight Daily INR Monitor for bleeding  No Known Allergies  Patient Measurements: Height: 5\' 6"  (167.6 cm) Weight: 134 lb 4.2 oz (60.9 kg) IBW/kg (Calculated) : 59.3  Vital Signs: Temp: 98.1 F (36.7 C) (02/11 0409) Temp Source: Oral (02/11 0409) BP: 92/55 (02/11 0700) Pulse Rate: 52 (02/11 0700)  Labs:  Recent Labs  06/27/16 1429 06/27/16 1540 06/27/16 1936 06/27/16 2252 06/28/16 0428  HGB 11.1*  --   --   --  10.2*  HCT 32.9*  --   --   --  30.4*  PLT 122*  --   --   --  82*  LABPROT  --  35.0*  --   --  45.4*  INR  --  3.38  --   --  4.64*  CREATININE 1.28*  --  1.06* 1.02* 1.05*  CKTOTAL  --  506*  --   --  654*  TROPONINI  --   --  0.03* 0.03* 0.14*    Estimated Creatinine Clearance: 35.3 mL/min (by C-G formula based on SCr of 1.05 mg/dL (H)).   Medical History: Past Medical History:  Diagnosis Date  . Atrial fibrillation (HCC)   . Chronic systolic CHF (congestive heart failure) (HCC) 04/05/2012  . Dilated cardiomyopathy (HCC)    with ejection fraction of 30-35%  . Dysphagia   . Esophageal stricture   . History of shingles   . Hypertension   . Hypothyroidism   . Lightheadedness     Allie Bossier, PharmD PGY1 Pharmacy Resident MS3 [phone] 06/28/2016 8:48 AM

## 2016-06-29 DIAGNOSIS — E871 Hypo-osmolality and hyponatremia: Secondary | ICD-10-CM

## 2016-06-29 LAB — GLUCOSE, CAPILLARY: GLUCOSE-CAPILLARY: 221 mg/dL — AB (ref 65–99)

## 2016-06-29 LAB — PROTIME-INR
INR: 4.29 — AB
PROTHROMBIN TIME: 42.3 s — AB (ref 11.4–15.2)

## 2016-06-29 MED ORDER — ENSURE ENLIVE PO LIQD
237.0000 mL | Freq: Two times a day (BID) | ORAL | Status: DC
  Administered 2016-06-29 – 2016-07-03 (×8): 237 mL via ORAL

## 2016-06-29 MED ORDER — ORAL CARE MOUTH RINSE
15.0000 mL | Freq: Two times a day (BID) | OROMUCOSAL | Status: DC
  Administered 2016-06-29 – 2016-07-03 (×5): 15 mL via OROMUCOSAL

## 2016-06-29 MED ORDER — LEVOTHYROXINE SODIUM 112 MCG PO TABS
112.0000 ug | ORAL_TABLET | Freq: Every day | ORAL | Status: DC
  Administered 2016-06-30 – 2016-07-03 (×4): 112 ug via ORAL
  Filled 2016-06-29 (×4): qty 1

## 2016-06-29 NOTE — Progress Notes (Signed)
Initial Nutrition Assessment  DOCUMENTATION CODES:   Non-severe (moderate) malnutrition in context of acute illness/injury  INTERVENTION:    Ensure Enlive PO BID, each supplement provides 350 kcal and 20 grams of protein  NUTRITION DIAGNOSIS:   Malnutrition related to acute illness as evidenced by moderate depletion of body fat, moderate depletions of muscle mass, energy intake < or equal to 50% for > or equal to 5 days.  GOAL:   Patient will meet greater than or equal to 90% of their needs  MONITOR:   PO intake, Supplement acceptance, Skin, Labs, I & O's  REASON FOR ASSESSMENT:   Consult Assessment of nutrition requirement/status  ASSESSMENT:   83 y.o. female with PMH of HTN, atrial fibrillation, and CHF who presented to the ED on 2/10 with complaints of generalized weakness and diarrhea for 2-3 days.    Patient reports that she has been eating poorly recently. She c/o poor appetite with recent diarrhea. Minimal intake for the past week. She said she is having difficulty breathing. She tried a Chiropodist and liked it.  Nutrition-Focused physical exam completed. Findings are mild-moderate fat depletion, mild-moderate-severe muscle depletion, and no edema.  Labs reviewed: sodium 129. CBG's: 121-221 Medications reviewed.  Diet Order:  Diet Heart Room service appropriate? Yes; Fluid consistency: Thin  Skin:  Reviewed, no issues  Last BM:  2/11  Height:   Ht Readings from Last 1 Encounters:  06/27/16 5\' 6"  (1.676 m)    Weight:   Wt Readings from Last 1 Encounters:  06/28/16 134 lb 4.2 oz (60.9 kg)    Ideal Body Weight:  59.1 kg  BMI:  Body mass index is 21.67 kg/m.  Estimated Nutritional Needs:   Kcal:  1400-1600  Protein:  70-85 gm  Fluid:  1.5 L  EDUCATION NEEDS:   Education needs addressed; discussed ways to increase oral intake of protein and calories.  Joaquin Courts, RD, LDN, CNSC Pager [phone] After Hours Pager [phone]

## 2016-06-29 NOTE — Progress Notes (Signed)
Patient arrived at 1835 from [facility]east via bed accompanied by two nurses and her daughter.  Admitted to room 24.  Oriented to room, call light use, meal times and unit.  Patient denies any discomfort at this time.  Skin intact, warm and dry.  Daughter at bedside.  Patient resting in bed at this time.  Will monitor. Elnita Maxwell, RN

## 2016-06-29 NOTE — Progress Notes (Signed)
Report called pt to transfer to 3E24 via bed with belongings and daugther.

## 2016-06-29 NOTE — Progress Notes (Signed)
Progress Note    Rovena Hearld Boschee  ZOX:[id number] DOB: [birth year]  DOA: 06/27/2016 PCP: Lupe Carney, MD    Brief Narrative:   Chief complaint: Follow-up diarrhea and weakness  Gabriela Hernandez is an 83 y.o. female with a PMH of atrial fibrillation on chronic Coumadin, dilated nonischemic cardiomyopathy with preserved EF per echo 05/20/16, hypothyroidism and hypertension who was admitted 06/27/16 with a chief complaint of diarrhea and weakness with reports of a fall. Upon initial evaluation in the ED, the patient was found to be hypotensive.  Assessment/Plan:   Principal Problem:   Sepsis secondary to Escherichia coli bacteremia/hypotension/UTI Blood pressure 71/37 on admission. Hypotension initially thought to be from hypovolemia. Fluid volume resuscitated with improved blood pressure overnight. 3/4 blood cultures growing Escherichia coli, so placed on ceftriaxone. Sepsis order set placed.  Critical care team consulted due to high risk of deterioration. Patient is now DO NOT RESUSCITATE. She responded well to diuretic therapy, and is less dyspneic today. Blood pressure remains low but mentating well. Lactate has cleared.We'll transfer to telemetry.  Active Problems:   Elevated CK/rhabdomyolysis secondary to fall  Elevated CK levels likely related to rhabdomyolysis from fall. Unable to hydrate secondary to heart failure.    Demand ischemia/troponin elevation/acute on chronic systolic CHF Suspect troponin elevation is secondary hypotension-induced demand ischemia. 12-lead EKG done on admission (personally reviewed) showed atrial fibrillation, And repeat 12-lead EKG showed aberrantly conducted complexes. Last 2-D echo done 05/20/16 and showed an EF of 55-60 percent. There was also a small pericardial effusion identified, but no hemodynamic compromise. Unfortunately, ongoing hypotension limits ability to diurese.     Thrombocytopenia Likely related to sepsis. DIC panel negative for  schistocytes and fibrinogen levels normal. Treat underlying infection and follow platelets.    Acute kidney injury Baseline creatinine is 0.5. Current creatinine elevated over usual baseline values. Likely prerenal from hypotension.     Atrial fibrillation (HCC) Rate controlled on chronic Coumadin. INR supratherapeutic on admission. Coumadin per pharmacy.    Diarrhea No reports of diarrhea.    Hyponatremia Likely from CHF physiology, monitor.    Fall PT evaluation when medically stable.    Hypothyroidism Continue Synthroid. TSH WNL.    Hyperbilirubinemia Bilirubin mildly elevated. CT of the abdomen showed cholelithiasis without complicating factors and no biliary dilatation. RUQ ultrasound also showed cholelithiasis with mild wall thickening but no other focal abnormalities. Bilirubin improving with hydration, continue to monitor. No reports of abdominal pain.    Severe protein calorie malnutrition/underweight Body mass index is 21.67 kg/m. Dietitian consultation.   Family Communication/Anticipated D/C date and plan/Code Status   DVT prophylaxis: Coumadin ordered. Code Status: Full Code.  Family Communication: Daughter at the bedside 06/28/16. Disposition Plan: To be determined.   Medical Consultants:    PCCM  Cardiology   Procedures:    None  Anti-Infectives:    Rocephin 06/28/16--->  Subjective:   The patient Feels a bit better today, still short of breath, but improved. Denies diarrhea, nausea, or vomiting.  Objective:    Vitals:   06/29/16 0400 06/29/16 0500 06/29/16 0600 06/29/16 0700  BP: (!) 87/53 (!) 90/58 (!) 89/46 (!) 90/56  Pulse: 71 65 66 65  Resp: 18 17 17 15   Temp:      TempSrc:      SpO2: 98% 99% 99% 100%  Weight:      Height:        Intake/Output Summary (Last 24 hours) at 06/29/16 0840 Last data filed at 06/28/16  1711  Gross per 24 hour  Intake           134.33 ml  Output              350 ml  Net          -215.67 ml    Filed Weights   06/27/16 1543 06/27/16 2004 06/28/16 0300  Weight: 56.7 kg (125 lb) 59.3 kg (130 lb 11.7 oz) 60.9 kg (134 lb 4.2 oz)    Exam: General exam: Appears calm.Looks better than labs/clinical scenario would suggest. Respiratory system: A few wheezes, bilateral crackles, labored breathing with tachypnea. Cardiovascular system: S1 & S2 heard, RRR. + JVD,  rubs, gallops or clicks. No murmurs. Gastrointestinal system: Abdomen is nondistended, soft and nontender. No organomegaly or masses felt. Normal bowel sounds heard. Central nervous system: Alert and oriented. No focal neurological deficits. Extremities: No clubbing, or cyanosis. 3+ edema. Skin: Venous stasis dermatitis. Psychiatry: Judgement and insight appear normal. Mood & affect appropriate.   Data Reviewed:   I have personally reviewed following labs and imaging studies:  Labs: Basic Metabolic Panel:  Recent Labs Lab 06/27/16 1540 06/27/16 1936 06/27/16 2252 06/28/16 0428 06/28/16 0750 06/28/16 1349  NA  --  128* 131* 129* 129* 129*  K  --  3.4* 3.8 3.6 4.0 3.9  CL  --  98* 99* 96* 100* 96*  CO2  --  20* 21* 22 22 21*  GLUCOSE  --  105* 126* 99 99 147*  BUN  --  31* 29* 32* 32* 31*  CREATININE  --  1.06* 1.02* 1.05* 1.03* 1.02*  CALCIUM  --  7.9* 8.3* 8.3* 8.5* 8.6*  MG 1.7  --   --  2.2  --   --    GFR Estimated Creatinine Clearance: 36.4 mL/min (by C-G formula based on SCr of 1.02 mg/dL (H)). Liver Function Tests:  Recent Labs Lab 06/27/16 1540 06/28/16 0428  AST 52* 52*  ALT 34 35  ALKPHOS 50 45  BILITOT 2.9* 1.8*  PROT 5.2* 4.7*  ALBUMIN 3.0* 2.5*   No results for input(s): LIPASE, AMYLASE in the last 168 hours. No results for input(s): AMMONIA in the last 168 hours. Coagulation profile  Recent Labs Lab 06/27/16 1540 06/28/16 0428 06/28/16 0940 06/29/16 0318  INR 3.38 4.64* 4.68* 4.29*    CBC:  Recent Labs Lab 06/27/16 1429 06/28/16 0428 06/28/16 0940  WBC 8.9 6.6  --    HGB 11.1* 10.2*  --   HCT 32.9* 30.4*  --   MCV 90.1 89.4  --   PLT 122* 82* 86*   Cardiac Enzymes:  Recent Labs Lab 06/27/16 1540 06/27/16 1936 06/27/16 2252 06/28/16 0428  CKTOTAL 506*  --   --  654*  TROPONINI  --  0.03* 0.03* 0.14*   BNP (last 3 results) No results for input(s): PROBNP in the last 8760 hours. CBG:  Recent Labs Lab 06/27/16 1521  GLUCAP 121*   D-Dimer:  Recent Labs  06/28/16 0940  DDIMER 2.09*   Thyroid function studies:  Recent Labs  06/27/16 1540  TSH 1.168   Sepsis Labs:  Recent Labs Lab 06/27/16 1429 06/27/16 1450 06/27/16 1936 06/27/16 1940 06/28/16 0428 06/28/16 0940 06/28/16 1349  PROCALCITON  --   --  6.63  --   --  7.08  --   WBC 8.9  --   --   --  6.6  --   --   LATICACIDVEN  --  2.52*  --  1.07  --  1.7 1.5    Microbiology Recent Results (from the past 240 hour(s))  Blood culture (routine x 2)     Status: None (Preliminary result)   Collection Time: 06/27/16  3:25 PM  Result Value Ref Range Status   Specimen Description BLOOD LEFT ANTECUBITAL  Final   Special Requests BOTTLES DRAWN AEROBIC ONLY 5CC  Final   Culture  Setup Time   Final    GRAM NEGATIVE RODS IN BOTH AEROBIC AND ANAEROBIC BOTTLES CRITICAL VALUE NOTED.  VALUE IS CONSISTENT WITH PREVIOUSLY REPORTED AND CALLED VALUE.    Culture GRAM NEGATIVE RODS  Final   Report Status PENDING  Incomplete  Blood culture (routine x 2)     Status: None (Preliminary result)   Collection Time: 06/27/16  3:38 PM  Result Value Ref Range Status   Specimen Description BLOOD RIGHT ANTECUBITAL  Final   Special Requests BOTTLES DRAWN AEROBIC AND ANAEROBIC 5CC  Final   Culture  Setup Time   Final    GRAM NEGATIVE RODS IN BOTH AEROBIC AND ANAEROBIC BOTTLES Organism ID to follow CRITICAL RESULT CALLED TO, READ BACK BY AND VERIFIED WITH: A ANDERSON,PHARMD AT 0821 06/28/16 BY L BENFIELD    Culture GRAM NEGATIVE RODS  Final   Report Status PENDING  Incomplete  Blood Culture ID  Panel (Reflexed)     Status: Abnormal   Collection Time: 06/27/16  3:38 PM  Result Value Ref Range Status   Enterococcus species NOT DETECTED NOT DETECTED Final   Listeria monocytogenes NOT DETECTED NOT DETECTED Final   Staphylococcus species NOT DETECTED NOT DETECTED Final   Staphylococcus aureus NOT DETECTED NOT DETECTED Final   Streptococcus species NOT DETECTED NOT DETECTED Final   Streptococcus agalactiae NOT DETECTED NOT DETECTED Final   Streptococcus pneumoniae NOT DETECTED NOT DETECTED Final   Streptococcus pyogenes NOT DETECTED NOT DETECTED Final   Acinetobacter baumannii NOT DETECTED NOT DETECTED Final   Enterobacteriaceae species DETECTED (A) NOT DETECTED Final    Comment: Enterobacteriaceae represent a large family of gram-negative bacteria, not a single organism. CRITICAL RESULT CALLED TO, READ BACK BY AND VERIFIED WITH: A ANDERSON,PHARMD AT 0821 06/28/16 BY L BENFIELD    Enterobacter cloacae complex NOT DETECTED NOT DETECTED Final   Escherichia coli DETECTED (A) NOT DETECTED Final    Comment: CRITICAL RESULT CALLED TO, READ BACK BY AND VERIFIED WITH: A ANDERSON,PHARMD AT 0821 06/28/16 BY L BENFIELD    Klebsiella oxytoca NOT DETECTED NOT DETECTED Final   Klebsiella pneumoniae NOT DETECTED NOT DETECTED Final   Proteus species NOT DETECTED NOT DETECTED Final   Serratia marcescens NOT DETECTED NOT DETECTED Final   Carbapenem resistance NOT DETECTED NOT DETECTED Final   Haemophilus influenzae NOT DETECTED NOT DETECTED Final   Neisseria meningitidis NOT DETECTED NOT DETECTED Final   Pseudomonas aeruginosa NOT DETECTED NOT DETECTED Final   Candida albicans NOT DETECTED NOT DETECTED Final   Candida glabrata NOT DETECTED NOT DETECTED Final   Candida krusei NOT DETECTED NOT DETECTED Final   Candida parapsilosis NOT DETECTED NOT DETECTED Final   Candida tropicalis NOT DETECTED NOT DETECTED Final  MRSA PCR Screening     Status: None   Collection Time: 06/27/16  8:28 PM  Result  Value Ref Range Status   MRSA by PCR NEGATIVE NEGATIVE Final    Comment:        The GeneXpert MRSA Assay (FDA approved for NASAL specimens only), is one component of a comprehensive MRSA colonization surveillance program. It is  not intended to diagnose MRSA infection nor to guide or monitor treatment for MRSA infections.   Gastrointestinal Panel by PCR , Stool     Status: None   Collection Time: 06/27/16  8:33 PM  Result Value Ref Range Status   Campylobacter species NOT DETECTED NOT DETECTED Final   Plesimonas shigelloides NOT DETECTED NOT DETECTED Final   Salmonella species NOT DETECTED NOT DETECTED Final   Yersinia enterocolitica NOT DETECTED NOT DETECTED Final   Vibrio species NOT DETECTED NOT DETECTED Final   Vibrio cholerae NOT DETECTED NOT DETECTED Final   Enteroaggregative E coli (EAEC) NOT DETECTED NOT DETECTED Final   Enteropathogenic E coli (EPEC) NOT DETECTED NOT DETECTED Final   Enterotoxigenic E coli (ETEC) NOT DETECTED NOT DETECTED Final   Shiga like toxin producing E coli (STEC) NOT DETECTED NOT DETECTED Final   Shigella/Enteroinvasive E coli (EIEC) NOT DETECTED NOT DETECTED Final   Cryptosporidium NOT DETECTED NOT DETECTED Final   Cyclospora cayetanensis NOT DETECTED NOT DETECTED Final   Entamoeba histolytica NOT DETECTED NOT DETECTED Final   Giardia lamblia NOT DETECTED NOT DETECTED Final   Adenovirus F40/41 NOT DETECTED NOT DETECTED Final   Astrovirus NOT DETECTED NOT DETECTED Final   Norovirus GI/GII NOT DETECTED NOT DETECTED Final   Rotavirus A NOT DETECTED NOT DETECTED Final   Sapovirus (I, II, IV, and V) NOT DETECTED NOT DETECTED Final  C difficile quick scan w PCR reflex     Status: None   Collection Time: 06/27/16  8:33 PM  Result Value Ref Range Status   C Diff antigen NEGATIVE NEGATIVE Final   C Diff toxin NEGATIVE NEGATIVE Final   C Diff interpretation No C. difficile detected.  Final    Radiology: Ct Abdomen Pelvis Wo Contrast  Result Date:  06/27/2016 CLINICAL DATA:  Hyperbilirubinemia EXAM: CT ABDOMEN AND PELVIS WITHOUT CONTRAST TECHNIQUE: Multidetector CT imaging of the abdomen and pelvis was performed following the standard protocol without IV contrast. COMPARISON:  Ultrasound from earlier in the same day FINDINGS: Lower chest: Mild atelectatic changes are noted in the left lung base. Pectus excavatum is seen. The cardiac shadow is enlarged and somewhat distorted by the pectus deformity. Hepatobiliary: The liver shows a small hypodensity consistent with cysts. This was not well appreciated on the recent ultrasound. The gallbladder is well distended with cholelithiasis. No significant pericholecystic fluid is seen. Pancreas: Unremarkable. No pancreatic ductal dilatation or surrounding inflammatory changes. Spleen: Normal in size without focal abnormality. Adrenals/Urinary Tract: The adrenal glands are within normal limits. The right kidney demonstrates a prominent extrarenal pelvis although no obstructive changes are seen. The left kidney is partially pelvic in location. No obstructive changes or calculi are seen. Stomach/Bowel: Diverticulosis of the colon is noted without evidence of diverticulitis. The appendix is not well visualized consistent with the surgical history. Vascular/Lymphatic: Aortic atherosclerosis. No enlarged abdominal or pelvic lymph nodes. Reproductive: Uterus and bilateral adnexa are unremarkable. Other: No abdominal wall hernia or abnormality. No abdominopelvic ascites. Musculoskeletal: Degenerative changes of lumbar spine are seen. IMPRESSION: Cholelithiasis without complicating factors. No biliary dilatation is seen. Hepatic cyst. No acute abnormality noted. Electronically Signed   By: Alcide Clever M.D.   On: 06/27/2016 18:32   Dg Chest 2 View  Result Date: 06/27/2016 CLINICAL DATA:  Hypotension. EXAM: CHEST  2 VIEW COMPARISON:  None 07/07/2013 FINDINGS: Marked cardiomegaly. No confluent airspace opacities, effusions or  edema. No acute bony abnormality. IMPRESSION: Marked cardiomegaly.  No active disease. Electronically Signed   By: Caryn Bee  [place] M.D.   On: 06/27/2016 16:17   Ct Head Wo Contrast  Result Date: 06/27/2016 CLINICAL DATA:  Fall, posterior head injury EXAM: CT HEAD WITHOUT CONTRAST TECHNIQUE: Contiguous axial images were obtained from the base of the skull through the vertex without intravenous contrast. COMPARISON:  03/08/2015 FINDINGS: Brain: No evidence of acute infarction, hemorrhage, hydrocephalus, extra-axial collection or mass lesion/mass effect. Global cortical atrophy. Subcortical white matter and periventricular small vessel ischemic changes. Vascular: No hyperdense vessel or unexpected calcification. Skull: Normal. Negative for fracture or focal lesion. Sinuses/Orbits: Partial opacification of the left frontal sinus. Visualized paranasal sinuses the mastoid air cells are otherwise clear. Other: None. IMPRESSION: No evidence of acute intracranial abnormality. Atrophy with small vessel ischemic changes. Electronically Signed   By: Charline Bills M.D.   On: 06/27/2016 16:47   Dg Chest Port 1 View  Result Date: 06/28/2016 CLINICAL DATA:  Dyspnea EXAM: PORTABLE CHEST 1 VIEW COMPARISON:  06/27/2016 FINDINGS: Cardiomegaly.  No frank interstitial edema. Left lung base is obscured.  No definite pleural effusion. No pneumothorax. IMPRESSION: Cardiomegaly.  No frank interstitial edema. Electronically Signed   By: Charline Bills M.D.   On: 06/28/2016 17:49   US Abdomen Limited Ruq  Result Date: 06/27/2016 CLINICAL DATA:  Elevated bilirubin EXAM: US ABDOMEN LIMITED - RIGHT UPPER QUADRANT COMPARISON:  None. FINDINGS: Gallbladder: Gallbladder is well distended with multiple stones. Mild gallbladder wall thickening is noted. No pericholecystic fluid is seen. Common bile duct: Diameter: 3 mm. Liver: Mildly heterogeneous although no focal lesion is seen. No definitive biliary obstruction is noted. IMPRESSION:  Cholelithiasis with mild wall thickening. No other focal abnormality is seen. Electronically Signed   By: Alcide Clever M.D.   On: 06/27/2016 18:19    Medications:   . carvedilol  25 mg Oral BID WC  . cefTRIAXone (ROCEPHIN)  IV  2 g Intravenous Q24H  . levothyroxine  112 mcg Oral QAC breakfast  . mouth rinse  15 mL Mouth Rinse BID  . sodium chloride flush  3 mL Intravenous Q12H  . Warfarin - Pharmacist Dosing Inpatient   Does not apply q1800   Continuous Infusions: . sodium chloride 10 mL/hr at 06/28/16 0845    Medical decision making is of high complexity and this patient is at high risk of deterioration, therefore this is a level 3 visit.  (> 4 problem points, 2 data points, high risk)   LOS: 1 day   Duaine Radin  [facility] Pager [phone]. If unable to reach me by pager, please call my cell phone at [phone].  *Please refer to [email or web address], password [email or web address] to get updated schedule on who will round on this patient, as hospitalists switch teams weekly. If 7PM-7AM, please contact night-coverage at [email or web address], password [email or web address] for any overnight needs.  06/29/2016, 8:40 AM

## 2016-06-29 NOTE — Progress Notes (Signed)
ANTICOAGULATION CONSULT NOTE - Initial Consult  Pharmacy Consult for Warfarin Indication: atrial fibrillation  Assessment: 56 yoF presented to the ED with weakness and a fall. She is on chronic coumadin 5mg  daily PTA for history of Afib. INR was supratherapeutic (3.38) on admission. Head CT negative for bleed. INR continued to rise until peaked at 4.68 on 2/11. Now INR down to 4.29 today. Hgb down to 10.2, plts low at 86. No s/s of bleed.  Goal of Therapy:  INR 2-3 Monitor platelets by anticoagulation protocol: Yes   Plan:  No warfarin tonight Monitor daily INR, CBC, s/s of bleed  No Known Allergies  Patient Measurements: Height: 5\' 6"  (167.6 cm) Weight: 134 lb 4.2 oz (60.9 kg) IBW/kg (Calculated) : 59.3  Vital Signs: Temp: 99.3 F (37.4 C) (02/12 0700) Temp Source: Oral (02/12 0700) BP: 90/56 (02/12 0700) Pulse Rate: 65 (02/12 0700)  Labs:  Recent Labs  06/27/16 1429  06/27/16 1540 06/27/16 1936 06/27/16 2252 06/28/16 0428 06/28/16 0750 06/28/16 0940 06/28/16 1349 06/29/16 0318  HGB 11.1*  --   --   --   --  10.2*  --   --   --   --   HCT 32.9*  --   --   --   --  30.4*  --   --   --   --   PLT 122*  --   --   --   --  82*  --  86*  --   --   APTT  --   --   --   --   --   --   --  61*  57*  --   --   LABPROT  --   < > 35.0*  --   --  45.4*  --  45.3*  --  42.3*  INR  --   < > 3.38  --   --  4.64*  --  4.68*  --  4.29*  CREATININE 1.28*  --   --  1.06* 1.02* 1.05* 1.03*  --  1.02*  --   CKTOTAL  --   --  506*  --   --  654*  --   --   --   --   TROPONINI  --   --   --  0.03* 0.03* 0.14*  --   --   --   --   < > = values in this interval not displayed.  Estimated Creatinine Clearance: 36.4 mL/min (by C-G formula based on SCr of 1.02 mg/dL (H)).   Medical History: Past Medical History:  Diagnosis Date  . Atrial fibrillation (HCC)   . Chronic systolic CHF (congestive heart failure) (HCC) 04/05/2012  . Dilated cardiomyopathy (HCC)    with ejection fraction  of 30-35%  . Dysphagia   . Esophageal stricture   . History of shingles   . Hypertension   . Hypothyroidism   . Lightheadedness     Enzo Bi, PharmD, BCPS Clinical Pharmacist Pager [phone] 06/29/2016 9:56 AM

## 2016-06-29 NOTE — Progress Notes (Signed)
Progress Note  Patient Name: Gabriela Hernandez Date of Encounter: 06/29/2016  Primary Cardiologist: Scot Shiraishi [place] MD  Subjective   Complains of SOB. Feels weak.  Inpatient Medications    Scheduled Meds: . carvedilol  25 mg Oral BID WC  . cefTRIAXone (ROCEPHIN)  IV  2 g Intravenous Q24H  . levothyroxine  112 mcg Oral QAC breakfast  . mouth rinse  15 mL Mouth Rinse BID  . sodium chloride flush  3 mL Intravenous Q12H  . Warfarin - Pharmacist Dosing Inpatient   Does not apply q1800   Continuous Infusions: . sodium chloride 10 mL/hr at 06/28/16 0845   PRN Meds: acetaminophen **OR** acetaminophen, ondansetron **OR** ondansetron (ZOFRAN) IV, polyethylene glycol, traZODone   Vital Signs    Vitals:   06/29/16 0400 06/29/16 0500 06/29/16 0600 06/29/16 0700  BP: (!) 87/53 (!) 90/58 (!) 89/46 (!) 90/56  Pulse: 71 65 66 65  Resp: 18 17 17 15   Temp:      TempSrc:      SpO2: 98% 99% 99% 100%  Weight:      Height:        Intake/Output Summary (Last 24 hours) at 06/29/16 0838 Last data filed at 06/28/16 1711  Gross per 24 hour  Intake           134.33 ml  Output              350 ml  Net          -215.67 ml   Filed Weights   06/27/16 1543 06/27/16 2004 06/28/16 0300  Weight: 125 lb (56.7 kg) 130 lb 11.7 oz (59.3 kg) 134 lb 4.2 oz (60.9 kg)    Telemetry    Afib with controlled VR- no VT - Personally Reviewed  ECG    Afib, NSIVCD - Personally Reviewed  Physical Exam   GEN: Ederly, frail WF.   Neck: ++ JVD Cardiac: IRRR, no murmurs, rubs, or gallops. 1+ bilateral edema. Respiratory: Clear to auscultation bilaterally. Diminished BS in bases. GI: Soft, nontender, non-distended  MS: No edema; No deformity. Neuro:  Nonfocal  Psych: Normal affect   Labs    Chemistry Recent Labs Lab 06/27/16 1540  06/28/16 0428 06/28/16 0750 06/28/16 1349  NA  --   < > 129* 129* 129*  K  --   < > 3.6 4.0 3.9  CL  --   < > 96* 100* 96*  CO2  --   < > 22 22 21*  GLUCOSE  --    < > 99 99 147*  BUN  --   < > 32* 32* 31*  CREATININE  --   < > 1.05* 1.03* 1.02*  CALCIUM  --   < > 8.3* 8.5* 8.6*  PROT 5.2*  --  4.7*  --   --   ALBUMIN 3.0*  --  2.5*  --   --   AST 52*  --  52*  --   --   ALT 34  --  35  --   --   ALKPHOS 50  --  45  --   --   BILITOT 2.9*  --  1.8*  --   --   GFRNONAA  --   < > 46* 47* 48*  GFRAA  --   < > 54* 55* 56*  ANIONGAP  --   < > 11 7 12   < > = values in this interval not displayed.   Hematology Recent Labs  Lab 06/27/16 1429 06/28/16 0428 06/28/16 0940  WBC 8.9 6.6  --   RBC 3.65* 3.40*  --   HGB 11.1* 10.2*  --   HCT 32.9* 30.4*  --   MCV 90.1 89.4  --   MCH 30.4 30.0  --   MCHC 33.7 33.6  --   RDW 17.7* 17.9*  --   PLT 122* 82* 86*    Cardiac Enzymes Recent Labs Lab 06/27/16 1936 06/27/16 2252 06/28/16 0428  TROPONINI 0.03* 0.03* 0.14*    Recent Labs Lab 06/27/16 1449  TROPIPOC 0.06     BNPNo results for input(s): BNP, PROBNP in the last 168 hours.   DDimer  Recent Labs Lab 06/28/16 0940  DDIMER 2.09*     Radiology    Ct Abdomen Pelvis Wo Contrast  Result Date: 06/27/2016 CLINICAL DATA:  Hyperbilirubinemia EXAM: CT ABDOMEN AND PELVIS WITHOUT CONTRAST TECHNIQUE: Multidetector CT imaging of the abdomen and pelvis was performed following the standard protocol without IV contrast. COMPARISON:  Ultrasound from earlier in the same day FINDINGS: Lower chest: Mild atelectatic changes are noted in the left lung base. Pectus excavatum is seen. The cardiac shadow is enlarged and somewhat distorted by the pectus deformity. Hepatobiliary: The liver shows a small hypodensity consistent with cysts. This was not well appreciated on the recent ultrasound. The gallbladder is well distended with cholelithiasis. No significant pericholecystic fluid is seen. Pancreas: Unremarkable. No pancreatic ductal dilatation or surrounding inflammatory changes. Spleen: Normal in size without focal abnormality. Adrenals/Urinary Tract: The  adrenal glands are within normal limits. The right kidney demonstrates a prominent extrarenal pelvis although no obstructive changes are seen. The left kidney is partially pelvic in location. No obstructive changes or calculi are seen. Stomach/Bowel: Diverticulosis of the colon is noted without evidence of diverticulitis. The appendix is not well visualized consistent with the surgical history. Vascular/Lymphatic: Aortic atherosclerosis. No enlarged abdominal or pelvic lymph nodes. Reproductive: Uterus and bilateral adnexa are unremarkable. Other: No abdominal wall hernia or abnormality. No abdominopelvic ascites. Musculoskeletal: Degenerative changes of lumbar spine are seen. IMPRESSION: Cholelithiasis without complicating factors. No biliary dilatation is seen. Hepatic cyst. No acute abnormality noted. Electronically Signed   By: Alcide Clever M.D.   On: 06/27/2016 18:32   Dg Chest 2 View  Result Date: 06/27/2016 CLINICAL DATA:  Hypotension. EXAM: CHEST  2 VIEW COMPARISON:  None 07/07/2013 FINDINGS: Marked cardiomegaly. No confluent airspace opacities, effusions or edema. No acute bony abnormality. IMPRESSION: Marked cardiomegaly.  No active disease. Electronically Signed   By: Charlett Nose M.D.   On: 06/27/2016 16:17   Ct Head Wo Contrast  Result Date: 06/27/2016 CLINICAL DATA:  Fall, posterior head injury EXAM: CT HEAD WITHOUT CONTRAST TECHNIQUE: Contiguous axial images were obtained from the base of the skull through the vertex without intravenous contrast. COMPARISON:  03/08/2015 FINDINGS: Brain: No evidence of acute infarction, hemorrhage, hydrocephalus, extra-axial collection or mass lesion/mass effect. Global cortical atrophy. Subcortical white matter and periventricular small vessel ischemic changes. Vascular: No hyperdense vessel or unexpected calcification. Skull: Normal. Negative for fracture or focal lesion. Sinuses/Orbits: Partial opacification of the left frontal sinus. Visualized paranasal  sinuses the mastoid air cells are otherwise clear. Other: None. IMPRESSION: No evidence of acute intracranial abnormality. Atrophy with small vessel ischemic changes. Electronically Signed   By: Charline Bills M.D.   On: 06/27/2016 16:47   Dg Chest Port 1 View  Result Date: 06/28/2016 CLINICAL DATA:  Dyspnea EXAM: PORTABLE CHEST 1 VIEW COMPARISON:  06/27/2016 FINDINGS: Cardiomegaly.  No frank interstitial edema. Left lung base is obscured.  No definite pleural effusion. No pneumothorax. IMPRESSION: Cardiomegaly.  No frank interstitial edema. Electronically Signed   By: Charline Bills M.D.   On: 06/28/2016 17:49   US Abdomen Limited Ruq  Result Date: 06/27/2016 CLINICAL DATA:  Elevated bilirubin EXAM: US ABDOMEN LIMITED - RIGHT UPPER QUADRANT COMPARISON:  None. FINDINGS: Gallbladder: Gallbladder is well distended with multiple stones. Mild gallbladder wall thickening is noted. No pericholecystic fluid is seen. Common bile duct: Diameter: 3 mm. Liver: Mildly heterogeneous although no focal lesion is seen. No definitive biliary obstruction is noted. IMPRESSION: Cholelithiasis with mild wall thickening. No other focal abnormality is seen. Electronically Signed   By: Alcide Clever M.D.   On: 06/27/2016 18:19    Cardiac Studies   Echo: 05/20/16:Study Conclusions  - Left ventricle: Systolic function was normal. The estimated   ejection fraction was in the range of 55% to 60%. Wall motion was   normal; there were no regional wall motion abnormalities. - Mitral valve: There was mild to moderate regurgitation directed   centrally. - Left atrium: The atrium was massively dilated. - Right atrium: The atrium was mildly dilated. - Atrial septum: The septum bowed from left to right, consistent   with increased left atrial pressure. No defect or patent foramen   ovale was identified. - Pericardium, extracardiac: A small pericardial effusion was   identified posterior to the heart. There was no evidence  of   hemodynamic compromise.  Patient Profile     83 y.o. female with a history of NICM with improvement of EF to normal, mild-mod MR, chronic atrial fibrillation on coumadin, HTN and hypothyroidism who presented to [facility] on 06/27/16 with diarrhea, weakness and fall. Found to have urosepsis and Ecoli bacteremia.  Assessment & Plan    1. Atrial fibrillation with aberrancy. No VT. Rate controlled on Coreg. 2. Chronic diastolic CHF. History of prior nonischemic cardiomyopathy. Recent Echo showed normal LV function. Diuretics on hold secondary to hypotension and sepsis. I/O positive 3.4 liters with fluid resuscitation. Would continue to hold diuretics in setting of hypotension. If BP improves may consider resuming lasix.  3. UTI- E coli with urosepsis. Sepsis syndrome improving. Less acidosis, improved renal function. WBC lower. Continue antibiotics per primary team. 4. Thrombocytopenia.  5  Hyponatremia.   Signed, Analyn Matusek [place], MD  06/29/2016, 8:38 AM

## 2016-06-30 DIAGNOSIS — R040 Epistaxis: Secondary | ICD-10-CM | POA: Diagnosis not present

## 2016-06-30 DIAGNOSIS — N179 Acute kidney failure, unspecified: Secondary | ICD-10-CM

## 2016-06-30 DIAGNOSIS — R197 Diarrhea, unspecified: Secondary | ICD-10-CM

## 2016-06-30 LAB — PROTIME-INR
INR: 3.5
Prothrombin Time: 35.9 seconds — ABNORMAL HIGH (ref 11.4–15.2)

## 2016-06-30 LAB — BASIC METABOLIC PANEL
Anion gap: 9 (ref 5–15)
BUN: 26 mg/dL — AB (ref 6–20)
CALCIUM: 8.9 mg/dL (ref 8.9–10.3)
CO2: 27 mmol/L (ref 22–32)
CREATININE: 0.68 mg/dL (ref 0.44–1.00)
Chloride: 97 mmol/L — ABNORMAL LOW (ref 101–111)
GFR calc Af Amer: >60 mL/min (ref >60)
Glucose, Bld: 102 mg/dL — ABNORMAL HIGH (ref 65–99)
Potassium: 4.1 mmol/L (ref 3.5–5.1)
SODIUM: 133 mmol/L — AB (ref 135–145)

## 2016-06-30 LAB — CBC
HCT: 32.6 % — ABNORMAL LOW (ref 36.0–46.0)
Hemoglobin: 10.8 g/dL — ABNORMAL LOW (ref 12.0–15.0)
MCH: 29.9 pg (ref 26.0–34.0)
MCHC: 33.1 g/dL (ref 30.0–36.0)
MCV: 90.3 fL (ref 78.0–100.0)
PLATELETS: 83 10*3/uL — AB (ref 150–400)
RBC: 3.61 MIL/uL — ABNORMAL LOW (ref 3.87–5.11)
RDW: 17.5 % — ABNORMAL HIGH (ref 11.5–15.5)
WBC: 7.2 10*3/uL (ref 4.0–10.5)

## 2016-06-30 LAB — CULTURE, BLOOD (ROUTINE X 2)

## 2016-06-30 LAB — TROPONIN I: Troponin I: 0.16 ng/mL (ref <0.03)

## 2016-06-30 MED ORDER — OXYMETAZOLINE HCL 0.05 % NA SOLN
1.0000 | Freq: Two times a day (BID) | NASAL | Status: DC | PRN
  Administered 2016-06-30: 1 via NASAL
  Filled 2016-06-30: qty 15

## 2016-06-30 MED ORDER — FUROSEMIDE 40 MG PO TABS
40.0000 mg | ORAL_TABLET | Freq: Every day | ORAL | Status: DC
  Administered 2016-06-30 – 2016-07-03 (×4): 40 mg via ORAL
  Filled 2016-06-30 (×4): qty 1

## 2016-06-30 NOTE — Progress Notes (Signed)
Nurse notified that patient was nose bleeding.  Nurse came to patient's room and found patient sitting in recliner holding a bloody tissue.  Patient noted to be blowing her nose.  Nurse educated patient not to blow her nose.  Nurse noted small amount of blood coming from patient's right nostril.  Area cleaned then packed with gauze.  Patient denies any discomfort.  Will monitor.  Elnita Maxwell, RN

## 2016-06-30 NOTE — Progress Notes (Signed)
CRITICAL VALUE ALERT  Critical value received:  Troponin 0.16  Date of notification:  06/30/16  Time of notification:  1205  Critical value read back:Yes.    Nurse who received alert:  salome campbell   MD notified (1st page):  [facility], [place]  Time of first page:  1210  MD notified (2nd page):  Time of second page:  Responding MD:  Barbara Cower  Time MD responded:  1210

## 2016-06-30 NOTE — Progress Notes (Signed)
Progress Note  Patient Name: Gabriela Hernandez Date of Encounter: 06/30/2016  Primary Cardiologist: Vasilis Luhman [place] MD  Subjective   Complains of  Weakness. Denies chest pain. Mild SOB.   Inpatient Medications    Scheduled Meds: . carvedilol  25 mg Oral BID WC  . cefTRIAXone (ROCEPHIN)  IV  2 g Intravenous Q24H  . feeding supplement (ENSURE ENLIVE)  237 mL Oral BID BM  . levothyroxine  112 mcg Oral QAC breakfast  . mouth rinse  15 mL Mouth Rinse BID  . sodium chloride flush  3 mL Intravenous Q12H  . Warfarin - Pharmacist Dosing Inpatient   Does not apply q1800   Continuous Infusions: . sodium chloride 10 mL/hr at 06/28/16 0845   PRN Meds: acetaminophen **OR** acetaminophen, ondansetron **OR** ondansetron (ZOFRAN) IV, polyethylene glycol, traZODone   Vital Signs    Vitals:   06/29/16 2055 06/29/16 2357 06/30/16 0548 06/30/16 0836  BP: (!) 88/44 (!) 91/50 106/63 (!) 107/56  Pulse:  79 73   Resp:  20 20   Temp:  98.6 F (37 C) 97.3 F (36.3 C)   TempSrc:  Oral Oral   SpO2:  99% 98% 98%  Weight:   136 lb 3.2 oz (61.8 kg)   Height:        Intake/Output Summary (Last 24 hours) at 06/30/16 1024 Last data filed at 06/30/16 0908  Gross per 24 hour  Intake             1400 ml  Output             1125 ml  Net              275 ml   Filed Weights   06/28/16 0300 06/29/16 1843 06/30/16 0548  Weight: 134 lb 4.2 oz (60.9 kg) 134 lb 12.8 oz (61.1 kg) 136 lb 3.2 oz (61.8 kg)    Telemetry    Afib with controlled VR- no VT - Personally Reviewed  ECG    Afib, NSIVCD - Personally Reviewed  Physical Exam   GEN: Ederly, frail WF.   Neck: ++ JVD Cardiac: IRRR, no murmurs, rubs, or gallops. 1+ bilateral pitting edema. Respiratory: Clear to auscultation bilaterally. Diminished BS in bases. GI: Soft, nontender, non-distended  MS: No edema; No deformity. Neuro:  Nonfocal  Psych: Normal affect   Labs    Chemistry Recent Labs Lab 06/27/16 1540  06/28/16 0428  06/28/16 0750 06/28/16 1349 06/30/16 0519  NA  --   < > 129* 129* 129* 133*  K  --   < > 3.6 4.0 3.9 4.1  CL  --   < > 96* 100* 96* 97*  CO2  --   < > 22 22 21* 27  GLUCOSE  --   < > 99 99 147* 102*  BUN  --   < > 32* 32* 31* 26*  CREATININE  --   < > 1.05* 1.03* 1.02* 0.68  CALCIUM  --   < > 8.3* 8.5* 8.6* 8.9  PROT 5.2*  --  4.7*  --   --   --   ALBUMIN 3.0*  --  2.5*  --   --   --   AST 52*  --  52*  --   --   --   ALT 34  --  35  --   --   --   ALKPHOS 50  --  45  --   --   --  BILITOT 2.9*  --  1.8*  --   --   --   GFRNONAA  --   < > 46* 47* 48* >60  GFRAA  --   < > 54* 55* 56* >60  ANIONGAP  --   < > 11 7 12 9   < > = values in this interval not displayed.   Hematology  Recent Labs Lab 06/27/16 1429 06/28/16 0428 06/28/16 0940 06/30/16 0519  WBC 8.9 6.6  --  7.2  RBC 3.65* 3.40*  --  3.61*  HGB 11.1* 10.2*  --  10.8*  HCT 32.9* 30.4*  --  32.6*  MCV 90.1 89.4  --  90.3  MCH 30.4 30.0  --  29.9  MCHC 33.7 33.6  --  33.1  RDW 17.7* 17.9*  --  17.5*  PLT 122* 82* 86* 83*    Cardiac Enzymes  Recent Labs Lab 06/27/16 1936 06/27/16 2252 06/28/16 0428  TROPONINI 0.03* 0.03* 0.14*     Recent Labs Lab 06/27/16 1449  TROPIPOC 0.06     BNPNo results for input(s): BNP, PROBNP in the last 168 hours.   DDimer   Recent Labs Lab 06/28/16 0940  DDIMER 2.09*     Radiology    Dg Chest Port 1 View  Result Date: 06/28/2016 CLINICAL DATA:  Dyspnea EXAM: PORTABLE CHEST 1 VIEW COMPARISON:  06/27/2016 FINDINGS: Cardiomegaly.  No frank interstitial edema. Left lung base is obscured.  No definite pleural effusion. No pneumothorax. IMPRESSION: Cardiomegaly.  No frank interstitial edema. Electronically Signed   By: Charline Bills M.D.   On: 06/28/2016 17:49    Cardiac Studies   Echo: 05/20/16:Study Conclusions  - Left ventricle: Systolic function was normal. The estimated   ejection fraction was in the range of 55% to 60%. Wall motion was   normal; there  were no regional wall motion abnormalities. - Mitral valve: There was mild to moderate regurgitation directed   centrally. - Left atrium: The atrium was massively dilated. - Right atrium: The atrium was mildly dilated. - Atrial septum: The septum bowed from left to right, consistent   with increased left atrial pressure. No defect or patent foramen   ovale was identified. - Pericardium, extracardiac: A small pericardial effusion was   identified posterior to the heart. There was no evidence of   hemodynamic compromise.  Patient Profile     83 y.o. female with a history of NICM with improvement of EF to normal, mild-mod MR, chronic atrial fibrillation on coumadin, HTN and hypothyroidism who presented to [facility] on 06/27/16 with diarrhea, weakness and fall. Found to have urosepsis and Ecoli bacteremia.  Assessment & Plan    1. Atrial fibrillation with aberrancy. No VT. Rate controlled on Coreg. 2. Chronic diastolic CHF. History of prior nonischemic cardiomyopathy. Recent Echo showed normal LV function. Diuretics on hold secondary to hypotension and sepsis. Weight is now up. BP improved. Renal function and hyponatremia improved. Will resume lasix 40 mg po daily.  3. UTI- E coli with urosepsis. Sepsis syndrome improving. Less acidosis, improved renal function. WBC lower. Continue antibiotics per primary team. 4. Thrombocytopenia.  5  Hyponatremia. Improved.  Signed, Heidi Maclin [place], MD  06/30/2016, 10:24 AM

## 2016-06-30 NOTE — Progress Notes (Signed)
ANTICOAGULATION CONSULT NOTE - Initial Consult  Pharmacy Consult for Warfarin Indication: atrial fibrillation  Assessment: 63 yoF presented to the ED with weakness and a fall. She is on chronic coumadin 5mg  daily PTA for history of Afib. INR was supratherapeutic (3.38) on admission. Head CT negative for bleed. INR continued to rise until peaked at 4.68 on 2/11. Now INR down to 3.5 today. Hgb 10.8, plts low at 83. No s/s of bleed. Has not had any Coumadin since admission.  Goal of Therapy:  INR 2-3 Monitor platelets by anticoagulation protocol: Yes   Plan:  No warfarin tonight Monitor daily INR, CBC, s/s of bleed       No Known Allergies  Patient Measurements: Height: 5\' 6"  (167.6 cm) Weight: 136 lb 3.2 oz (61.8 kg) (scale b) IBW/kg (Calculated) : 59.3  Vital Signs: Temp: 97.3 F (36.3 C) (02/13 0548) Temp Source: Oral (02/13 0548) BP: 107/56 (02/13 0836) Pulse Rate: 73 (02/13 0548)  Labs:  Recent Labs  06/27/16 1429  06/27/16 1540 06/27/16 1936 06/27/16 2252 06/28/16 0428 06/28/16 0750 06/28/16 0940 06/28/16 1349 06/29/16 0318 06/30/16 0519  HGB 11.1*  --   --   --   --  10.2*  --   --   --   --  10.8*  HCT 32.9*  --   --   --   --  30.4*  --   --   --   --  32.6*  PLT 122*  --   --   --   --  82*  --  86*  --   --  83*  APTT  --   --   --   --   --   --   --  61*  57*  --   --   --   LABPROT  --   < > 35.0*  --   --  45.4*  --  45.3*  --  42.3* 35.9*  INR  --   < > 3.38  --   --  4.64*  --  4.68*  --  4.29* 3.50  CREATININE 1.28*  --   --  1.06* 1.02* 1.05* 1.03*  --  1.02*  --  0.68  CKTOTAL  --   --  506*  --   --  654*  --   --   --   --   --   TROPONINI  --   --   --  0.03* 0.03* 0.14*  --   --   --   --   --   < > = values in this interval not displayed.  Estimated Creatinine Clearance: 46.4 mL/min (by C-G formula based on SCr of 0.68 mg/dL).   Medical History: Past Medical History:  Diagnosis Date  . Atrial fibrillation (HCC)   . Chronic  systolic CHF (congestive heart failure) (HCC) 04/05/2012  . Dilated cardiomyopathy (HCC)    with ejection fraction of 30-35%  . Dysphagia   . Esophageal stricture   . History of shingles   . Hypertension   . Hypothyroidism   . Lightheadedness     Enzo Bi, PharmD, BCPS Clinical Pharmacist Pager [phone] 06/30/2016 11:13 AM

## 2016-06-30 NOTE — Progress Notes (Signed)
Progress Note    Gabriela Hernandez  FVC:[id number] DOB: 1927/01/10  DOA: 06/27/2016 PCP: Lupe Carney, MD    Brief Narrative:   Chief complaint: Follow-up diarrhea and weakness  Gabriela Hernandez is an 83 y.o. female with a PMH of atrial fibrillation on chronic Coumadin, dilated nonischemic cardiomyopathy with preserved EF per echo 05/20/16, hypothyroidism and hypertension who was admitted 06/27/16 with a chief complaint of diarrhea and weakness with reports of a fall. Upon initial evaluation in the ED, the patient was found to be hypotensive.  Assessment/Plan:   Principal Problem:   Sepsis secondary to Escherichia coli bacteremia/hypotension/UTI Blood pressure 71/37 on admission. Hypotension initially thought to be from hypovolemia. Fluid volume resuscitated with improved blood pressure overnight. 3/4 blood cultures growing Escherichia coli, so placed on ceftriaxone. Sepsis order set placed.  Critical care team consulted due to high risk of deterioration. Patient is now DO NOT RESUSCITATE. She responded well to diuretic therapy, and is less dyspneic today. Blood pressure remains low but mentating well. Lactate has cleared.We'll transfer to telemetry.  Active Problems:   Acute respiratory failure with hypoxia/acute pulmonary edema Secondary to sepsis and acute on chronic CHF. Respiratory status better today. Continue supplemental oxygen.    Epistaxis Afrin ordered as needed, humidify oxygen.    Elevated CK/rhabdomyolysis secondary to fall  Elevated CK levels likely related to rhabdomyolysis from fall. Unable to hydrate secondary to heart failure.    Demand ischemia/troponin elevation/acute on chronic systolic CHF Suspect troponin elevation is secondary hypotension-induced demand ischemia. 12-lead EKG done on admission (personally reviewed) showed atrial fibrillation, And repeat 12-lead EKG showed aberrantly conducted complexes. Last 2-D echo done 05/20/16 and showed an EF of 55-60  percent. There was also a small pericardial effusion identified, but no hemodynamic compromise. Unfortunately, soft blood pressure limits ability to diurese.     Thrombocytopenia Likely related to sepsis. DIC panel negative for schistocytes and fibrinogen levels normal. Treat underlying infection and follow platelets.    Acute kidney injury Baseline creatinine is 0.5. Initial creatinine elevated, but creatinine has now improved and is close to baseline values.     Atrial fibrillation (HCC) Rate controlled on chronic Coumadin. INR supratherapeutic on admission. Coumadin per pharmacy.    Diarrhea GI pathogen panel negative.    Hyponatremia Likely from CHF physiology, improving.    Fall PT evaluation.    Hypothyroidism Continue Synthroid. TSH WNL.    Hyperbilirubinemia Bilirubin mildly elevated. CT of the abdomen showed cholelithiasis without complicating factors and no biliary dilatation. RUQ ultrasound also showed cholelithiasis with mild wall thickening but no other focal abnormalities. Bilirubin improving with hydration, continue to monitor. No reports of abdominal pain.    Severe protein calorie malnutrition/underweight Body mass index is 21.98 kg/m. Dietitian consultation.   Family Communication/Anticipated D/C date and plan/Code Status   DVT prophylaxis: Coumadin ordered. Code Status: Full Code.  Family Communication: Son at the bedside. Disposition Plan: SNF in 24-48 hours.   Medical Consultants:    PCCM  Cardiology   Procedures:    None  Anti-Infectives:    Rocephin 06/28/16--->  Subjective:   Patient has had an episode of epistaxis of the right knee air, has gauze packing in place. Reports that her shortness of breath is a bit better, but she continues to have labored breathing. No chest pain.  Objective:    Vitals:   06/29/16 2055 06/29/16 2357 06/30/16 0548 06/30/16 0836  BP: (!) 88/44 (!) 91/50 106/63 (!) 107/56  Pulse:  79  73   Resp:  20  20   Temp:  98.6 F (37 C) 97.3 F (36.3 C)   TempSrc:  Oral Oral   SpO2:  99% 98% 98%  Weight:   61.8 kg (136 lb 3.2 oz)   Height:        Intake/Output Summary (Last 24 hours) at 06/30/16 0839 Last data filed at 06/30/16 0630  Gross per 24 hour  Intake             1080 ml  Output             1125 ml  Net              -45 ml   Filed Weights   06/28/16 0300 06/29/16 1843 06/30/16 0548  Weight: 60.9 kg (134 lb 4.2 oz) 61.1 kg (134 lb 12.8 oz) 61.8 kg (136 lb 3.2 oz)    Exam: General exam: Appears calm.Looks better than labs/clinical scenario would suggest. Respiratory system: A a few crackles, labored breathing with tachypnea and use of accessory muscles. Cardiovascular system: S1 & S2 heard, RRR. + JVD,  rubs, gallops or clicks. No murmurs. Gastrointestinal system: Abdomen is nondistended, soft and nontender. No organomegaly or masses felt. Normal bowel sounds heard. Central nervous system: Alert and oriented. No focal neurological deficits. Extremities: No clubbing, or cyanosis. 3+ edema. Skin: Venous stasis dermatitis. Psychiatry: Judgement and insight appear normal. Mood & affect appropriate.   Data Reviewed:   I have personally reviewed following labs and imaging studies:  Labs: Basic Metabolic Panel:  Recent Labs Lab 06/27/16 1540  06/27/16 2252 06/28/16 0428 06/28/16 0750 06/28/16 1349 06/30/16 0519  NA  --   < > 131* 129* 129* 129* 133*  K  --   < > 3.8 3.6 4.0 3.9 4.1  CL  --   < > 99* 96* 100* 96* 97*  CO2  --   < > 21* 22 22 21* 27  GLUCOSE  --   < > 126* 99 99 147* 102*  BUN  --   < > 29* 32* 32* 31* 26*  CREATININE  --   < > 1.02* 1.05* 1.03* 1.02* 0.68  CALCIUM  --   < > 8.3* 8.3* 8.5* 8.6* 8.9  MG 1.7  --   --  2.2  --   --   --   < > = values in this interval not displayed. GFR Estimated Creatinine Clearance: 46.4 mL/min (by C-G formula based on SCr of 0.68 mg/dL). Liver Function Tests:  Recent Labs Lab 06/27/16 1540 06/28/16 0428  AST  52* 52*  ALT 34 35  ALKPHOS 50 45  BILITOT 2.9* 1.8*  PROT 5.2* 4.7*  ALBUMIN 3.0* 2.5*   No results for input(s): LIPASE, AMYLASE in the last 168 hours. No results for input(s): AMMONIA in the last 168 hours. Coagulation profile  Recent Labs Lab 06/27/16 1540 06/28/16 0428 06/28/16 0940 06/29/16 0318 06/30/16 0519  INR 3.38 4.64* 4.68* 4.29* 3.50    CBC:  Recent Labs Lab 06/27/16 1429 06/28/16 0428 06/28/16 0940 06/30/16 0519  WBC 8.9 6.6  --  7.2  HGB 11.1* 10.2*  --  10.8*  HCT 32.9* 30.4*  --  32.6*  MCV 90.1 89.4  --  90.3  PLT 122* 82* 86* 83*   Cardiac Enzymes:  Recent Labs Lab 06/27/16 1540 06/27/16 1936 06/27/16 2252 06/28/16 0428  CKTOTAL 506*  --   --  654*  TROPONINI  --  0.03* 0.03*  0.14*   BNP (last 3 results) No results for input(s): PROBNP in the last 8760 hours. CBG:  Recent Labs Lab 06/27/16 1521 06/29/16 1116  GLUCAP 121* 221*   D-Dimer:  Recent Labs  06/28/16 0940  DDIMER 2.09*   Thyroid function studies:  Recent Labs  06/27/16 1540  TSH 1.168   Sepsis Labs:  Recent Labs Lab 06/27/16 1429 06/27/16 1450 06/27/16 1936 06/27/16 1940 06/28/16 0428 06/28/16 0940 06/28/16 1349 06/30/16 0519  PROCALCITON  --   --  6.63  --   --  7.08  --   --   WBC 8.9  --   --   --  6.6  --   --  7.2  LATICACIDVEN  --  2.52*  --  1.07  --  1.7 1.5  --     Microbiology Recent Results (from the past 240 hour(s))  Blood culture (routine x 2)     Status: None (Preliminary result)   Collection Time: 06/27/16  3:25 PM  Result Value Ref Range Status   Specimen Description BLOOD LEFT ANTECUBITAL  Final   Special Requests BOTTLES DRAWN AEROBIC ONLY 5CC  Final   Culture  Setup Time   Final    GRAM NEGATIVE RODS IN BOTH AEROBIC AND ANAEROBIC BOTTLES CRITICAL VALUE NOTED.  VALUE IS CONSISTENT WITH PREVIOUSLY REPORTED AND CALLED VALUE.    Culture GRAM NEGATIVE RODS  Final   Report Status PENDING  Incomplete  Blood culture (routine x  2)     Status: Abnormal (Preliminary result)   Collection Time: 06/27/16  3:38 PM  Result Value Ref Range Status   Specimen Description BLOOD RIGHT ANTECUBITAL  Final   Special Requests BOTTLES DRAWN AEROBIC AND ANAEROBIC 5CC  Final   Culture  Setup Time   Final    GRAM NEGATIVE RODS IN BOTH AEROBIC AND ANAEROBIC BOTTLES CRITICAL RESULT CALLED TO, READ BACK BY AND VERIFIED WITH: A ANDERSON,PHARMD AT 0821 06/28/16 BY L BENFIELD    Culture ESCHERICHIA COLI SUSCEPTIBILITIES TO FOLLOW  (A)  Final   Report Status PENDING  Incomplete  Blood Culture ID Panel (Reflexed)     Status: Abnormal   Collection Time: 06/27/16  3:38 PM  Result Value Ref Range Status   Enterococcus species NOT DETECTED NOT DETECTED Final   Listeria monocytogenes NOT DETECTED NOT DETECTED Final   Staphylococcus species NOT DETECTED NOT DETECTED Final   Staphylococcus aureus NOT DETECTED NOT DETECTED Final   Streptococcus species NOT DETECTED NOT DETECTED Final   Streptococcus agalactiae NOT DETECTED NOT DETECTED Final   Streptococcus pneumoniae NOT DETECTED NOT DETECTED Final   Streptococcus pyogenes NOT DETECTED NOT DETECTED Final   Acinetobacter baumannii NOT DETECTED NOT DETECTED Final   Enterobacteriaceae species DETECTED (A) NOT DETECTED Final    Comment: Enterobacteriaceae represent a large family of gram-negative bacteria, not a single organism. CRITICAL RESULT CALLED TO, READ BACK BY AND VERIFIED WITH: A ANDERSON,PHARMD AT 1610 06/28/16 BY L BENFIELD    Enterobacter cloacae complex NOT DETECTED NOT DETECTED Final   Escherichia coli DETECTED (A) NOT DETECTED Final    Comment: CRITICAL RESULT CALLED TO, READ BACK BY AND VERIFIED WITH: A ANDERSON,PHARMD AT 9604 06/28/16 BY L BENFIELD    Klebsiella oxytoca NOT DETECTED NOT DETECTED Final   Klebsiella pneumoniae NOT DETECTED NOT DETECTED Final   Proteus species NOT DETECTED NOT DETECTED Final   Serratia marcescens NOT DETECTED NOT DETECTED Final   Carbapenem  resistance NOT DETECTED NOT DETECTED Final   Haemophilus  influenzae NOT DETECTED NOT DETECTED Final   Neisseria meningitidis NOT DETECTED NOT DETECTED Final   Pseudomonas aeruginosa NOT DETECTED NOT DETECTED Final   Candida albicans NOT DETECTED NOT DETECTED Final   Candida glabrata NOT DETECTED NOT DETECTED Final   Candida krusei NOT DETECTED NOT DETECTED Final   Candida parapsilosis NOT DETECTED NOT DETECTED Final   Candida tropicalis NOT DETECTED NOT DETECTED Final  MRSA PCR Screening     Status: None   Collection Time: 06/27/16  8:28 PM  Result Value Ref Range Status   MRSA by PCR NEGATIVE NEGATIVE Final    Comment:        The GeneXpert MRSA Assay (FDA approved for NASAL specimens only), is one component of a comprehensive MRSA colonization surveillance program. It is not intended to diagnose MRSA infection nor to guide or monitor treatment for MRSA infections.   Gastrointestinal Panel by PCR , Stool     Status: None   Collection Time: 06/27/16  8:33 PM  Result Value Ref Range Status   Campylobacter species NOT DETECTED NOT DETECTED Final   Plesimonas shigelloides NOT DETECTED NOT DETECTED Final   Salmonella species NOT DETECTED NOT DETECTED Final   Yersinia enterocolitica NOT DETECTED NOT DETECTED Final   Vibrio species NOT DETECTED NOT DETECTED Final   Vibrio cholerae NOT DETECTED NOT DETECTED Final   Enteroaggregative E coli (EAEC) NOT DETECTED NOT DETECTED Final   Enteropathogenic E coli (EPEC) NOT DETECTED NOT DETECTED Final   Enterotoxigenic E coli (ETEC) NOT DETECTED NOT DETECTED Final   Shiga like toxin producing E coli (STEC) NOT DETECTED NOT DETECTED Final   Shigella/Enteroinvasive E coli (EIEC) NOT DETECTED NOT DETECTED Final   Cryptosporidium NOT DETECTED NOT DETECTED Final   Cyclospora cayetanensis NOT DETECTED NOT DETECTED Final   Entamoeba histolytica NOT DETECTED NOT DETECTED Final   Giardia lamblia NOT DETECTED NOT DETECTED Final   Adenovirus F40/41 NOT  DETECTED NOT DETECTED Final   Astrovirus NOT DETECTED NOT DETECTED Final   Norovirus GI/GII NOT DETECTED NOT DETECTED Final   Rotavirus A NOT DETECTED NOT DETECTED Final   Sapovirus (I, II, IV, and V) NOT DETECTED NOT DETECTED Final  C difficile quick scan w PCR reflex     Status: None   Collection Time: 06/27/16  8:33 PM  Result Value Ref Range Status   C Diff antigen NEGATIVE NEGATIVE Final   C Diff toxin NEGATIVE NEGATIVE Final   C Diff interpretation No C. difficile detected.  Final    Radiology: Dg Chest Port 1 View  Result Date: 06/28/2016 CLINICAL DATA:  Dyspnea EXAM: PORTABLE CHEST 1 VIEW COMPARISON:  06/27/2016 FINDINGS: Cardiomegaly.  No frank interstitial edema. Left lung base is obscured.  No definite pleural effusion. No pneumothorax. IMPRESSION: Cardiomegaly.  No frank interstitial edema. Electronically Signed   By: Charline Bills M.D.   On: 06/28/2016 17:49    Medications:   . carvedilol  25 mg Oral BID WC  . cefTRIAXone (ROCEPHIN)  IV  2 g Intravenous Q24H  . feeding supplement (ENSURE ENLIVE)  237 mL Oral BID BM  . levothyroxine  112 mcg Oral QAC breakfast  . mouth rinse  15 mL Mouth Rinse BID  . sodium chloride flush  3 mL Intravenous Q12H  . Warfarin - Pharmacist Dosing Inpatient   Does not apply q1800   Continuous Infusions: . sodium chloride 10 mL/hr at 06/28/16 0845    Medical decision making is of high complexity and this patient is at high  risk of deterioration, therefore this is a level 3 visit.  (> 4 problem points, 2 data points, high risk)   LOS: 2 days   Rashonda Warrior  [facility] Pager [phone]. If unable to reach me by pager, please call my cell phone at [phone].  *Please refer to [email or web address], password [email or web address] to get updated schedule on who will round on this patient, as hospitalists switch teams weekly. If 7PM-7AM, please contact night-coverage at [email or web address], password [email or web address] for any overnight needs.  06/30/2016, 8:39 AM

## 2016-07-01 LAB — BASIC METABOLIC PANEL
ANION GAP: 9 (ref 5–15)
BUN: 22 mg/dL — ABNORMAL HIGH (ref 6–20)
CALCIUM: 8.9 mg/dL (ref 8.9–10.3)
CHLORIDE: 94 mmol/L — AB (ref 101–111)
CO2: 30 mmol/L (ref 22–32)
Creatinine, Ser: 0.66 mg/dL (ref 0.44–1.00)
GFR calc non Af Amer: >60 mL/min (ref >60)
Glucose, Bld: 96 mg/dL (ref 65–99)
Potassium: 4.2 mmol/L (ref 3.5–5.1)
Sodium: 133 mmol/L — ABNORMAL LOW (ref 135–145)

## 2016-07-01 LAB — CBC
HCT: 34.4 % — ABNORMAL LOW (ref 36.0–46.0)
HEMOGLOBIN: 11.1 g/dL — AB (ref 12.0–15.0)
MCH: 29.4 pg (ref 26.0–34.0)
MCHC: 32.3 g/dL (ref 30.0–36.0)
MCV: 91.2 fL (ref 78.0–100.0)
Platelets: 105 10*3/uL — ABNORMAL LOW (ref 150–400)
RBC: 3.77 MIL/uL — AB (ref 3.87–5.11)
RDW: 17.5 % — ABNORMAL HIGH (ref 11.5–15.5)
WBC: 8.6 10*3/uL (ref 4.0–10.5)

## 2016-07-01 LAB — PROTIME-INR
INR: 2.85
Prothrombin Time: 30.5 seconds — ABNORMAL HIGH (ref 11.4–15.2)

## 2016-07-01 LAB — CK: Total CK: 82 U/L (ref 38–234)

## 2016-07-01 MED ORDER — WARFARIN SODIUM 2 MG PO TABS
2.0000 mg | ORAL_TABLET | Freq: Once | ORAL | Status: AC
  Administered 2016-07-01: 2 mg via ORAL
  Filled 2016-07-01: qty 1

## 2016-07-01 MED ORDER — FUROSEMIDE 20 MG PO TABS
20.0000 mg | ORAL_TABLET | Freq: Every evening | ORAL | Status: DC
  Administered 2016-07-01 – 2016-07-02 (×2): 20 mg via ORAL
  Filled 2016-07-01 (×2): qty 1

## 2016-07-01 NOTE — Progress Notes (Signed)
Progress Note    Gabriela Hernandez  ZOX:[id number] DOB: December 25, 1926  DOA: 06/27/2016   PCP: Lupe Carney, MD   Brief Narrative:   Chief complaint: Follow-up diarrhea and weakness  Gabriela Hernandez is an 83 y.o. female with a PMH of atrial fibrillation on chronic Coumadin, dilated nonischemic cardiomyopathy with preserved EF per echo 05/20/16, hypothyroidism and hypertension who was admitted 06/27/16 with a chief complaint of diarrhea and weakness with reports of a fall. Upon initial evaluation in the ED, the patient was found to be hypotensive.  Assessment/Plan:   Principal Problem:   Sepsis secondary to Escherichia coli bacteremia/hypotension/UTI Blood pressure 71/37 on admission. Hypotension initially thought to be from hypovolemia. Fluid volume resuscitated with improved blood pressure overnight. 3/4 blood cultures growing Escherichia coli, so placed on ceftriaxone. Sepsis order set placed.  Critical care team consulted due to high risk of deterioration. Patient is now DO NOT RESUSCITATE. She responded well to diuretic therapy, and is less dyspneic today. Blood pressure remains low but mentating well. Lactate has cleared.Sepsis resolved   Active Problems:   Acute respiratory failure with hypoxia/acute pulmonary edema Secondary to sepsis and acute on chronic CHF. Respiratory status better today. Continue supplemental oxygen.    Epistaxis Afrin ordered as needed, humidify oxygen.    Elevated CK/rhabdomyolysis secondary to fall  Elevated CK levels likely related to rhabdomyolysis from fall. Unable to hydrate secondary to heart failure.    Demand ischemia/troponin elevation/acute on chronic systolic CHF Suspect troponin elevation is secondary hypotension-induced demand ischemia. 12-lead EKG done on admission (personally reviewed) showed atrial fibrillation, And repeat 12-lead EKG showed aberrantly conducted complexes. Last 2-D echo done 05/20/16 and showed an EF of 55-60 percent. There  was also a small pericardial effusion identified, but no hemodynamic compromise. Unfortunately, soft blood pressure limits ability to diurese.     Thrombocytopenia Likely related to sepsis. DIC panel negative for schistocytes and fibrinogen levels normal. Improving. CBC in AM    Acute kidney injury Baseline creatinine is 0.5. Initial creatinine elevated, but creatinine has now improved and WNL. BMP in AM    Atrial fibrillation (HCC) Rate controlled on chronic Coumadin. INR supratherapeutic on admission. Coumadin per pharmacy.    Diarrhea GI pathogen panel negative.    Hyponatremia Likely from CHF physiology, improving.    Fall PT evaluation.    Hypothyroidism Continue Synthroid. TSH WNL.    Hyperbilirubinemia Bilirubin mildly elevated. CT of the abdomen showed cholelithiasis without complicating factors and no biliary dilatation. RUQ ultrasound also showed cholelithiasis with mild wall thickening but no other focal abnormalities. Bilirubin improving with hydration, continue to monitor. No reports of abdominal pain.    Severe protein calorie malnutrition/underweight Body mass index is 21.9 kg/m. Dietitian consulted    Family Communication/Anticipated D/C date and plan/Code Status   DVT prophylaxis: Coumadin ordered. Code Status: Full Code.  Family Communication: husband at the bedside. Disposition Plan: SNF in AM   Medical Consultants:    PCCM  Cardiology   Procedures:    None  Anti-Infectives:    Rocephin 06/28/16--->  Subjective:   NO concerns this AM except says she did not sleep much last night and feels weak this AM  Objective:    Vitals:   07/01/16 0532 07/01/16 0800 07/01/16 1215 07/01/16 2015  BP: (!) 142/84 128/75 (!) 112/49 105/61  Pulse: 70 69 69 67  Resp: 18  18 19   Temp: 97.4 F (36.3 C) 97.9 F (36.6 C) 97.5 F (36.4 C)  TempSrc: Oral Oral Oral Oral  SpO2: 100% 100% 99% 95%  Weight: 61.6 kg (135 lb 11.2 oz)     Height:         Intake/Output Summary (Last 24 hours) at 07/01/16 2126 Last data filed at 07/01/16 2015  Gross per 24 hour  Intake             1260 ml  Output             3350 ml  Net            -2090 ml   Filed Weights   06/29/16 1843 06/30/16 0548 07/01/16 0532  Weight: 61.1 kg (134 lb 12.8 oz) 61.8 kg (136 lb 3.2 oz) 61.6 kg (135 lb 11.2 oz)    Exam: General exam: Appears calm.Looks better than labs/clinical scenario would suggest. Respiratory system: A a few crackles, labored breathing with tachypnea and use of accessory muscles. Cardiovascular system: S1 & S2 heard, RRR. + JVD,  rubs, gallops or clicks. No murmurs. Gastrointestinal system: Abdomen is nondistended, soft and nontender. No organomegaly or masses felt. Normal bowel sounds heard. Central nervous system: Alert and oriented. No focal neurological deficits. Extremities: No clubbing, or cyanosis. 3+ edema. Skin: Venous stasis dermatitis. Psychiatry: Judgement and insight appear normal. Mood & affect appropriate.   Data Reviewed:   I have personally reviewed following labs and imaging studies:  Labs: Basic Metabolic Panel:  Recent Labs Lab 06/27/16 1540  06/28/16 0428 06/28/16 0750 06/28/16 1349 06/30/16 0519 07/01/16 0341  NA  --   < > 129* 129* 129* 133* 133*  K  --   < > 3.6 4.0 3.9 4.1 4.2  CL  --   < > 96* 100* 96* 97* 94*  CO2  --   < > 22 22 21* 27 30  GLUCOSE  --   < > 99 99 147* 102* 96  BUN  --   < > 32* 32* 31* 26* 22*  CREATININE  --   < > 1.05* 1.03* 1.02* 0.68 0.66  CALCIUM  --   < > 8.3* 8.5* 8.6* 8.9 8.9  MG 1.7  --  2.2  --   --   --   --   < > = values in this interval not displayed.  Liver Function Tests:  Recent Labs Lab 06/27/16 1540 06/28/16 0428  AST 52* 52*  ALT 34 35  ALKPHOS 50 45  BILITOT 2.9* 1.8*  PROT 5.2* 4.7*  ALBUMIN 3.0* 2.5*   Coagulation profile  Recent Labs Lab 06/28/16 0428 06/28/16 0940 06/29/16 0318 06/30/16 0519 07/01/16 0341  INR 4.64* 4.68* 4.29* 3.50  2.85    CBC:  Recent Labs Lab 06/27/16 1429 06/28/16 0428 06/28/16 0940 06/30/16 0519 07/01/16 0341  WBC 8.9 6.6  --  7.2 8.6  HGB 11.1* 10.2*  --  10.8* 11.1*  HCT 32.9* 30.4*  --  32.6* 34.4*  MCV 90.1 89.4  --  90.3 91.2  PLT 122* 82* 86* 83* 105*   Cardiac Enzymes:  Recent Labs Lab 06/27/16 1540 06/27/16 1936 06/27/16 2252 06/28/16 0428 06/30/16 1102 07/01/16 0341  CKTOTAL 506*  --   --  654*  --  82  TROPONINI  --  0.03* 0.03* 0.14* 0.16*  --     Recent Labs Lab 06/27/16 1521 06/29/16 1116  GLUCAP 121* 221*   Sepsis Labs:  Recent Labs Lab 06/27/16 1429 06/27/16 1450 06/27/16 1936 06/27/16 1940 06/28/16 0428 06/28/16 0940 06/28/16 1349 06/30/16 1610  07/01/16 0341  PROCALCITON  --   --  6.63  --   --  7.08  --   --   --   WBC 8.9  --   --   --  6.6  --   --  7.2 8.6  LATICACIDVEN  --  2.52*  --  1.07  --  1.7 1.5  --   --    Microbiology Recent Results (from the past 240 hour(s))  Blood culture (routine x 2)     Status: Abnormal   Collection Time: 06/27/16  3:25 PM  Result Value Ref Range Status   Specimen Description BLOOD LEFT ANTECUBITAL  Final   Special Requests BOTTLES DRAWN AEROBIC ONLY 5CC  Final   Culture  Setup Time   Final    GRAM NEGATIVE RODS IN BOTH AEROBIC AND ANAEROBIC BOTTLES CRITICAL VALUE NOTED.  VALUE IS CONSISTENT WITH PREVIOUSLY REPORTED AND CALLED VALUE.    Culture (A)  Final    ESCHERICHIA COLI SUSCEPTIBILITIES PERFORMED ON PREVIOUS CULTURE WITHIN THE LAST 5 DAYS.    Report Status 06/30/2016 FINAL  Final  Blood culture (routine x 2)     Status: Abnormal   Collection Time: 06/27/16  3:38 PM  Result Value Ref Range Status   Specimen Description BLOOD RIGHT ANTECUBITAL  Final   Special Requests BOTTLES DRAWN AEROBIC AND ANAEROBIC 5CC  Final   Culture  Setup Time   Final    GRAM NEGATIVE RODS IN BOTH AEROBIC AND ANAEROBIC BOTTLES CRITICAL RESULT CALLED TO, READ BACK BY AND VERIFIED WITH: A ANDERSON,PHARMD AT 0821  06/28/16 BY L BENFIELD    Culture ESCHERICHIA COLI (A)  Final   Report Status 06/30/2016 FINAL  Final   Organism ID, Bacteria ESCHERICHIA COLI  Final      Susceptibility   Escherichia coli - MIC*    AMPICILLIN >=32 RESISTANT Resistant     CEFAZOLIN <=4 SENSITIVE Sensitive     CEFEPIME <=1 SENSITIVE Sensitive     CEFTAZIDIME <=1 SENSITIVE Sensitive     CEFTRIAXONE <=1 SENSITIVE Sensitive     CIPROFLOXACIN <=0.25 SENSITIVE Sensitive     GENTAMICIN <=1 SENSITIVE Sensitive     IMIPENEM <=0.25 SENSITIVE Sensitive     TRIMETH/SULFA >=320 RESISTANT Resistant     AMPICILLIN/SULBACTAM 16 INTERMEDIATE Intermediate     PIP/TAZO <=4 SENSITIVE Sensitive     Extended ESBL NEGATIVE Sensitive     * ESCHERICHIA COLI  Blood Culture ID Panel (Reflexed)     Status: Abnormal   Collection Time: 06/27/16  3:38 PM  Result Value Ref Range Status   Enterococcus species NOT DETECTED NOT DETECTED Final   Listeria monocytogenes NOT DETECTED NOT DETECTED Final   Staphylococcus species NOT DETECTED NOT DETECTED Final   Staphylococcus aureus NOT DETECTED NOT DETECTED Final   Streptococcus species NOT DETECTED NOT DETECTED Final   Streptococcus agalactiae NOT DETECTED NOT DETECTED Final   Streptococcus pneumoniae NOT DETECTED NOT DETECTED Final   Streptococcus pyogenes NOT DETECTED NOT DETECTED Final   Acinetobacter baumannii NOT DETECTED NOT DETECTED Final   Enterobacteriaceae species DETECTED (A) NOT DETECTED Final    Comment: Enterobacteriaceae represent a large family of gram-negative bacteria, not a single organism. CRITICAL RESULT CALLED TO, READ BACK BY AND VERIFIED WITH: A ANDERSON,PHARMD AT 1610 06/28/16 BY L BENFIELD    Enterobacter cloacae complex NOT DETECTED NOT DETECTED Final   Escherichia coli DETECTED (A) NOT DETECTED Final    Comment: CRITICAL RESULT CALLED TO, READ BACK BY AND VERIFIED  WITH: A ANDERSON,PHARMD AT 0821 06/28/16 BY L BENFIELD    Klebsiella oxytoca NOT DETECTED NOT DETECTED Final    Klebsiella pneumoniae NOT DETECTED NOT DETECTED Final   Proteus species NOT DETECTED NOT DETECTED Final   Serratia marcescens NOT DETECTED NOT DETECTED Final   Carbapenem resistance NOT DETECTED NOT DETECTED Final   Haemophilus influenzae NOT DETECTED NOT DETECTED Final   Neisseria meningitidis NOT DETECTED NOT DETECTED Final   Pseudomonas aeruginosa NOT DETECTED NOT DETECTED Final   Candida albicans NOT DETECTED NOT DETECTED Final   Candida glabrata NOT DETECTED NOT DETECTED Final   Candida krusei NOT DETECTED NOT DETECTED Final   Candida parapsilosis NOT DETECTED NOT DETECTED Final   Candida tropicalis NOT DETECTED NOT DETECTED Final  MRSA PCR Screening     Status: None   Collection Time: 06/27/16  8:28 PM  Result Value Ref Range Status   MRSA by PCR NEGATIVE NEGATIVE Final    Comment:        The GeneXpert MRSA Assay (FDA approved for NASAL specimens only), is one component of a comprehensive MRSA colonization surveillance program. It is not intended to diagnose MRSA infection nor to guide or monitor treatment for MRSA infections.   Gastrointestinal Panel by PCR , Stool     Status: None   Collection Time: 06/27/16  8:33 PM  Result Value Ref Range Status   Campylobacter species NOT DETECTED NOT DETECTED Final   Plesimonas shigelloides NOT DETECTED NOT DETECTED Final   Salmonella species NOT DETECTED NOT DETECTED Final   Yersinia enterocolitica NOT DETECTED NOT DETECTED Final   Vibrio species NOT DETECTED NOT DETECTED Final   Vibrio cholerae NOT DETECTED NOT DETECTED Final   Enteroaggregative E coli (EAEC) NOT DETECTED NOT DETECTED Final   Enteropathogenic E coli (EPEC) NOT DETECTED NOT DETECTED Final   Enterotoxigenic E coli (ETEC) NOT DETECTED NOT DETECTED Final   Shiga like toxin producing E coli (STEC) NOT DETECTED NOT DETECTED Final   Shigella/Enteroinvasive E coli (EIEC) NOT DETECTED NOT DETECTED Final   Cryptosporidium NOT DETECTED NOT DETECTED Final   Cyclospora  cayetanensis NOT DETECTED NOT DETECTED Final   Entamoeba histolytica NOT DETECTED NOT DETECTED Final   Giardia lamblia NOT DETECTED NOT DETECTED Final   Adenovirus F40/41 NOT DETECTED NOT DETECTED Final   Astrovirus NOT DETECTED NOT DETECTED Final   Norovirus GI/GII NOT DETECTED NOT DETECTED Final   Rotavirus A NOT DETECTED NOT DETECTED Final   Sapovirus (I, II, IV, and V) NOT DETECTED NOT DETECTED Final  C difficile quick scan w PCR reflex     Status: None   Collection Time: 06/27/16  8:33 PM  Result Value Ref Range Status   C Diff antigen NEGATIVE NEGATIVE Final   C Diff toxin NEGATIVE NEGATIVE Final   C Diff interpretation No C. difficile detected.  Final    Radiology: No results found.  Medications:   . carvedilol  25 mg Oral BID WC  . cefTRIAXone (ROCEPHIN)  IV  2 g Intravenous Q24H  . feeding supplement (ENSURE ENLIVE)  237 mL Oral BID BM  . furosemide  20 mg Oral QPM  . furosemide  40 mg Oral Daily  . levothyroxine  112 mcg Oral QAC breakfast  . mouth rinse  15 mL Mouth Rinse BID  . sodium chloride flush  3 mL Intravenous Q12H  . Warfarin - Pharmacist Dosing Inpatient   Does not apply q1800   Continuous Infusions: . sodium chloride 10 mL/hr at 06/28/16 0845  LOS: 3 days   Debbora Presto  [facility] Pager [phone]. If unable to reach me by pager, please call my cell phone at [phone]  *Please refer to [email or web address], password [email or web address] to get updated schedule on who will round on this patient, as hospitalists switch teams weekly. If 7PM-7AM, please contact night-coverage at [email or web address], password [email or web address] for any overnight needs.  07/01/2016, 9:26 PM

## 2016-07-01 NOTE — Evaluation (Signed)
Physical Therapy Evaluation Patient Details Name: Gabriela Hernandez MRN: [id number] DOB: 10/23/1926 Today's Date: 07/01/2016   History of Present Illness  Pt is an 83 y/o female admitted secondary to weakness and a fall at home. Head CT negative. PMH including but not limited to A Fib, CHF, and HTN.  Clinical Impression  Pt presented supine in bed with HOB elevated, awake and willing to participate in therapy session. Prior to admission, pt reported that she was mod I with functional mobility using a RW to ambulate and independent with ADLs. Pt reported that she has had multiple falls in the past few months as her "balance is getting worse". Pt's family expressed concerns of pt falling again. Pt currently requires min guard for bed mobility, min A for transfers and close min guard for safety to ambulate a short distance (~25') using a RW. Pt would continue to benefit from skilled physical therapy services at this time while admitted and after d/c to address her below listed limitations in order to improve her overall safety and independence with functional mobility.      Follow Up Recommendations SNF;Supervision/Assistance - 24 hour (pt interested in [facility])    Equipment Recommendations  None recommended by PT;Other (comment) (defer to next venue)    Recommendations for Other Services       Precautions / Restrictions Precautions Precautions: Fall Restrictions Weight Bearing Restrictions: No      Mobility  Bed Mobility Overal bed mobility: Needs Assistance Bed Mobility: Supine to Sit     Supine to sit: Min guard     General bed mobility comments: increased time, HOB elevated, min guard for safety  Transfers Overall transfer level: Needs assistance Equipment used: Rolling walker (2 wheeled) Transfers: Sit to/from Stand Sit to Stand: Min assist         General transfer comment: increased time, min A to rise from bed into standing. pt attempted x3, successful on third  attempt  Ambulation/Gait Ambulation/Gait assistance: Min guard Ambulation Distance (Feet): 25 Feet Assistive device: Rolling walker (2 wheeled) Gait Pattern/deviations: Step-to pattern;Step-through pattern;Decreased step length - right;Decreased step length - left;Decreased stride length;Shuffle;Trunk flexed;Narrow base of support Gait velocity: decreased Gait velocity interpretation: Below normal speed for age/gender General Gait Details: pt with moderate instability with ambulation but no need for physical assistance or LOB, min guard for safety  Stairs            Wheelchair Mobility    Modified Rankin (Stroke Patients Only)       Balance Overall balance assessment: Needs assistance;History of Falls Sitting-balance support: Feet supported Sitting balance-Leahy Scale: Fair     Standing balance support: During functional activity;Bilateral upper extremity supported Standing balance-Leahy Scale: Poor Standing balance comment: pt reliant on bilateral UEs on RW                             Pertinent Vitals/Pain Pain Assessment: No/denies pain    Home Living Family/patient expects to be discharged to:: Skilled nursing facility Living Arrangements: Alone               Additional Comments: pt reported that she lives alone and does not have anyone to assist her 24/7 upon d/c.     Prior Function Level of Independence: Independent with assistive device(s)         Comments: pt reported that she uses a RW to ambulate and is independent with ADLs     Hand  Dominance        Extremity/Trunk Assessment   Upper Extremity Assessment Upper Extremity Assessment: Overall WFL for tasks assessed    Lower Extremity Assessment Lower Extremity Assessment: Generalized weakness    Cervical / Trunk Assessment Cervical / Trunk Assessment: Kyphotic  Communication   Communication: No difficulties  Cognition Arousal/Alertness: Awake/alert Behavior During  Therapy: WFL for tasks assessed/performed Overall Cognitive Status: Within Functional Limits for tasks assessed                      General Comments      Exercises     Assessment/Plan    PT Assessment Patient needs continued PT services  PT Problem List Decreased strength;Decreased activity tolerance;Decreased balance;Decreased mobility;Decreased coordination;Decreased knowledge of use of DME;Decreased safety awareness;Cardiopulmonary status limiting activity          PT Treatment Interventions DME instruction;Gait training;Stair training;Functional mobility training;Therapeutic activities;Therapeutic exercise;Balance training;Neuromuscular re-education;Patient/family education    PT Goals (Current goals can be found in the Care Plan section)  Acute Rehab PT Goals Patient Stated Goal: go to rehab to get stronger PT Goal Formulation: With patient/family Time For Goal Achievement: 07/15/16 Potential to Achieve Goals: Fair    Frequency Min 3X/week   Barriers to discharge        Co-evaluation               End of Session Equipment Utilized During Treatment: Gait belt Activity Tolerance: Patient limited by fatigue Patient left: in chair;with call bell/phone within reach;with family/visitor present Nurse Communication: Mobility status         Time: 1004-1030 PT Time Calculation (min) (ACUTE ONLY): 26 min   Charges:   PT Evaluation $PT Eval Low Complexity: 1 Procedure PT Treatments $Gait Training: 8-22 mins   PT G CodesAlessandra Bevels Ahana Hernandez 07/01/2016, 10:38 AM Deborah Chalk, PT, DPT [phone]

## 2016-07-01 NOTE — Progress Notes (Signed)
PT Cancellation Note  Patient Details Name: Andrica Rowlette Tews MRN: [id number] DOB: Feb 04, 1927   Cancelled Treatment:    Reason Eval/Treat Not Completed: Medical issues which prohibited therapy. Pt with elevated troponin (0.16) on 2/13. No new lab value as of this AM. PT will continue to f/u with pt as available and appropriate.    Alessandra Bevels Allis Quirarte 07/01/2016, 8:24 AM

## 2016-07-01 NOTE — Progress Notes (Signed)
Progress Note  Patient Name: Gabriela Hernandez Date of Encounter: 07/01/2016  Primary Cardiologist: Peter Swaziland MD  Subjective   Did not sleep well. Denies chest pain. SOB improved.   Inpatient Medications    Scheduled Meds: . carvedilol  25 mg Oral BID WC  . cefTRIAXone (ROCEPHIN)  IV  2 g Intravenous Q24H  . feeding supplement (ENSURE ENLIVE)  237 mL Oral BID BM  . furosemide  20 mg Oral Q1400  . furosemide  40 mg Oral Daily  . levothyroxine  112 mcg Oral QAC breakfast  . mouth rinse  15 mL Mouth Rinse BID  . sodium chloride flush  3 mL Intravenous Q12H  . Warfarin - Pharmacist Dosing Inpatient   Does not apply q1800   Continuous Infusions: . sodium chloride 10 mL/hr at 06/28/16 0845   PRN Meds: acetaminophen **OR** acetaminophen, ondansetron **OR** ondansetron (ZOFRAN) IV, oxymetazoline, polyethylene glycol, traZODone   Vital Signs    Vitals:   06/30/16 1700 06/30/16 2044 07/01/16 0532 07/01/16 0800  BP: 120/64 (!) 117/57 (!) 142/84 128/75  Pulse: (!) 54 73 70 69  Resp:  18 18   Temp:  97.8 F (36.6 C) 97.4 F (36.3 C) 97.9 F (36.6 C)  TempSrc:  Oral Oral Oral  SpO2:  97% 100% 100%  Weight:   135 lb 11.2 oz (61.6 kg)   Height:        Intake/Output Summary (Last 24 hours) at 07/01/16 0945 Last data filed at 07/01/16 0655  Gross per 24 hour  Intake             1020 ml  Output             1970 ml  Net             -950 ml   Filed Weights   06/29/16 1843 06/30/16 0548 07/01/16 0532  Weight: 134 lb 12.8 oz (61.1 kg) 136 lb 3.2 oz (61.8 kg) 135 lb 11.2 oz (61.6 kg)    Telemetry    Afib with controlled VR- no VT - Personally Reviewed  ECG    Afib, NSIVCD - Personally Reviewed  Physical Exam   GEN: [place], frail WF.   Neck: ++ JVD Cardiac: IRRR, no murmurs, rubs, or gallops. 1-2+ bilateral pitting edema. Respiratory: Clear to auscultation bilaterally. Diminished BS in bases. GI: Soft, nontender, non-distended  MS: No edema; No  deformity. Neuro:  Nonfocal  Psych: Normal affect   Labs    Chemistry Recent Labs Lab 06/27/16 1540  06/28/16 0428  06/28/16 1349 06/30/16 0519 07/01/16 0341  NA  --   < > 129*  < > 129* 133* 133*  K  --   < > 3.6  < > 3.9 4.1 4.2  CL  --   < > 96*  < > 96* 97* 94*  CO2  --   < > 22  < > 21* 27 30  GLUCOSE  --   < > 99  < > 147* 102* 96  BUN  --   < > 32*  < > 31* 26* 22*  CREATININE  --   < > 1.05*  < > 1.02* 0.68 0.66  CALCIUM  --   < > 8.3*  < > 8.6* 8.9 8.9  PROT 5.2*  --  4.7*  --   --   --   --   ALBUMIN 3.0*  --  2.5*  --   --   --   --  AST 52*  --  52*  --   --   --   --   ALT 34  --  35  --   --   --   --   ALKPHOS 50  --  45  --   --   --   --   BILITOT 2.9*  --  1.8*  --   --   --   --   GFRNONAA  --   < > 46*  < > 48* >60 >60  GFRAA  --   < > 54*  < > 56* >60 >60  ANIONGAP  --   < > 11  < > 12 9 9   < > = values in this interval not displayed.   Hematology  Recent Labs Lab 06/28/16 0428 06/28/16 0940 06/30/16 0519 07/01/16 0341  WBC 6.6  --  7.2 8.6  RBC 3.40*  --  3.61* 3.77*  HGB 10.2*  --  10.8* 11.1*  HCT 30.4*  --  32.6* 34.4*  MCV 89.4  --  90.3 91.2  MCH 30.0  --  29.9 29.4  MCHC 33.6  --  33.1 32.3  RDW 17.9*  --  17.5* 17.5*  PLT 82* 86* 83* 105*    Cardiac Enzymes  Recent Labs Lab 06/27/16 1936 06/27/16 2252 06/28/16 0428 06/30/16 1102  TROPONINI 0.03* 0.03* 0.14* 0.16*     Recent Labs Lab 06/27/16 1449  TROPIPOC 0.06     BNPNo results for input(s): BNP, PROBNP in the last 168 hours.   DDimer   Recent Labs Lab 06/28/16 0940  DDIMER 2.09*     Radiology    No results found.  Cardiac Studies   Echo: 05/20/16:Study Conclusions  - Left ventricle: Systolic function was normal. The estimated   ejection fraction was in the range of 55% to 60%. Wall motion was   normal; there were no regional wall motion abnormalities. - Mitral valve: There was mild to moderate regurgitation directed   centrally. - Left atrium:  The atrium was massively dilated. - Right atrium: The atrium was mildly dilated. - Atrial septum: The septum bowed from left to right, consistent   with increased left atrial pressure. No defect or patent foramen   ovale was identified. - Pericardium, extracardiac: A small pericardial effusion was   identified posterior to the heart. There was no evidence of   hemodynamic compromise.  Patient Profile     83 y.o. female with a history of NICM with improvement of EF to normal, mild-mod MR, chronic atrial fibrillation on coumadin, HTN and hypothyroidism who presented to [facility] on 06/27/16 with diarrhea, weakness and fall. Found to have urosepsis and Ecoli bacteremia.  Assessment & Plan    1. Atrial fibrillation with aberrancy. No VT. Rate controlled on Coreg. 2. Chronic diastolic CHF. History of prior nonischemic cardiomyopathy. Recent Echo showed normal LV function. Diuretics on hold secondary to hypotension and sepsis. Resumed yesterday 40 mg daily. Weight is  Up from baseline. BP improved. Renal function and hyponatremia improved. Will resume lasix home dose of 40 mg in am and 20 mg pm 3. UTI- E coli with urosepsis. Sepsis syndrome improving. Less acidosis, improved renal function. WBC lower. Continue antibiotics per primary team. 4. Thrombocytopenia.  5  Hyponatremia. Improved.  Patient had troponin elevated yesterday at 0.16. I do not know why troponin drawn. Consistent with troponins done on 2/11. No plans for ischemic work up. She is clear to proceed with PT evaluation.  Signed, Peter Swaziland, MD  07/01/2016, 9:45 AM

## 2016-07-01 NOTE — Progress Notes (Signed)
ANTICOAGULATION CONSULT NOTE - Follow Up Consult  Pharmacy Consult for Coumadin Indication: atrial fibrillation  No Known Allergies  Patient Measurements: Height: 5\' 6"  (167.6 cm) Weight: 135 lb 11.2 oz (61.6 kg) IBW/kg (Calculated) : 59.3 Heparin Dosing Weight:    Vital Signs: Temp: 97.9 F (36.6 C) (02/14 0800) Temp Source: Oral (02/14 0800) BP: 128/75 (02/14 0800) Pulse Rate: 69 (02/14 0800)  Labs:  Recent Labs  06/28/16 1349 06/29/16 0318 06/30/16 0519 06/30/16 1102 07/01/16 0341  HGB  --   --  10.8*  --  11.1*  HCT  --   --  32.6*  --  34.4*  PLT  --   --  83*  --  105*  LABPROT  --  42.3* 35.9*  --  30.5*  INR  --  4.29* 3.50  --  2.85  CREATININE 1.02*  --  0.68  --  0.66  CKTOTAL  --   --   --   --  82  TROPONINI  --   --   --  0.16*  --     Estimated Creatinine Clearance: 46.4 mL/min (by C-G formula based on SCr of 0.66 mg/dL).   Assessment:  Anticoag: Warfarin 5mg  daily for hx afib. INR on admission 3.38 and continued to rise until peaked at 4.68 on 2/11. Now INR down to 2.85 today. Hgb 11.1, plts 105 improved. No s/s of bleed.  - dose PTA 5mg /day.  Goal of Therapy:  INR 2-3 Monitor platelets by anticoagulation protocol: Yes   Plan:  Coumadin 2mg  po x 1 tonight Monitor daily INR, CBC, s/s of bleed   Samanvi Cuccia S. Merilynn Finland, PharmD, BCPS Clinical Staff Pharmacist Pager [phone]  Misty Stanley Stillinger 07/01/2016,11:39 AM

## 2016-07-02 DIAGNOSIS — I248 Other forms of acute ischemic heart disease: Secondary | ICD-10-CM

## 2016-07-02 LAB — CBC
HCT: 34.7 % — ABNORMAL LOW (ref 36.0–46.0)
Hemoglobin: 11.3 g/dL — ABNORMAL LOW (ref 12.0–15.0)
MCH: 30.1 pg (ref 26.0–34.0)
MCHC: 32.6 g/dL (ref 30.0–36.0)
MCV: 92.3 fL (ref 78.0–100.0)
Platelets: 114 10*3/uL — ABNORMAL LOW (ref 150–400)
RBC: 3.76 MIL/uL — ABNORMAL LOW (ref 3.87–5.11)
RDW: 17.6 % — AB (ref 11.5–15.5)
WBC: 7.7 10*3/uL (ref 4.0–10.5)

## 2016-07-02 LAB — BASIC METABOLIC PANEL
ANION GAP: 7 (ref 5–15)
BUN: 21 mg/dL — AB (ref 6–20)
CO2: 32 mmol/L (ref 22–32)
CREATININE: 0.67 mg/dL (ref 0.44–1.00)
Calcium: 8.7 mg/dL — ABNORMAL LOW (ref 8.9–10.3)
Chloride: 97 mmol/L — ABNORMAL LOW (ref 101–111)
GFR calc Af Amer: >60 mL/min (ref >60)
GLUCOSE: 85 mg/dL (ref 65–99)
Potassium: 4.2 mmol/L (ref 3.5–5.1)
Sodium: 136 mmol/L (ref 135–145)

## 2016-07-02 LAB — PROTIME-INR
INR: 2.52
Prothrombin Time: 27.7 seconds — ABNORMAL HIGH (ref 11.4–15.2)

## 2016-07-02 MED ORDER — WARFARIN SODIUM 3 MG PO TABS
3.0000 mg | ORAL_TABLET | Freq: Once | ORAL | Status: AC
  Administered 2016-07-02: 3 mg via ORAL
  Filled 2016-07-02: qty 1

## 2016-07-02 MED ORDER — TRAMADOL HCL 50 MG PO TABS
50.0000 mg | ORAL_TABLET | Freq: Four times a day (QID) | ORAL | Status: DC | PRN
  Filled 2016-07-02: qty 1

## 2016-07-02 NOTE — Care Management Important Message (Signed)
Important Message  Patient Details  Name: ADALEY CHOQUETTE MRN: [id number] Date of Birth: 07/17/1926   Medicare Important Message Given:  Yes    Don Giarrusso Stefan Church 07/02/2016, 11:41 AM

## 2016-07-02 NOTE — Discharge Instructions (Signed)
Information on my medicine - Coumadin®   (Warfarin) ° °This medication education was reviewed with me or my healthcare representative as part of my discharge preparation.  The pharmacist that spoke with me during my hospital stay was:  Gari Trovato Stillinger, RPH ° °Why was Coumadin prescribed for you? °Coumadin was prescribed for you because you have a blood clot or a medical condition that can cause an increased risk of forming blood clots. Blood clots can cause serious health problems by blocking the flow of blood to the heart, lung, or brain. Coumadin can prevent harmful blood clots from forming. °As a reminder your indication for Coumadin is:   Stroke Prevention Because Of Atrial Fibrillation ° °What test will check on my response to Coumadin? °While on Coumadin (warfarin) you will need to have an INR test regularly to ensure that your dose is keeping you in the desired range. The INR (international normalized ratio) number is calculated from the result of the laboratory test called prothrombin time (PT). ° °If an INR APPOINTMENT HAS NOT ALREADY BEEN MADE FOR YOU please schedule an appointment to have this lab work done by your health care provider within 7 days. °Your INR goal is usually a number between:  2 to 3 or your provider may give you a more narrow range like 2-2.5.  Ask your health care provider during an office visit what your goal INR is. ° °What  do you need to  know  About  COUMADIN? °Take Coumadin (warfarin) exactly as prescribed by your healthcare provider about the same time each day.  DO NOT stop taking without talking to the doctor who prescribed the medication.  Stopping without other blood clot prevention medication to take the place of Coumadin may increase your risk of developing a new clot or stroke.  Get refills before you run out. ° °What do you do if you miss a dose? °If you miss a dose, take it as soon as you remember on the same day then continue your regularly scheduled  regimen the next day.  Do not take two doses of Coumadin at the same time. ° °Important Safety Information °A possible side effect of Coumadin (Warfarin) is an increased risk of bleeding. You should call your healthcare provider right away if you experience any of the following: °  Bleeding from an injury or your nose that does not stop. °  Unusual colored urine (red or dark brown) or unusual colored stools (red or black). °  Unusual bruising for unknown reasons. °  A serious fall or if you hit your head (even if there is no bleeding). ° °Some foods or medicines interact with Coumadin® (warfarin) and might alter your response to warfarin. To help avoid this: °  Eat a balanced diet, maintaining a consistent amount of Vitamin K. °  Notify your provider about major diet changes you plan to make. °  Avoid alcohol or limit your intake to 1 drink for women and 2 drinks for men per day. °(1 drink is 5 oz. wine, 12 oz. beer, or 1.5 oz. liquor.) ° °Make sure that ANY health care provider who prescribes medication for you knows that you are taking Coumadin (warfarin).  Also make sure the healthcare provider who is monitoring your Coumadin knows when you have started a new medication including herbals and non-prescription products. ° °Coumadin® (Warfarin)  Major Drug Interactions  °Increased Warfarin Effect Decreased Warfarin Effect  °Alcohol (large quantities) °Antibiotics (esp. Septra/Bactrim, Flagyl, Cipro) °Amiodarone (Cordarone) °Aspirin (  ASA) °Cimetidine (Tagamet) °Megestrol (Megace) °NSAIDs (ibuprofen, naproxen, etc.) °Piroxicam (Feldene) °Propafenone (Rythmol SR) °Propranolol (Inderal) °Isoniazid (INH) °Posaconazole (Noxafil) Barbiturates (Phenobarbital) °Carbamazepine (Tegretol) °Chlordiazepoxide (Librium) °Cholestyramine (Questran) °Griseofulvin °Oral Contraceptives °Rifampin °Sucralfate (Carafate) °Vitamin K  ° °Coumadin® (Warfarin) Major Herbal Interactions  °Increased Warfarin Effect Decreased Warfarin Effect    °Garlic °Ginseng °Ginkgo biloba Coenzyme Q10 °Green tea °St. John’s wort   ° °Coumadin® (Warfarin) FOOD Interactions  °Eat a consistent number of servings per week of foods HIGH in Vitamin K °(1 serving = ½ cup)  °Collards (cooked, or boiled & drained) °Kale (cooked, or boiled & drained) °Mustard greens (cooked, or boiled & drained) °Parsley *serving size only = ¼ cup °Spinach (cooked, or boiled & drained) °Swiss chard (cooked, or boiled & drained) °Turnip greens (cooked, or boiled & drained)  °Eat a consistent number of servings per week of foods MEDIUM-HIGH in Vitamin K °(1 serving = 1 cup)  °Asparagus (cooked, or boiled & drained) °Broccoli (cooked, boiled & drained, or raw & chopped) °Brussel sprouts (cooked, or boiled & drained) *serving size only = ½ cup °Lettuce, raw (green leaf, endive, romaine) °Spinach, raw °Turnip greens, raw & chopped  ° °These websites have more information on Coumadin (warfarin):  www.coumadin.com; °www.ahrq.gov/consumer/coumadin.htm; ° ° ° °

## 2016-07-02 NOTE — Clinical Social Work Note (Signed)
Clinical Social Work Assessment  Patient Details  Name: Gabriela Hernandez MRN: [id number] Date of Birth: [birth year]  Date of referral:  07/02/16               Reason for consult:  Facility Placement, Discharge Planning                Permission sought to share information with:  Facility Sport and exercise psychologist, Family Supports Permission granted to share information::  Yes, Verbal Permission Granted  Name::     Richardson Landry  Agency::  SNF's  Relationship::  Son  Contact Information:  [phone]  Housing/Transportation Living arrangements for the past 2 months:  [place] of Information:  Patient, Medical Team, Adult Children, Other (Comment Required) Barrister's clerk) Patient Interpreter Needed:  None Criminal Activity/Legal Involvement Pertinent to Current Situation/Hospitalization:  No - Comment as needed Significant Relationships:  Adult Children, Other Family Members Lives with:  Self Do you feel safe going back to the place where you live?  No Need for family participation in patient care:  Yes (Comment)  Care giving concerns:  PT recommending SNF once medically stable for discharge.   Social Worker assessment / plan:  CSW met with patient. Nephew at bedside. CSW introduced role and explained that PT recommendations would be discussed. Patient is agreeable to SNF placement. First preference is [facility]. Patient's nephew had patient's son call CSW while in the room. Patient's son stated that he is patient's HCPOA. He confirmed interest in [facility] and is interested in long-term care following rehab because he says the patient is afraid to be at home alone with fall risk. Patient's son states that she has been paying into long-term care insurance. No further concerns. CSW encouraged patient and her family to contact CSW as needed. CSW will continue to follow patient and her family for support and facilitate discharge to SNF once medically stable. [place] notified that  they are first preference.  Employment status:  Retired Nurse, adult PT Recommendations:  [place] / Referral to community resources:  [place]  Patient/Family's Response to care:  Patient and her family agreeable to SNF placement. Patient's family supportive and involved in patient's care. Patient and her family appreciated social work intervention.  Patient/Family's Understanding of and Emotional Response to Diagnosis, Current Treatment, and Prognosis:  Patient and her family have a good understanding of the reason for admission. Patient and her family appear happy with hospital care.  Emotional Assessment Appearance:  Appears stated age Attitude/Demeanor/Rapport:  Other (Pleasant) Affect (typically observed):  Accepting, Appropriate, Calm, Pleasant Orientation:  Oriented to Self, Oriented to Place, Oriented to  Time, Oriented to Situation Alcohol / Substance use:  Never Used Psych involvement (Current and /or in the community):  No (Comment)  Discharge Needs  Concerns to be addressed:  Care Coordination Readmission within the last 30 days:  No Current discharge risk:  Dependent with Mobility, Lives alone Barriers to Discharge:  Continued Medical Work up   Candie Chroman, LCSW 07/02/2016, 11:21 AM

## 2016-07-02 NOTE — Progress Notes (Signed)
ANTICOAGULATION CONSULT NOTE - Follow Up Consult  Pharmacy Consult for Coumadin Indication: atrial fibrillation  No Known Allergies  Patient Measurements: Height: 5\' 6"  (167.6 cm) Weight: 135 lb 11.2 oz (61.6 kg) IBW/kg (Calculated) : 59.3 Heparin Dosing Weight:    Vital Signs: Temp: 98.2 F (36.8 C) (02/15 0602) Temp Source: Oral (02/15 0602) BP: 137/86 (02/15 0602) Pulse Rate: 61 (02/15 0602)  Labs:  Recent Labs  06/30/16 0519 06/30/16 1102 07/01/16 0341 07/02/16 0455  HGB 10.8*  --  11.1* 11.3*  HCT 32.6*  --  34.4* 34.7*  PLT 83*  --  105* 114*  LABPROT 35.9*  --  30.5* 27.7*  INR 3.50  --  2.85 2.52  CREATININE 0.68  --  0.66 0.67  CKTOTAL  --   --  82  --   TROPONINI  --  0.16*  --   --     Estimated Creatinine Clearance: 46.4 mL/min (by C-G formula based on SCr of 0.67 mg/dL).   Assessment:  Anticoag: Warfarin 5mg  daily for hx afib. INR on admission 3.38 and continued to rise until peaked at 4.68 on 2/11. Now INR down to 2.52 today. Hgb 11.3, plts 114 improved. No s/s of bleed. Some epistaxis: given Afrin - dose PTA 5mg /day.   Goal of Therapy:  INR 2-3 Monitor platelets by anticoagulation protocol: Yes   Plan:  Coumadin 3mg  po x 1 tonight Monitor daily INR, CBC, s/s of bleed   [name] S. Merilynn Finland, PharmD, BCPS Clinical Staff Pharmacist Pager [phone]  Misty Stanley Stillinger 07/02/2016,10:43 AM

## 2016-07-02 NOTE — NC FL2 (Signed)
[place] MEDICAID FL2 LEVEL OF CARE SCREENING TOOL     IDENTIFICATION  Patient Name: Gabriela Hernandez Birthdate: Feb 25, 1927 Sex: female Admission Date (Current Location): 06/27/2016  [facility] and [place] Number:  Producer, television/film/video and Address:  [facility] [place], [address], [place], [place] [address]      Provider Number: [id number]  Attending Physician Name and Address:  Dorothea Ogle, MD  Relative Name and Phone Number:       Current Level of Care: Hospital Recommended Level of Care: Skilled Nursing Facility Prior Approval Number:    Date Approved/Denied:   PASRR Number: [id number]  Discharge Plan: SNF    Current Diagnoses: Patient Active Problem List   Diagnosis Date Noted  . Epistaxis 06/30/2016  . Sepsis (HCC) secondary to Escherichia coli bacteremia 06/28/2016  . AKI (acute kidney injury) (HCC) 06/28/2016  . Demand ischemia (HCC) 06/28/2016  . Elevated troponin 06/28/2016  . Elevated CK 06/28/2016  . Rhabdomyolysis 06/28/2016  . Septic shock (HCC)   . Acute pulmonary edema (HCC)   . Acute respiratory failure with hypoxia (HCC)   . Dehydration 06/27/2016  . Diarrhea 06/27/2016  . Acute hyponatremia 06/27/2016  . Fall 06/27/2016  . Hypothyroidism 06/27/2016  . Hyperbilirubinemia 06/27/2016  . Hypotension 06/27/2016  . Encounter for therapeutic drug monitoring 06/14/2013  . Dyspnea 02/16/2013  . Fatigue 10/03/2012  . Chronic systolic CHF (congestive heart failure) (HCC) 04/05/2012  . Dilated cardiomyopathy (HCC)   . Hypertension   . Atrial fibrillation (HCC) 08/19/2010    Orientation RESPIRATION BLADDER Height & Weight     Self, Time, Situation, Place  Normal Continent Weight: 135 lb 11.2 oz (61.6 kg) Height:  5\' 6"  (167.6 cm)  BEHAVIORAL SYMPTOMS/MOOD NEUROLOGICAL BOWEL NUTRITION STATUS   (None)  (None) Continent Diet (Heart healthy)  AMBULATORY STATUS COMMUNICATION OF NEEDS Skin   Limited Assist Verbally Normal                        Personal Care Assistance Level of Assistance  Bathing, Feeding, Dressing Bathing Assistance: Limited assistance Feeding assistance: Independent Dressing Assistance: Limited assistance     Functional Limitations Info  Sight, Hearing, Speech Sight Info: Adequate Hearing Info: Adequate Speech Info: Adequate    SPECIAL CARE FACTORS FREQUENCY  PT (By licensed PT), Blood pressure     PT Frequency: 5 x week              Contractures Contractures Info: Not present    Additional Factors Info  Code Status, Allergies Code Status Info: DNR Allergies Info: NKDA           Current Medications (07/02/2016):  This is the current hospital active medication list Current Facility-Administered Medications  Medication Dose Route Frequency Provider Last Rate Last Dose  . 0.9 %  sodium chloride infusion   Intravenous Continuous Maryruth Bun Rama, MD 10 mL/hr at 06/28/16 0845    . acetaminophen (TYLENOL) tablet 650 mg  650 mg Oral Q6H PRN Isaiah Blakes, PA-C   650 mg at 07/01/16 2349   Or  . acetaminophen (TYLENOL) suppository 650 mg  650 mg Rectal Q6H PRN Isaiah Blakes, PA-C      . carvedilol (COREG) tablet 25 mg  25 mg Oral BID WC Isaiah Blakes, PA-C   25 mg at 07/02/16 5621  . cefTRIAXone (ROCEPHIN) 2 g in dextrose 5 % 50 mL IVPB  2 g Intravenous Q24H Maryruth Bun Rama, MD  2 g at 07/02/16 0832  . feeding supplement (ENSURE ENLIVE) (ENSURE ENLIVE) liquid 237 mL  237 mL Oral BID BM Christina P Rama, MD   237 mL at 07/02/16 1000  . furosemide (LASIX) tablet 20 mg  20 mg Oral QPM Peter M Swaziland, MD   20 mg at 07/01/16 1718  . furosemide (LASIX) tablet 40 mg  40 mg Oral Daily Peter M Swaziland, MD   40 mg at 07/02/16 7353  . levothyroxine (SYNTHROID, LEVOTHROID) tablet 112 mcg  112 mcg Oral QAC breakfast Maryruth Bun Rama, MD   112 mcg at 07/02/16 0555  . MEDLINE mouth rinse  15 mL Mouth Rinse BID Maryruth Bun Rama, MD   15 mL at 07/02/16 1000  . ondansetron  (ZOFRAN) tablet 4 mg  4 mg Oral Q6H PRN Isaiah Blakes, PA-C       Or  . ondansetron [facility]) injection 4 mg  4 mg Intravenous Q6H PRN Isaiah Blakes, PA-C      . oxymetazoline (AFRIN) 0.05 % nasal spray 1 spray  1 spray Each Nare BID PRN Maryruth Bun Rama, MD   1 spray at 06/30/16 1422  . polyethylene glycol (MIRALAX / GLYCOLAX) packet 17 g  17 g Oral Daily PRN Isaiah Blakes, PA-C      . sodium chloride flush (NS) 0.9 % injection 3 mL  3 mL Intravenous Q12H Isaiah Blakes, PA-C   3 mL at 07/02/16 0834  . traZODone (DESYREL) tablet 25 mg  25 mg Oral QHS PRN Isaiah Blakes, PA-C   25 mg at 07/01/16 2349  . warfarin (COUMADIN) tablet 3 mg  3 mg Oral ONCE-1800 Crystal [place], [facility]      . Warfarin - Pharmacist Dosing Inpatient   Does not apply q1800 [place], [facility]   1 each at 07/01/16 1800     Discharge Medications: Please see discharge summary for a list of discharge medications.  Relevant Imaging Results:  Relevant Lab Results:   Additional Information SS#: [id number]. Son is interested in long-term care following rehab. He says she has long-term care insurance.  Margarito Liner, LCSW

## 2016-07-02 NOTE — Progress Notes (Signed)
Progress Note    Gabriela Hernandez  NFA:[id number] DOB: Dec 28, 1926  DOA: 06/27/2016   PCP: Lupe Carney, MD   Brief Narrative:   Chief complaint: Follow-up diarrhea and weakness  Gabriela Hernandez is an 83 y.o. female with a PMH of atrial fibrillation on chronic Coumadin, dilated nonischemic cardiomyopathy with preserved EF per echo 05/20/16, hypothyroidism and hypertension who was admitted 06/27/16 with a chief complaint of diarrhea and weakness with reports of a fall. Upon initial evaluation in the ED, the patient was found to be hypotensive.  Assessment/Plan:   Principal Problem:   Sepsis secondary to Escherichia coli bacteremia/hypotension/UTI Blood pressure 71/37 on admission. Hypotension initially thought to be from hypovolemia. Fluid volume resuscitated with improved blood pressure overnight. 3/4 blood cultures growing Escherichia coli, so placed on ceftriaxone. Sepsis order set placed.  Critical care team consulted due to high risk of deterioration. Patient is now DO NOT RESUSCITATE. She responded well to diuretic therapy, and is less dyspneic today. Blood pressure remains stable.   Active Problems:   Acute respiratory failure with hypoxia/acute pulmonary edema Secondary to sepsis and acute on chronic CHF. Respiratory status better today. Continue supplemental oxygen.    Epistaxis Afrin ordered as needed, humidify oxygen. Now resolved.     Elevated CK/rhabdomyolysis secondary to fall  Elevated CK levels likely related to rhabdomyolysis from fall. Unable to hydrate secondary to heart failure.    Demand ischemia/troponin elevation/acute on chronic systolic CHF Suspect troponin elevation is secondary hypotension-induced demand ischemia. 12-lead EKG done on admission (personally reviewed) showed atrial fibrillation, And repeat 12-lead EKG showed aberrantly conducted complexes. Last 2-D echo done 05/20/16 and showed an EF of 55-60 percent. There was also a small pericardial effusion  identified, but no hemodynamic compromise. Unfortunately, soft blood pressure limits ability to diurese.     Thrombocytopenia Likely related to sepsis. DIC panel negative for schistocytes and fibrinogen levels normal. Improving. CBC in AM    Acute kidney injury Baseline creatinine is 0.5. Initial creatinine elevated, but creatinine has now improved and WNL.     Atrial fibrillation (HCC) Rate controlled on chronic Coumadin. INR supratherapeutic on admission. Coumadin per pharmacy.    Diarrhea GI pathogen panel negative.    Hyponatremia Likely from CHF physiology, improving.    Fall PT evaluation.    Hypothyroidism Continue Synthroid. TSH WNL.    Hyperbilirubinemia Bilirubin mildly elevated. CT of the abdomen showed cholelithiasis without complicating factors and no biliary dilatation. RUQ ultrasound also showed cholelithiasis with mild wall thickening but no other focal abnormalities. Bilirubin improving with hydration, continue to monitor. No reports of abdominal pain.    Severe protein calorie malnutrition/underweight Body mass index is 21.9 kg/m. Dietitian consulted    Family Communication/Anticipated D/C date and plan/Code Status   DVT prophylaxis: Coumadin ordered. Code Status: Full Code.  Family Communication: husband at the bedside. Disposition Plan: SNF in AM   Medical Consultants:    PCCM  Cardiology  Procedures:    None  Anti-Infectives:    Rocephin 06/28/16--->  Subjective:   NO concerns this AM except says she did not sleep much last night and feels weak this AM  Objective:    Vitals:   07/01/16 1215 07/01/16 2015 07/02/16 0602 07/02/16 1239  BP: (!) 112/49 105/61 137/86 113/74  Pulse: 69 67 61 74  Resp: 18 19 19 18   Temp: 97.5 F (36.4 C)  98.2 F (36.8 C) 97.8 F (36.6 C)  TempSrc: Oral Oral Oral Oral  SpO2: 99%  95% 96% 99%  Weight:      Height:        Intake/Output Summary (Last 24 hours) at 07/02/16 1853 Last data filed at  07/02/16 1754  Gross per 24 hour  Intake              963 ml  Output             5450 ml  Net            -4487 ml   Filed Weights   06/29/16 1843 06/30/16 0548 07/01/16 0532  Weight: 61.1 kg (134 lb 12.8 oz) 61.8 kg (136 lb 3.2 oz) 61.6 kg (135 lb 11.2 oz)    Exam: General exam: Appears calm.Looks better than labs/clinical scenario would suggest. Respiratory system: A a few crackles, labored breathing with tachypnea and use of accessory muscles. Cardiovascular system: S1 & S2 heard, RRR. + JVD,  rubs, gallops or clicks. No murmurs. Gastrointestinal system: Abdomen is nondistended, soft and nontender. No organomegaly or masses felt. Normal bowel sounds heard. Central nervous system: Alert and oriented. No focal neurological deficits. Extremities: No clubbing, or cyanosis. 3+ edema. Skin: Venous stasis dermatitis. Psychiatry: Judgement and insight appear normal. Mood & affect appropriate.   Data Reviewed:   I have personally reviewed following labs and imaging studies:  Labs: Basic Metabolic Panel:  Recent Labs Lab 06/27/16 1540  06/28/16 0428 06/28/16 0750 06/28/16 1349 06/30/16 0519 07/01/16 0341 07/02/16 0455  NA  --   < > 129* 129* 129* 133* 133* 136  K  --   < > 3.6 4.0 3.9 4.1 4.2 4.2  CL  --   < > 96* 100* 96* 97* 94* 97*  CO2  --   < > 22 22 21* 27 30 32  GLUCOSE  --   < > 99 99 147* 102* 96 85  BUN  --   < > 32* 32* 31* 26* 22* 21*  CREATININE  --   < > 1.05* 1.03* 1.02* 0.68 0.66 0.67  CALCIUM  --   < > 8.3* 8.5* 8.6* 8.9 8.9 8.7*  MG 1.7  --  2.2  --   --   --   --   --   < > = values in this interval not displayed.  Liver Function Tests:  Recent Labs Lab 06/27/16 1540 06/28/16 0428  AST 52* 52*  ALT 34 35  ALKPHOS 50 45  BILITOT 2.9* 1.8*  PROT 5.2* 4.7*  ALBUMIN 3.0* 2.5*   Coagulation profile  Recent Labs Lab 06/28/16 0940 06/29/16 0318 06/30/16 0519 07/01/16 0341 07/02/16 0455  INR 4.68* 4.29* 3.50 2.85 2.52    CBC:  Recent  Labs Lab 06/27/16 1429 06/28/16 0428 06/28/16 0940 06/30/16 0519 07/01/16 0341 07/02/16 0455  WBC 8.9 6.6  --  7.2 8.6 7.7  HGB 11.1* 10.2*  --  10.8* 11.1* 11.3*  HCT 32.9* 30.4*  --  32.6* 34.4* 34.7*  MCV 90.1 89.4  --  90.3 91.2 92.3  PLT 122* 82* 86* 83* 105* 114*   Cardiac Enzymes:  Recent Labs Lab 06/27/16 1540 06/27/16 1936 06/27/16 2252 06/28/16 0428 06/30/16 1102 07/01/16 0341  CKTOTAL 506*  --   --  654*  --  82  TROPONINI  --  0.03* 0.03* 0.14* 0.16*  --     Recent Labs Lab 06/27/16 1521 06/29/16 1116  GLUCAP 121* 221*   Sepsis Labs:  Recent Labs Lab 06/27/16 1450 06/27/16 1936 06/27/16 1940 06/28/16 0428  06/28/16 0940 06/28/16 1349 06/30/16 0519 07/01/16 0341 07/02/16 0455  PROCALCITON  --  6.63  --   --  7.08  --   --   --   --   WBC  --   --   --  6.6  --   --  7.2 8.6 7.7  LATICACIDVEN 2.52*  --  1.07  --  1.7 1.5  --   --   --    Microbiology Recent Results (from the past 240 hour(s))  Blood culture (routine x 2)     Status: Abnormal   Collection Time: 06/27/16  3:25 PM  Result Value Ref Range Status   Specimen Description BLOOD LEFT ANTECUBITAL  Final   Special Requests BOTTLES DRAWN AEROBIC ONLY 5CC  Final   Culture  Setup Time   Final    GRAM NEGATIVE RODS IN BOTH AEROBIC AND ANAEROBIC BOTTLES CRITICAL VALUE NOTED.  VALUE IS CONSISTENT WITH PREVIOUSLY REPORTED AND CALLED VALUE.    Culture (A)  Final    ESCHERICHIA COLI SUSCEPTIBILITIES PERFORMED ON PREVIOUS CULTURE WITHIN THE LAST 5 DAYS.    Report Status 06/30/2016 FINAL  Final  Blood culture (routine x 2)     Status: Abnormal   Collection Time: 06/27/16  3:38 PM  Result Value Ref Range Status   Specimen Description BLOOD RIGHT ANTECUBITAL  Final   Special Requests BOTTLES DRAWN AEROBIC AND ANAEROBIC 5CC  Final   Culture  Setup Time   Final    GRAM NEGATIVE RODS IN BOTH AEROBIC AND ANAEROBIC BOTTLES CRITICAL RESULT CALLED TO, READ BACK BY AND VERIFIED WITH: A  ANDERSON,PHARMD AT 0821 06/28/16 BY L BENFIELD    Culture ESCHERICHIA COLI (A)  Final   Report Status 06/30/2016 FINAL  Final   Organism ID, Bacteria ESCHERICHIA COLI  Final      Susceptibility   Escherichia coli - MIC*    AMPICILLIN >=32 RESISTANT Resistant     CEFAZOLIN <=4 SENSITIVE Sensitive     CEFEPIME <=1 SENSITIVE Sensitive     CEFTAZIDIME <=1 SENSITIVE Sensitive     CEFTRIAXONE <=1 SENSITIVE Sensitive     CIPROFLOXACIN <=0.25 SENSITIVE Sensitive     GENTAMICIN <=1 SENSITIVE Sensitive     IMIPENEM <=0.25 SENSITIVE Sensitive     TRIMETH/SULFA >=320 RESISTANT Resistant     AMPICILLIN/SULBACTAM 16 INTERMEDIATE Intermediate     PIP/TAZO <=4 SENSITIVE Sensitive     Extended ESBL NEGATIVE Sensitive     * ESCHERICHIA COLI  Blood Culture ID Panel (Reflexed)     Status: Abnormal   Collection Time: 06/27/16  3:38 PM  Result Value Ref Range Status   Enterococcus species NOT DETECTED NOT DETECTED Final   Listeria monocytogenes NOT DETECTED NOT DETECTED Final   Staphylococcus species NOT DETECTED NOT DETECTED Final   Staphylococcus aureus NOT DETECTED NOT DETECTED Final   Streptococcus species NOT DETECTED NOT DETECTED Final   Streptococcus agalactiae NOT DETECTED NOT DETECTED Final   Streptococcus pneumoniae NOT DETECTED NOT DETECTED Final   Streptococcus pyogenes NOT DETECTED NOT DETECTED Final   Acinetobacter baumannii NOT DETECTED NOT DETECTED Final   Enterobacteriaceae species DETECTED (A) NOT DETECTED Final    Comment: Enterobacteriaceae represent a large family of gram-negative bacteria, not a single organism. CRITICAL RESULT CALLED TO, READ BACK BY AND VERIFIED WITH: A ANDERSON,PHARMD AT 4098 06/28/16 BY L BENFIELD    Enterobacter cloacae complex NOT DETECTED NOT DETECTED Final   Escherichia coli DETECTED (A) NOT DETECTED Final    Comment: CRITICAL  RESULT CALLED TO, READ BACK BY AND VERIFIED WITH: A ANDERSON,PHARMD AT 0821 06/28/16 BY L BENFIELD    Klebsiella oxytoca NOT  DETECTED NOT DETECTED Final   Klebsiella pneumoniae NOT DETECTED NOT DETECTED Final   Proteus species NOT DETECTED NOT DETECTED Final   Serratia marcescens NOT DETECTED NOT DETECTED Final   Carbapenem resistance NOT DETECTED NOT DETECTED Final   Haemophilus influenzae NOT DETECTED NOT DETECTED Final   Neisseria meningitidis NOT DETECTED NOT DETECTED Final   Pseudomonas aeruginosa NOT DETECTED NOT DETECTED Final   Candida albicans NOT DETECTED NOT DETECTED Final   Candida glabrata NOT DETECTED NOT DETECTED Final   Candida krusei NOT DETECTED NOT DETECTED Final   Candida parapsilosis NOT DETECTED NOT DETECTED Final   Candida tropicalis NOT DETECTED NOT DETECTED Final  MRSA PCR Screening     Status: None   Collection Time: 06/27/16  8:28 PM  Result Value Ref Range Status   MRSA by PCR NEGATIVE NEGATIVE Final    Comment:        The GeneXpert MRSA Assay (FDA approved for NASAL specimens only), is one component of a comprehensive MRSA colonization surveillance program. It is not intended to diagnose MRSA infection nor to guide or monitor treatment for MRSA infections.   Gastrointestinal Panel by PCR , Stool     Status: None   Collection Time: 06/27/16  8:33 PM  Result Value Ref Range Status   Campylobacter species NOT DETECTED NOT DETECTED Final   Plesimonas shigelloides NOT DETECTED NOT DETECTED Final   Salmonella species NOT DETECTED NOT DETECTED Final   Yersinia enterocolitica NOT DETECTED NOT DETECTED Final   Vibrio species NOT DETECTED NOT DETECTED Final   Vibrio cholerae NOT DETECTED NOT DETECTED Final   Enteroaggregative E coli (EAEC) NOT DETECTED NOT DETECTED Final   Enteropathogenic E coli (EPEC) NOT DETECTED NOT DETECTED Final   Enterotoxigenic E coli (ETEC) NOT DETECTED NOT DETECTED Final   Shiga like toxin producing E coli (STEC) NOT DETECTED NOT DETECTED Final   Shigella/Enteroinvasive E coli (EIEC) NOT DETECTED NOT DETECTED Final   Cryptosporidium NOT DETECTED NOT  DETECTED Final   Cyclospora cayetanensis NOT DETECTED NOT DETECTED Final   Entamoeba histolytica NOT DETECTED NOT DETECTED Final   Giardia lamblia NOT DETECTED NOT DETECTED Final   Adenovirus F40/41 NOT DETECTED NOT DETECTED Final   Astrovirus NOT DETECTED NOT DETECTED Final   Norovirus GI/GII NOT DETECTED NOT DETECTED Final   Rotavirus A NOT DETECTED NOT DETECTED Final   Sapovirus (I, II, IV, and V) NOT DETECTED NOT DETECTED Final  C difficile quick scan w PCR reflex     Status: None   Collection Time: 06/27/16  8:33 PM  Result Value Ref Range Status   C Diff antigen NEGATIVE NEGATIVE Final   C Diff toxin NEGATIVE NEGATIVE Final   C Diff interpretation No C. difficile detected.  Final    Radiology: No results found.  Medications:   . carvedilol  25 mg Oral BID WC  . cefTRIAXone (ROCEPHIN)  IV  2 g Intravenous Q24H  . feeding supplement (ENSURE ENLIVE)  237 mL Oral BID BM  . furosemide  20 mg Oral QPM  . furosemide  40 mg Oral Daily  . levothyroxine  112 mcg Oral QAC breakfast  . mouth rinse  15 mL Mouth Rinse BID  . sodium chloride flush  3 mL Intravenous Q12H  . Warfarin - Pharmacist Dosing Inpatient   Does not apply q1800   Continuous Infusions: .  sodium chloride 10 mL/hr at 06/28/16 0845     LOS: 4 days   Debbora Presto  [facility] Pager [phone]. If unable to reach me by pager, please call my cell phone at [phone]  *Please refer to [email or web address], password [email or web address] to get updated schedule on who will round on this patient, as hospitalists switch teams weekly. If 7PM-7AM, please contact night-coverage at [email or web address], password [email or web address] for any overnight needs.  07/02/2016, 6:53 PM

## 2016-07-02 NOTE — Clinical Social Work Note (Signed)
CSW notified patient and her children of bed offer from [facility]. They have accepted. Patient's daughter will transport patient by car.  Charlynn Court, CSW [phone]

## 2016-07-02 NOTE — Progress Notes (Signed)
Progress Note  Patient Name: Gabriela Hernandez Date of Encounter: 07/02/2016  Primary Cardiologist: Dr. [place]  Subjective   Ambulated around her room yesterday afternoon. Still with significant dyspnea with exertion, orthopnea, and PND. Reports frequent urination with resumption of PO Lasix.    Inpatient Medications    Scheduled Meds: . carvedilol  25 mg Oral BID WC  . cefTRIAXone (ROCEPHIN)  IV  2 g Intravenous Q24H  . feeding supplement (ENSURE ENLIVE)  237 mL Oral BID BM  . furosemide  20 mg Oral QPM  . furosemide  40 mg Oral Daily  . levothyroxine  112 mcg Oral QAC breakfast  . mouth rinse  15 mL Mouth Rinse BID  . sodium chloride flush  3 mL Intravenous Q12H  . Warfarin - Pharmacist Dosing Inpatient   Does not apply q1800   Continuous Infusions: . sodium chloride 10 mL/hr at 06/28/16 0845   PRN Meds: acetaminophen **OR** acetaminophen, ondansetron **OR** ondansetron (ZOFRAN) IV, oxymetazoline, polyethylene glycol, traZODone   Vital Signs    Vitals:   07/01/16 0800 07/01/16 1215 07/01/16 2015 07/02/16 0602  BP: 128/75 (!) 112/49 105/61 137/86  Pulse: 69 69 67 61  Resp:  18 19 19   Temp: 97.9 F (36.6 C) 97.5 F (36.4 C)  98.2 F (36.8 C)  TempSrc: Oral Oral Oral Oral  SpO2: 100% 99% 95% 96%  Weight:      Height:        Intake/Output Summary (Last 24 hours) at 07/02/16 0903 Last data filed at 07/02/16 9604  Gross per 24 hour  Intake             1203 ml  Output             3500 ml  Net            -2297 ml   Filed Weights   06/29/16 1843 06/30/16 0548 07/01/16 0532  Weight: 134 lb 12.8 oz (61.1 kg) 136 lb 3.2 oz (61.8 kg) 135 lb 11.2 oz (61.6 kg)    Telemetry    Atrial fibrillation, HR in low-50's to 60's.  - Personally Reviewed  ECG    No new tracings.   Physical Exam   General: Well developed, elderly Caucasian female appearing in no acute distress. Head: Normocephalic, atraumatic.  Neck: Supple without bruits, JVD at 10cm. Lungs:  Resp  regular and unlabored, decreased breath sounds at bases bilaterally. Heart: Irregularly irregular, S1, S2, no S3, S4, or murmur; no rub. Abdomen: Soft, non-tender, non-distended with normoactive bowel sounds. No hepatomegaly. No rebound/guarding. No obvious abdominal masses. Extremities: No clubbing or cyanosis, 1+ pitting edema up to mid-shins bilaterally. Distal pedal pulses are 2+ bilaterally. Neuro: Alert and oriented X 3. Moves all extremities spontaneously. Psych: Normal affect.  Labs    Chemistry Recent Labs Lab 06/27/16 1540  06/28/16 0428  06/30/16 0519 07/01/16 0341 07/02/16 0455  NA  --   < > 129*  < > 133* 133* 136  K  --   < > 3.6  < > 4.1 4.2 4.2  CL  --   < > 96*  < > 97* 94* 97*  CO2  --   < > 22  < > 27 30 32  GLUCOSE  --   < > 99  < > 102* 96 85  BUN  --   < > 32*  < > 26* 22* 21*  CREATININE  --   < > 1.05*  < > 0.68 0.66  0.67  CALCIUM  --   < > 8.3*  < > 8.9 8.9 8.7*  PROT 5.2*  --  4.7*  --   --   --   --   ALBUMIN 3.0*  --  2.5*  --   --   --   --   AST 52*  --  52*  --   --   --   --   ALT 34  --  35  --   --   --   --   ALKPHOS 50  --  45  --   --   --   --   BILITOT 2.9*  --  1.8*  --   --   --   --   GFRNONAA  --   < > 46*  < > >60 >60 >60  GFRAA  --   < > 54*  < > >60 >60 >60  ANIONGAP  --   < > 11  < > 9 9 7   < > = values in this interval not displayed.   Hematology Recent Labs Lab 06/30/16 0519 07/01/16 0341 07/02/16 0455  WBC 7.2 8.6 7.7  RBC 3.61* 3.77* 3.76*  HGB 10.8* 11.1* 11.3*  HCT 32.6* 34.4* 34.7*  MCV 90.3 91.2 92.3  MCH 29.9 29.4 30.1  MCHC 33.1 32.3 32.6  RDW 17.5* 17.5* 17.6*  PLT 83* 105* 114*    Cardiac Enzymes Recent Labs Lab 06/27/16 1936 06/27/16 2252 06/28/16 0428 06/30/16 1102  TROPONINI 0.03* 0.03* 0.14* 0.16*    Recent Labs Lab 06/27/16 1449  TROPIPOC 0.06     BNPNo results for input(s): BNP, PROBNP in the last 168 hours.   DDimer  Recent Labs Lab 06/28/16 0940  DDIMER 2.09*     Radiology      No results found.  Cardiac Studies   Echocardiogram: 05/20/2016 Study Conclusions  - Left ventricle: Systolic function was normal. The estimated   ejection fraction was in the range of 55% to 60%. Wall motion was   normal; there were no regional wall motion abnormalities. - Mitral valve: There was mild to moderate regurgitation directed   centrally. - Left atrium: The atrium was massively dilated. - Right atrium: The atrium was mildly dilated. - Atrial septum: The septum bowed from left to right, consistent   with increased left atrial pressure. No defect or patent foramen   ovale was identified. - Pericardium, extracardiac: A small pericardial effusion was   identified posterior to the heart. There was no evidence of   hemodynamic compromise.  Patient Profile     83 y.o. female w/ PMH of NICM, mild-moderate MR, chronic atrial fibrillation (on Coumadin), HTN, and Hypothyroidism who presented to [facility] ED on 06/27/2016 for diarrhea, weakness, and falls. Admitted with Urosepsis. Cardiology consulted for possible NSVT on telemetry and CHF exacerbation.   Assessment & Plan    1. Possible VT - tele reviewed by Dr. Graciela Husbands on admission and NO VT, only afib with rate related aberrency.   2. Acute on chronic diastolic CHF/ History of Nonischemic Cardiomyopathy - recent echo in 05/2016 showed improved EF of 55-60% with no regional WMA, mild to moderate MR, massively dilated LA, mildly dilated LA.  - has been resumed on PO Lasix 40mg  in AM, 20mg  in PM. Weight at 126 lbs in 04/2016, at 125 lbs on admission, up to 135 lbs today having received IVF this admission in the setting of sepsis. -1.9L yesterday with resumption of Lasix.  3. HTN - BP at 105/49 - 137/86 in the past 24 hours. - continue current mediation regimen.  4. Chronic afib with a history of bradycardia - rate well controlled currently. Would not further titrate Coreg with HR in the low-50's at times.  - On Coumadin  for CHADSVASC of at least 5 (CHF, age, HTN, F sex). INR 2.52 this AM. Appreciate Pharmacy's assistance with dosing.   5. Sepsis//E Coli 2/2 UTI - remains on Ceftriaxone. - per admitting team  6. Thrombocytopenia - trough of 83K, improved to 114K this AM.   7. Elevated Troponin - values at 0.03, 0.14, and 0.16. Unclear why these were obtained.  - no plans for ischemic evaluation.   Lorri Frederick , PA-C 9:03 AM 07/02/2016 Pager: [phone] Patient seen and examined and history reviewed. Agree with above findings and plan. Excellent urine output on oral lasix. Edema is less. Serum sodium improved. She is still weak and deconditioned. Plan to DC to Rehab facility in am. Please call with further questions.  Krystiana Fornes [place], MDFACC 07/02/2016 3:43 PM

## 2016-07-02 NOTE — Clinical Social Work Placement (Signed)
   CLINICAL SOCIAL WORK PLACEMENT  NOTE  Date:  07/02/2016  Patient Details  Name: Gabriela Hernandez MRN: [id number] Date of Birth: 1926-11-08  Clinical Social Work is seeking post-discharge placement for this patient at the Skilled  Nursing Facility level of care (*CSW will initial, date and re-position this form in  chart as items are completed):  Yes   Patient/family provided with [place] Clinical Social Work Department's list of facilities offering this level of care within the geographic area requested by the patient (or if unable, by the patient's family).  Yes   Patient/family informed of their freedom to choose among providers that offer the needed level of care, that participate in Medicare, Medicaid or managed care program needed by the patient, have an available bed and are willing to accept the patient.  Yes   Patient/family informed of [place]'s ownership interest in [facility] and [facility], as well as of the fact that they are under no obligation to receive care at these facilities.  PASRR submitted to EDS on 07/02/16     PASRR number received on 07/02/16     Existing PASRR number confirmed on       FL2 transmitted to all facilities in geographic area requested by pt/family on 07/02/16     FL2 transmitted to all facilities within larger geographic area on       Patient informed that his/her managed care company has contracts with or will negotiate with certain facilities, including the following:            Patient/family informed of bed offers received.  Patient chooses bed at       Physician recommends and patient chooses bed at      Patient to be transferred to   on  .  Patient to be transferred to facility by       Patient family notified on   of transfer.  Name of family member notified:        PHYSICIAN Please sign FL2, Please sign DNR     Additional Comment:    _______________________________________________ Margarito Liner,  LCSW 07/02/2016, 11:25 AM

## 2016-07-03 ENCOUNTER — Inpatient Hospital Stay (HOSPITAL_COMMUNITY): Payer: Medicare Other

## 2016-07-03 LAB — CBC
HCT: 37 % (ref 36.0–46.0)
HEMOGLOBIN: 11.7 g/dL — AB (ref 12.0–15.0)
MCH: 29.5 pg (ref 26.0–34.0)
MCHC: 31.6 g/dL (ref 30.0–36.0)
MCV: 93.4 fL (ref 78.0–100.0)
Platelets: 139 10*3/uL — ABNORMAL LOW (ref 150–400)
RBC: 3.96 MIL/uL (ref 3.87–5.11)
RDW: 17.6 % — ABNORMAL HIGH (ref 11.5–15.5)
WBC: 9.8 10*3/uL (ref 4.0–10.5)

## 2016-07-03 LAB — BASIC METABOLIC PANEL
ANION GAP: 8 (ref 5–15)
BUN: 16 mg/dL (ref 6–20)
CHLORIDE: 93 mmol/L — AB (ref 101–111)
CO2: 36 mmol/L — ABNORMAL HIGH (ref 22–32)
Calcium: 9 mg/dL (ref 8.9–10.3)
Creatinine, Ser: 0.66 mg/dL (ref 0.44–1.00)
GFR calc Af Amer: >60 mL/min (ref >60)
GLUCOSE: 91 mg/dL (ref 65–99)
POTASSIUM: 4.3 mmol/L (ref 3.5–5.1)
SODIUM: 137 mmol/L (ref 135–145)

## 2016-07-03 LAB — PROTIME-INR
INR: 2.66
Prothrombin Time: 28.9 seconds — ABNORMAL HIGH (ref 11.4–15.2)

## 2016-07-03 MED ORDER — TRAMADOL HCL 50 MG PO TABS: 50.0000 mg | ORAL_TABLET | Freq: Four times a day (QID) | ORAL | 0 refills | Status: DC | PRN

## 2016-07-03 MED ORDER — CEFUROXIME AXETIL 500 MG PO TABS: 500.0000 mg | ORAL_TABLET | Freq: Two times a day (BID) | ORAL | Status: DC

## 2016-07-03 MED ORDER — DEXTROSE 5 % IV SOLN: 2.0000 g | INTRAVENOUS | 0 refills | Status: DC

## 2016-07-03 MED ORDER — CEFUROXIME AXETIL 500 MG PO TABS: 500.0000 mg | ORAL_TABLET | Freq: Two times a day (BID) | ORAL | 0 refills | Status: DC

## 2016-07-03 MED ORDER — WARFARIN SODIUM 3 MG PO TABS: 3.0000 mg | ORAL_TABLET | Freq: Once | ORAL | Status: DC

## 2016-07-03 NOTE — Discharge Summary (Signed)
Physician Discharge Summary  Gabriela Hernandez ONG:[id number] DOB: 04-Feb-1927 DOA: 06/27/2016  PCP: Lupe Carney, MD  Admit date: 06/27/2016 Discharge date: 07/03/2016  Recommendations for Outpatient Follow-up:  1. Pt will need to follow up with PCP in 1 week post discharge 2. Please obtain BMP to evaluate electrolytes and kidney function 3. Please also check CBC to evaluate Hg and Hct levels 4. Please continue Ceftin until 07/11/2016 5. Please also check PT/INR in 1-2 days and readjust the dose of Coumadin as clinically indicated   Discharge Diagnoses:  Principal Problem:   Hypotension Active Problems:   Atrial fibrillation (HCC)   Dehydration   Diarrhea   Acute hyponatremia  Discharge Condition: Stable  Diet recommendation: Heart healthy diet discussed in details    Brief Narrative:   83 y.o. female with a PMH of atrial fibrillation on chronic Coumadin, dilated nonischemic cardiomyopathy with preserved EF per echo 05/20/16, hypothyroidism and hypertension who was admitted 06/27/16 with a chief complaint of diarrhea and weakness with reports of a fall. Upon initial evaluation in the ED, the patient was found to be hypotensive.  Assessment/Plan:   Principal Problem:   Sepsis secondary to Escherichia coli bacteremia/hypotension/UTI Blood pressure 71/37 on admission. Hypotension initially thought to be from hypovolemia. Fluid volume resuscitated with improved blood pressure overnight. 3/4 blood cultures growing Escherichia coli, so placed on ceftriaxone. Sepsis order set placed.  Critical care team consulted due to high risk of deterioration. No further recommendations, pt more alert this AM and feels ready to go. Patient is now DO NOT RESUSCITATE. She responded well to diuretic therapy, and is less dyspneic today. Rocephin changed to Ceftin on discharge, complete therapy, until 07/11/2016  Active Problems:   Acute respiratory failure with hypoxia/acute pulmonary edema Secondary to  sepsis and acute on chronic CHF. Respiratory status better today. Repeat CXR piror to discharge to ensure stability.     Epistaxis Afrin ordered as needed, humidify oxygen. Now resolved.     Elevated CK/rhabdomyolysis secondary to fall Elevated CK levels likely related to rhabdomyolysis from fall. Unable to hydrate secondary to heart failure.    Demand ischemia/troponin elevation/acute on chronic systolic CHF Suspect troponin elevation is secondary hypotension-induced demand ischemia. 12-lead EKG done on admission (personally reviewed) showed atrial fibrillation, And repeat 12-lead EKG showed aberrantly conducted complexes. Last 2-D echo done 05/20/16 and showed an EF of 55-60 percent. There was also a small pericardial effusion identified, but no hemodynamic compromise. Lasix dose and frequency outlined below, this was per cardiology team recommendations.      Thrombocytopenia Likely related to sepsis. DIC panel negative for schistocytes and fibrinogen levels normal. Improving.    Acute kidney injury Baseline creatinine is 0.5. Initial creatinine elevated, but creatinine has now improved and WNL.     Atrial fibrillation (HCC) Rate controlled on chronic Coumadin. INR supratherapeutic on admission. Coumadin dose to be readjust based on PT/INR. Currently, INR is at target range.     Diarrhea GI pathogen panel negative.    Hyponatremia Likely from CHF physiology, improved and resolved     Fall PT evaluation. SNF recommended and plan to discharge today     Hypothyroidism Continue Synthroid. TSH WNL.    Hyperbilirubinemia Bilirubin mildly elevated. CT of the abdomen showed cholelithiasis without complicating factors and no biliary dilatation. RUQ ultrasound also showed cholelithiasis with mild wall thickening but no other focal abnormalities. Bilirubin improved with hydration    Severe protein calorie malnutrition/underweight Body mass index is 21.9 kg/m.    Family  Communication/Anticipated D/C date and plan/Code Status   DVT prophylaxis: Coumadin  Code Status: DNR Family Communication: daughter at the bedside. Disposition Plan: SNF    Medical Consultants:    PCCM  Cardiology  Procedures:    None  Anti-Infectives:    Rocephin 06/28/16---> change to Ceftin on discharge and complete until 07/11/2016  Procedures/Studies: Ct Abdomen Pelvis Wo Contrast  Result Date: 06/27/2016 CLINICAL DATA:  Hyperbilirubinemia EXAM: CT ABDOMEN AND PELVIS WITHOUT CONTRAST TECHNIQUE: Multidetector CT imaging of the abdomen and pelvis was performed following the standard protocol without IV contrast. COMPARISON:  Ultrasound from earlier in the same day FINDINGS: Lower chest: Mild atelectatic changes are noted in the left lung base. Pectus excavatum is seen. The cardiac shadow is enlarged and somewhat distorted by the pectus deformity. Hepatobiliary: The liver shows a small hypodensity consistent with cysts. This was not well appreciated on the recent ultrasound. The gallbladder is well distended with cholelithiasis. No significant pericholecystic fluid is seen. Pancreas: Unremarkable. No pancreatic ductal dilatation or surrounding inflammatory changes. Spleen: Normal in size without focal abnormality. Adrenals/Urinary Tract: The adrenal glands are within normal limits. The right kidney demonstrates a prominent extrarenal pelvis although no obstructive changes are seen. The left kidney is partially pelvic in location. No obstructive changes or calculi are seen. Stomach/Bowel: Diverticulosis of the colon is noted without evidence of diverticulitis. The appendix is not well visualized consistent with the surgical history. Vascular/Lymphatic: Aortic atherosclerosis. No enlarged abdominal or pelvic lymph nodes. Reproductive: Uterus and bilateral adnexa are unremarkable. Other: No abdominal wall hernia or abnormality. No abdominopelvic ascites. Musculoskeletal:  Degenerative changes of lumbar spine are seen. IMPRESSION: Cholelithiasis without complicating factors. No biliary dilatation is seen. Hepatic cyst. No acute abnormality noted. Electronically Signed   By: Alcide Clever M.D.   On: 06/27/2016 18:32   Dg Chest 2 View  Result Date: 06/27/2016 CLINICAL DATA:  Hypotension. EXAM: CHEST  2 VIEW COMPARISON:  None 07/07/2013 FINDINGS: Marked cardiomegaly. No confluent airspace opacities, effusions or edema. No acute bony abnormality. IMPRESSION: Marked cardiomegaly.  No active disease. Electronically Signed   By: Charlett Nose M.D.   On: 06/27/2016 16:17   Ct Head Wo Contrast  Result Date: 06/27/2016 CLINICAL DATA:  Fall, posterior head injury EXAM: CT HEAD WITHOUT CONTRAST TECHNIQUE: Contiguous axial images were obtained from the base of the skull through the vertex without intravenous contrast. COMPARISON:  03/08/2015 FINDINGS: Brain: No evidence of acute infarction, hemorrhage, hydrocephalus, extra-axial collection or mass lesion/mass effect. Global cortical atrophy. Subcortical white matter and periventricular small vessel ischemic changes. Vascular: No hyperdense vessel or unexpected calcification. Skull: Normal. Negative for fracture or focal lesion. Sinuses/Orbits: Partial opacification of the left frontal sinus. Visualized paranasal sinuses the mastoid air cells are otherwise clear. Other: None. IMPRESSION: No evidence of acute intracranial abnormality. Atrophy with small vessel ischemic changes. Electronically Signed   By: Charline Bills M.D.   On: 06/27/2016 16:47   Dg Chest Port 1 View  Result Date: 06/28/2016 CLINICAL DATA:  Dyspnea EXAM: PORTABLE CHEST 1 VIEW COMPARISON:  06/27/2016 FINDINGS: Cardiomegaly.  No frank interstitial edema. Left lung base is obscured.  No definite pleural effusion. No pneumothorax. IMPRESSION: Cardiomegaly.  No frank interstitial edema. Electronically Signed   By: Charline Bills M.D.   On: 06/28/2016 17:49   US  Abdomen Limited Ruq  Result Date: 06/27/2016 CLINICAL DATA:  Elevated bilirubin EXAM: US ABDOMEN LIMITED - RIGHT UPPER QUADRANT COMPARISON:  None. FINDINGS: Gallbladder: Gallbladder is well distended with multiple stones. Mild  gallbladder wall thickening is noted. No pericholecystic fluid is seen. Common bile duct: Diameter: 3 mm. Liver: Mildly heterogeneous although no focal lesion is seen. No definitive biliary obstruction is noted. IMPRESSION: Cholelithiasis with mild wall thickening. No other focal abnormality is seen. Electronically Signed   By: Alcide Clever M.D.   On: 06/27/2016 18:19     Discharge Exam: Vitals:   07/02/16 2011 07/03/16 0545  BP: (!) 106/54 119/63  Pulse: 67 73  Resp: 18 18  Temp: 97.6 F (36.4 C) 97.6 F (36.4 C)   Vitals:   07/02/16 0602 07/02/16 1239 07/02/16 2011 07/03/16 0545  BP: 137/86 113/74 (!) 106/54 119/63  Pulse: 61 74 67 73  Resp: 19 18 18 18   Temp: 98.2 F (36.8 C) 97.8 F (36.6 C) 97.6 F (36.4 C) 97.6 F (36.4 C)  TempSrc: Oral Oral Oral Oral  SpO2: 96% 99% 97% 96%  Weight:    60 kg (132 lb 4.8 oz)  Height:        General: Pt is alert, follows commands appropriately, not in acute distress Cardiovascular: IRRR, no rubs, no gallops Respiratory: Clear to auscultation bilaterally, no wheezing, no crackles, no rhonchi Abdominal: Soft, non tender, non distended, bowel sounds +, no guarding  Discharge Instructions  Discharge Instructions    Diet - low sodium heart healthy    Complete by:  As directed    Increase activity slowly    Complete by:  As directed      Allergies as of 07/03/2016   No Known Allergies     Medication List    TAKE these medications   carvedilol 25 MG tablet Commonly known as:  COREG TAKE 1/2 TABLET BY MOUTH TWICE DAILY WITH A MEAL   cefUROXime 500 MG tablet Commonly known as:  CEFTIN Take 1 tablet (500 mg total) by mouth 2 (two) times daily with a meal.   furosemide 20 MG tablet Commonly known as:   LASIX Take 2 tablets (40 mg total) by mouth every morning and take 1 tablet (20 mg total) by mouth every evening.   levothyroxine 112 MCG tablet Commonly known as:  SYNTHROID, LEVOTHROID Take 112 mcg by mouth daily before breakfast.   multivitamin with minerals tablet Take 1 tablet by mouth daily.   quinapril 20 MG tablet Commonly known as:  ACCUPRIL Take 0.5 tablets (10 mg total) by mouth daily.   traMADol 50 MG tablet Commonly known as:  ULTRAM Take 1 tablet (50 mg total) by mouth every 6 (six) hours as needed (pain unrelieved by tylenol).   warfarin 5 MG tablet Commonly known as:  COUMADIN Take 1 tablet (5 mg total) by mouth as directed. What changed:  when to take this          Contact information for follow-up providers    Lupe Carney, MD Follow up.   Specialty:  Family Medicine Contact information: [address] [place] [address] [phone]            Contact information for after-discharge care    Destination    [facility] Follow up.   Specialty:  Skilled Nursing Facility Contact information: [address] [place] [address] [phone]                   The results of significant diagnostics from this hospitalization (including imaging, microbiology, ancillary and laboratory) are listed below for reference.     Microbiology: Recent Results (from the past 240  hour(s))  Blood culture (routine x 2)     Status: Abnormal   Collection Time: 06/27/16  3:25 PM  Result Value Ref Range Status   Specimen Description BLOOD LEFT ANTECUBITAL  Final   Special Requests BOTTLES DRAWN AEROBIC ONLY 5CC  Final   Culture  Setup Time   Final    GRAM NEGATIVE RODS IN BOTH AEROBIC AND ANAEROBIC BOTTLES CRITICAL VALUE NOTED.  VALUE IS CONSISTENT WITH PREVIOUSLY REPORTED AND CALLED VALUE.    Culture (A)  Final    ESCHERICHIA COLI SUSCEPTIBILITIES PERFORMED ON PREVIOUS CULTURE WITHIN THE LAST 5 DAYS.     Report Status 06/30/2016 FINAL  Final  Blood culture (routine x 2)     Status: Abnormal   Collection Time: 06/27/16  3:38 PM  Result Value Ref Range Status   Specimen Description BLOOD RIGHT ANTECUBITAL  Final   Special Requests BOTTLES DRAWN AEROBIC AND ANAEROBIC 5CC  Final   Culture  Setup Time   Final    GRAM NEGATIVE RODS IN BOTH AEROBIC AND ANAEROBIC BOTTLES CRITICAL RESULT CALLED TO, READ BACK BY AND VERIFIED WITH: A ANDERSON,PHARMD AT 0821 06/28/16 BY L BENFIELD    Culture ESCHERICHIA COLI (A)  Final   Report Status 06/30/2016 FINAL  Final   Organism ID, Bacteria ESCHERICHIA COLI  Final      Susceptibility   Escherichia coli - MIC*    AMPICILLIN >=32 RESISTANT Resistant     CEFAZOLIN <=4 SENSITIVE Sensitive     CEFEPIME <=1 SENSITIVE Sensitive     CEFTAZIDIME <=1 SENSITIVE Sensitive     CEFTRIAXONE <=1 SENSITIVE Sensitive     CIPROFLOXACIN <=0.25 SENSITIVE Sensitive     GENTAMICIN <=1 SENSITIVE Sensitive     IMIPENEM <=0.25 SENSITIVE Sensitive     TRIMETH/SULFA >=320 RESISTANT Resistant     AMPICILLIN/SULBACTAM 16 INTERMEDIATE Intermediate     PIP/TAZO <=4 SENSITIVE Sensitive     Extended ESBL NEGATIVE Sensitive     * ESCHERICHIA COLI  Blood Culture ID Panel (Reflexed)     Status: Abnormal   Collection Time: 06/27/16  3:38 PM  Result Value Ref Range Status   Enterococcus species NOT DETECTED NOT DETECTED Final   Listeria monocytogenes NOT DETECTED NOT DETECTED Final   Staphylococcus species NOT DETECTED NOT DETECTED Final   Staphylococcus aureus NOT DETECTED NOT DETECTED Final   Streptococcus species NOT DETECTED NOT DETECTED Final   Streptococcus agalactiae NOT DETECTED NOT DETECTED Final   Streptococcus pneumoniae NOT DETECTED NOT DETECTED Final   Streptococcus pyogenes NOT DETECTED NOT DETECTED Final   Acinetobacter baumannii NOT DETECTED NOT DETECTED Final   Enterobacteriaceae species DETECTED (A) NOT DETECTED Final    Comment: Enterobacteriaceae represent a  large family of gram-negative bacteria, not a single organism. CRITICAL RESULT CALLED TO, READ BACK BY AND VERIFIED WITH: A ANDERSON,PHARMD AT 3524 06/28/16 BY L BENFIELD    Enterobacter cloacae complex NOT DETECTED NOT DETECTED Final   Escherichia coli DETECTED (A) NOT DETECTED Final    Comment: CRITICAL RESULT CALLED TO, READ BACK BY AND VERIFIED WITH: A ANDERSON,PHARMD AT 8185 06/28/16 BY L BENFIELD    Klebsiella oxytoca NOT DETECTED NOT DETECTED Final   Klebsiella pneumoniae NOT DETECTED NOT DETECTED Final   Proteus species NOT DETECTED NOT DETECTED Final   Serratia marcescens NOT DETECTED NOT DETECTED Final   Carbapenem resistance NOT DETECTED NOT DETECTED Final   Haemophilus influenzae NOT DETECTED NOT DETECTED Final   Neisseria meningitidis NOT DETECTED NOT DETECTED Final   Pseudomonas aeruginosa  NOT DETECTED NOT DETECTED Final   Candida albicans NOT DETECTED NOT DETECTED Final   Candida glabrata NOT DETECTED NOT DETECTED Final   Candida krusei NOT DETECTED NOT DETECTED Final   Candida parapsilosis NOT DETECTED NOT DETECTED Final   Candida tropicalis NOT DETECTED NOT DETECTED Final  MRSA PCR Screening     Status: None   Collection Time: 06/27/16  8:28 PM  Result Value Ref Range Status   MRSA by PCR NEGATIVE NEGATIVE Final    Comment:        The GeneXpert MRSA Assay (FDA approved for NASAL specimens only), is one component of a comprehensive MRSA colonization surveillance program. It is not intended to diagnose MRSA infection nor to guide or monitor treatment for MRSA infections.   Gastrointestinal Panel by PCR , Stool     Status: None   Collection Time: 06/27/16  8:33 PM  Result Value Ref Range Status   Campylobacter species NOT DETECTED NOT DETECTED Final   Plesimonas shigelloides NOT DETECTED NOT DETECTED Final   Salmonella species NOT DETECTED NOT DETECTED Final   Yersinia enterocolitica NOT DETECTED NOT DETECTED Final   Vibrio species NOT DETECTED NOT DETECTED  Final   Vibrio cholerae NOT DETECTED NOT DETECTED Final   Enteroaggregative E coli (EAEC) NOT DETECTED NOT DETECTED Final   Enteropathogenic E coli (EPEC) NOT DETECTED NOT DETECTED Final   Enterotoxigenic E coli (ETEC) NOT DETECTED NOT DETECTED Final   Shiga like toxin producing E coli (STEC) NOT DETECTED NOT DETECTED Final   Shigella/Enteroinvasive E coli (EIEC) NOT DETECTED NOT DETECTED Final   Cryptosporidium NOT DETECTED NOT DETECTED Final   Cyclospora cayetanensis NOT DETECTED NOT DETECTED Final   Entamoeba histolytica NOT DETECTED NOT DETECTED Final   Giardia lamblia NOT DETECTED NOT DETECTED Final   Adenovirus F40/41 NOT DETECTED NOT DETECTED Final   Astrovirus NOT DETECTED NOT DETECTED Final   Norovirus GI/GII NOT DETECTED NOT DETECTED Final   Rotavirus A NOT DETECTED NOT DETECTED Final   Sapovirus (I, II, IV, and V) NOT DETECTED NOT DETECTED Final  C difficile quick scan w PCR reflex     Status: None   Collection Time: 06/27/16  8:33 PM  Result Value Ref Range Status   C Diff antigen NEGATIVE NEGATIVE Final   C Diff toxin NEGATIVE NEGATIVE Final   C Diff interpretation No C. difficile detected.  Final     Labs: Basic Metabolic Panel:  Recent Labs Lab 06/27/16 1540  06/28/16 0428  06/28/16 1349 06/30/16 0519 07/01/16 0341 07/02/16 0455 07/03/16 0508  NA  --   < > 129*  < > 129* 133* 133* 136 137  K  --   < > 3.6  < > 3.9 4.1 4.2 4.2 4.3  CL  --   < > 96*  < > 96* 97* 94* 97* 93*  CO2  --   < > 22  < > 21* 27 30 32 36*  GLUCOSE  --   < > 99  < > 147* 102* 96 85 91  BUN  --   < > 32*  < > 31* 26* 22* 21* 16  CREATININE  --   < > 1.05*  < > 1.02* 0.68 0.66 0.67 0.66  CALCIUM  --   < > 8.3*  < > 8.6* 8.9 8.9 8.7* 9.0  MG 1.7  --  2.2  --   --   --   --   --   --   < > =  values in this interval not displayed. Liver Function Tests:  Recent Labs Lab 06/27/16 1540 06/28/16 0428  AST 52* 52*  ALT 34 35  ALKPHOS 50 45  BILITOT 2.9* 1.8*  PROT 5.2* 4.7*  ALBUMIN  3.0* 2.5*   CBC:  Recent Labs Lab 06/28/16 0428 06/28/16 0940 06/30/16 0519 07/01/16 0341 07/02/16 0455 07/03/16 0508  WBC 6.6  --  7.2 8.6 7.7 9.8  HGB 10.2*  --  10.8* 11.1* 11.3* 11.7*  HCT 30.4*  --  32.6* 34.4* 34.7* 37.0  MCV 89.4  --  90.3 91.2 92.3 93.4  PLT 82* 86* 83* 105* 114* 139*   Cardiac Enzymes:  Recent Labs Lab 06/27/16 1540 06/27/16 1936 06/27/16 2252 06/28/16 0428 06/30/16 1102 07/01/16 0341  CKTOTAL 506*  --   --  654*  --  82  TROPONINI  --  0.03* 0.03* 0.14* 0.16*  --    BNP (last 3 results)  Recent Labs  06/05/16 1151  BNP 116.0*   CBG:  Recent Labs Lab 06/27/16 1521 06/29/16 1116  GLUCAP 121* 221*   SIGNED: Time coordinating discharge: 30 minutes  MAGICK-Azim Gillingham, MD  [facility] 07/03/2016, 11:27 AM Pager [phone]  If 7PM-7AM, please contact night-coverage [email or web address] Password [email or web address]

## 2016-07-03 NOTE — Progress Notes (Signed)
ANTICOAGULATION CONSULT NOTE - Follow Up Consult  Pharmacy Consult for Coumadin Indication: atrial fibrillation  No Known Allergies  Patient Measurements: Height: 5\' 6"  (167.6 cm) Weight: 132 lb 4.8 oz (60 kg) IBW/kg (Calculated) : 59.3  Vital Signs: Temp: 97.6 F (36.4 C) (02/16 0545) Temp Source: Oral (02/16 0545) BP: 119/63 (02/16 0545) Pulse Rate: 73 (02/16 0545)  Labs:  Recent Labs  07/01/16 0341 07/02/16 0455 07/03/16 0508  HGB 11.1* 11.3* 11.7*  HCT 34.4* 34.7* 37.0  PLT 105* 114* 139*  LABPROT 30.5* 27.7* 28.9*  INR 2.85 2.52 2.66  CREATININE 0.66 0.67 0.66  CKTOTAL 82  --   --     Estimated Creatinine Clearance: 46.4 mL/min (by C-G formula based on SCr of 0.66 mg/dL).   Assessment: 83 yo F admitted with fall, weakness, and diarrhea.  Found to have E.coli bacteremia and started on IV antibiotics.    Pt was on Coumadin 5mg  daily PTA for hx afib. INR on admission 3.38 and continued to rise until peaked at 4.68 on 2/11. INR remains therapeutic today on reduced Coumadin dose.    Goal of Therapy:  INR 2-3 Monitor platelets by anticoagulation protocol: Yes   Plan:  Coumadin 3mg  po x 1 tonight Monitor daily INR, CBC, s/s of bleed   [employer], Pharm.D., BCPS Clinical Pharmacist Pager [phone] 07/03/2016 11:18 AM

## 2016-07-03 NOTE — Progress Notes (Signed)
On assessment, pt is alert and oriented, and in no acute distress.  Needs addressed.  Education given and documented on Plan of Care. Pt has no further questions.

## 2016-07-03 NOTE — Clinical Social Work Note (Signed)
CSW facilitated patient discharge including contacting patient family and facility to confirm patient discharge plans. Clinical information faxed to facility and family agreeable with plan. Patient's daughter will transport her to [facility] after lunch. RN to call report prior to discharge [phone]).  CSW will sign off for now as social work intervention is no longer needed. Please consult [place] again if new needs arise.  Charlynn Court, CSW [phone]

## 2016-07-03 NOTE — Care Management Important Message (Signed)
Important Message  Patient Details  Name: STACYE NEALL MRN: [id number] Date of Birth: 28-Jul-1926   Medicare Important Message Given:  Yes    Nikoleta Dady 07/03/2016, 1:12 PM

## 2016-07-03 NOTE — Progress Notes (Signed)
AVS, Rx's x2, and DNR order form placed in white envelope and given to daughter of pt to take to [facility].

## 2016-07-03 NOTE — Clinical Social Work Placement (Signed)
   CLINICAL SOCIAL WORK PLACEMENT  NOTE  Date:  07/03/2016  Patient Details  Name: Gabriela Hernandez MRN: [id number] Date of Birth: 09-Oct-1926  Clinical Social Work is seeking post-discharge placement for this patient at the Skilled  Nursing Facility level of care (*CSW will initial, date and re-position this form in  chart as items are completed):  Yes   Patient/family provided with [place] Clinical Social Work Department's list of facilities offering this level of care within the geographic area requested by the patient (or if unable, by the patient's family).  Yes   Patient/family informed of their freedom to choose among providers that offer the needed level of care, that participate in Medicare, Medicaid or managed care program needed by the patient, have an available bed and are willing to accept the patient.  Yes   Patient/family informed of [facility]'s ownership interest in [facility] and [facility], as well as of the fact that they are under no obligation to receive care at these facilities.  PASRR submitted to EDS on 07/02/16     PASRR number received on 07/02/16     Existing PASRR number confirmed on       FL2 transmitted to all facilities in geographic area requested by pt/family on 07/02/16     FL2 transmitted to all facilities within larger geographic area on       Patient informed that his/her managed care company has contracts with or will negotiate with certain facilities, including the following:        Yes   Patient/family informed of bed offers received.  Patient chooses bed at [facility]     Physician recommends and patient chooses bed at      Patient to be transferred to [facility] on 07/03/16.  Patient to be transferred to facility by Daughter's car     Patient family notified on 07/03/16 of transfer.  Name of family member notified:  [facility]     PHYSICIAN Please prepare prescriptions     Additional Comment:     _______________________________________________ Margarito Liner, LCSW 07/03/2016, 1:00 PM

## 2016-07-03 NOTE — Progress Notes (Signed)
Attempted to give report to receiving nurse at [facility], by calling [phone].  Unable to give report bec no one answered the phone.

## 2016-07-03 NOTE — Progress Notes (Signed)
Orders received for pt discharge.  Discharge summary printed, to be given to transport at time of discharge, as pt is going to [facility]. IV removed and site remains clean, dry, intact.  Telemetry removed.  Pt in stable condition and awaiting transport via daughter.

## 2016-07-09 ENCOUNTER — Non-Acute Institutional Stay (SKILLED_NURSING_FACILITY): Payer: Medicare Other | Admitting: Internal Medicine

## 2016-07-09 ENCOUNTER — Telehealth: Payer: Self-pay | Admitting: *Deleted

## 2016-07-09 ENCOUNTER — Encounter: Payer: Self-pay | Admitting: Internal Medicine

## 2016-07-09 DIAGNOSIS — E039 Hypothyroidism, unspecified: Secondary | ICD-10-CM | POA: Diagnosis not present

## 2016-07-09 DIAGNOSIS — I482 Chronic atrial fibrillation, unspecified: Secondary | ICD-10-CM

## 2016-07-09 DIAGNOSIS — I5022 Chronic systolic (congestive) heart failure: Secondary | ICD-10-CM

## 2016-07-09 DIAGNOSIS — A4151 Sepsis due to Escherichia coli [E. coli]: Secondary | ICD-10-CM | POA: Diagnosis not present

## 2016-07-09 NOTE — Telephone Encounter (Signed)
Spoke with Archie Patten at [facility] and she states they will follow her INR and dose her coumadin while she is at the facility. Was admitted to hospital 06/27/2016 to 07/03/2016 with Acute Resp Failure with Hypoxia Acute Pulmonary Edema 2nd to Sepsis and discharged to take Ceftin to finish on Feb 24th. Requested that our clinic be notified when pt is discharged so we can continue her care and our coumadin clinic phone number given and Archie Patten states she will put that on her discharge instructions

## 2016-07-09 NOTE — Progress Notes (Signed)
Location:  [facility] Room Number: 509-P Place of Service:    Provider:  Nancy Nordmann, MD  Patient Care Team: L.Lupe Carney, MD as PCP - General  Extended Emergency Contact Information Primary Emergency Contact: Weigelt,Steve Sherilyn Cooter Address: [address]          Jacky Kindle [place] Home Phone: [phone] Mobile Phone: [phone] Relation: Other Secondary Emergency Contact: Mady Gemma [place] Home Phone: [phone] Mobile Phone: [phone] Relation: Daughter  Code Status:  DNR Goals of care: Advanced Directive information Advanced Directives 07/09/2016  Does Patient Have a Medical Advance Directive? Yes  Type of Advance Directive Out of facility DNR (pink MOST or yellow form)  Does patient want to make changes to medical advance directive? No - Patient declined  Would patient like information on creating a medical advance directive? -     Chief Complaint  Patient presents with  . New Admit To SNF    New Admission Visit    HPI:  Pt is a 83 y.o. female seen today for medical management of chronic diseases.    Patient is very pleasant Female with h/o Atrial fibrillation on Chronic coumadin , Dilated Non Ischemic cardiomyopathy,Hypertension and Hypothyroidism. She was not feeling well at home and her daughter took her to ED . She was found to have hypotension and 3/4 cultures were positive for E Coli. She was treated with Antibiotics and she got better. She also was diuresed as she developed Pulmonary edema. Her troponin were high thought to be due to Hypotension induced ischemia.  Patient continue to have weakness and was discharged to facility for Therapy. She was very active before this episode. Lives alone and was able to drive. She used walker around in the house. She wants to eventually go home with help of her daughter and son. She is doing well with therapy and walking  with the walker. Denies and SOB. Does have dry cough. No dysuria Does have constipation. No abdominal pain or nausea.  Past Medical History:  Diagnosis Date  . Atrial fibrillation (HCC)   . Chronic systolic CHF (congestive heart failure) (HCC) 04/05/2012  . Dilated cardiomyopathy (HCC)    with ejection fraction of 30-35%  . Dysphagia   . Esophageal stricture   . History of shingles   . Hypertension   . Hypothyroidism   . Lightheadedness    Past Surgical History:  Procedure Laterality Date  . APPENDECTOMY    . CATARACT EXTRACTION    . CESAREAN SECTION    . FOOT SURGERY    . THYROIDECTOMY    . TONSILLECTOMY    . VEIN LIGATION      No Known Allergies  Allergies as of 07/09/2016   No Known Allergies     Medication List       Accurate as of 07/09/16  9:46 AM. Always use your most recent med list.          carvedilol 25 MG tablet Commonly known as:  COREG TAKE 1/2 TABLET BY MOUTH TWICE DAILY WITH A MEAL   cefUROXime 500 MG tablet Commonly known as:  CEFTIN Take 1 tablet (500 mg total) by mouth 2 (two) times daily with a meal.   furosemide 20 MG tablet Commonly known as:  LASIX Take 2 tablets (40 mg total) by mouth every morning and take 1 tablet (20 mg total) by mouth every evening.   levothyroxine 112 MCG tablet Commonly known  as:  SYNTHROID, LEVOTHROID Take 112 mcg by mouth daily before breakfast.   multivitamin with minerals tablet Take 1 tablet by mouth daily.   quinapril 10 MG tablet Commonly known as:  ACCUPRIL Take 10 mg by mouth daily.   traMADol 50 MG tablet Commonly known as:  ULTRAM Take 1 tablet (50 mg total) by mouth every 6 (six) hours as needed (pain unrelieved by tylenol).   UNABLE TO FIND Med Name: Med pass 90 mL by mouth 2 times daily x 30 days   warfarin 3 MG tablet Commonly known as:  COUMADIN Take 3 mg by mouth daily.       Review of Systems  Review of Systems  Constitutional: Negative for activity change, appetite change,  chills, diaphoresis, fatigue and fever.  HENT: Negative for mouth sores, postnasal drip, rhinorrhea, sinus pain and sore throat.   Respiratory: Negative for apnea,  chest tightness, shortness of breath and wheezing.   Cardiovascular: Negative for chest pain, palpitations and leg swelling.  Gastrointestinal: Negative for abdominal distention, abdominal pain, diarrhea, nausea and vomiting.  Genitourinary: Negative for dysuria and frequency.  Musculoskeletal: Negative for arthralgias, joint swelling and myalgias.  Skin: Negative for rash.  Neurological: Negative for dizziness, syncope, weakness, light-headedness and numbness.  Psychiatric/Behavioral: Negative for behavioral problems, confusion and sleep disturbance.     Immunization History  Administered Date(s) Administered  . PPD Test 07/03/2016   Pertinent  Health Maintenance Due  Topic Date Due  . PNA vac Low Risk Adult (1 of 2 - PCV13) 03/25/1994  . INFLUENZA VACCINE  12/17/2015  . DEXA SCAN  Completed   No flowsheet data found. Functional Status Survey:    Vitals:   07/09/16 0932  BP: 140/82  Pulse: 79  Resp: 18  Temp: 98.7 F (37.1 C)  TempSrc: Oral  SpO2: 97%  Weight: 132 lb 4.8 oz (60 kg)  Height: 5\' 6"  (1.676 m)   Body mass index is 21.35 kg/m. Physical Exam  Constitutional: She is oriented to person, place, and time. She appears well-developed and well-nourished.  HENT:  Head: Normocephalic.  Mouth/Throat: Oropharynx is clear and moist.  Eyes: Pupils are equal, round, and reactive to light.  Neck: Neck supple.  Cardiovascular: Normal rate and normal heart sounds.  An irregularly irregular rhythm present.  Pulmonary/Chest: Effort normal and breath sounds normal. No respiratory distress. She has no wheezes. She has no rales.  Abdominal: Soft. Bowel sounds are normal. She exhibits no distension. There is no tenderness. There is no rebound.  Musculoskeletal: She exhibits edema.  Lymphadenopathy:    She has no  cervical adenopathy.  Neurological: She is alert and oriented to person, place, and time.  Had 4/5 in UE 3/5 in LE  Skin: Skin is warm and dry.  Psychiatric: She has a normal mood and affect. Her behavior is normal. Thought content normal.    Labs reviewed:  Recent Labs  06/27/16 1540  06/28/16 0428  07/01/16 0341 07/02/16 0455 07/03/16 0508  NA  --   < > 129*  < > 133* 136 137  K  --   < > 3.6  < > 4.2 4.2 4.3  CL  --   < > 96*  < > 94* 97* 93*  CO2  --   < > 22  < > 30 32 36*  GLUCOSE  --   < > 99  < > 96 85 91  BUN  --   < > 32*  < > 22* 21*  16  CREATININE  --   < > 1.05*  < > 0.66 0.67 0.66  CALCIUM  --   < > 8.3*  < > 8.9 8.7* 9.0  MG 1.7  --  2.2  --   --   --   --   < > = values in this interval not displayed.  Recent Labs  06/27/16 1540 06/28/16 0428  AST 52* 52*  ALT 34 35  ALKPHOS 50 45  BILITOT 2.9* 1.8*  PROT 5.2* 4.7*  ALBUMIN 3.0* 2.5*    Recent Labs  07/01/16 0341 07/02/16 0455 07/03/16 0508  WBC 8.6 7.7 9.8  HGB 11.1* 11.3* 11.7*  HCT 34.4* 34.7* 37.0  MCV 91.2 92.3 93.4  PLT 105* 114* 139*   Lab Results  Component Value Date   TSH 1.168 06/27/2016   No results found for: HGBA1C No results found for: CHOL, HDL, LDLCALC, LDLDIRECT, TRIG, CHOLHDL  Significant Diagnostic Results in last 30 days:  Ct Abdomen Pelvis Wo Contrast  Result Date: 06/27/2016 CLINICAL DATA:  Hyperbilirubinemia EXAM: CT ABDOMEN AND PELVIS WITHOUT CONTRAST TECHNIQUE: Multidetector CT imaging of the abdomen and pelvis was performed following the standard protocol without IV contrast. COMPARISON:  Ultrasound from earlier in the same day FINDINGS: Lower chest: Mild atelectatic changes are noted in the left lung base. Pectus excavatum is seen. The cardiac shadow is enlarged and somewhat distorted by the pectus deformity. Hepatobiliary: The liver shows a small hypodensity consistent with cysts. This was not well appreciated on the recent ultrasound. The gallbladder is well  distended with cholelithiasis. No significant pericholecystic fluid is seen. Pancreas: Unremarkable. No pancreatic ductal dilatation or surrounding inflammatory changes. Spleen: Normal in size without focal abnormality. Adrenals/Urinary Tract: The adrenal glands are within normal limits. The right kidney demonstrates a prominent extrarenal pelvis although no obstructive changes are seen. The left kidney is partially pelvic in location. No obstructive changes or calculi are seen. Stomach/Bowel: Diverticulosis of the colon is noted without evidence of diverticulitis. The appendix is not well visualized consistent with the surgical history. Vascular/Lymphatic: Aortic atherosclerosis. No enlarged abdominal or pelvic lymph nodes. Reproductive: Uterus and bilateral adnexa are unremarkable. Other: No abdominal wall hernia or abnormality. No abdominopelvic ascites. Musculoskeletal: Degenerative changes of lumbar spine are seen. IMPRESSION: Cholelithiasis without complicating factors. No biliary dilatation is seen. Hepatic cyst. No acute abnormality noted. Electronically Signed   By: Alcide Clever M.D.   On: 06/27/2016 18:32   Dg Chest 2 View  Result Date: 07/03/2016 CLINICAL DATA:  Shortness of breath, wheezing EXAM: CHEST  2 VIEW COMPARISON:  06/28/2016 FINDINGS: There is no focal parenchymal opacity. There is no pleural effusion or pneumothorax. Severe cardiomegaly. The osseous structures are unremarkable. IMPRESSION: Severe cardiomegaly.  No acute cardiopulmonary disease. Electronically Signed   By: Elige Ko   On: 07/03/2016 11:35   Dg Chest 2 View  Result Date: 06/27/2016 CLINICAL DATA:  Hypotension. EXAM: CHEST  2 VIEW COMPARISON:  None 07/07/2013 FINDINGS: Marked cardiomegaly. No confluent airspace opacities, effusions or edema. No acute bony abnormality. IMPRESSION: Marked cardiomegaly.  No active disease. Electronically Signed   By: Charlett Nose M.D.   On: 06/27/2016 16:17   Ct Head Wo  Contrast  Result Date: 06/27/2016 CLINICAL DATA:  Fall, posterior head injury EXAM: CT HEAD WITHOUT CONTRAST TECHNIQUE: Contiguous axial images were obtained from the base of the skull through the vertex without intravenous contrast. COMPARISON:  03/08/2015 FINDINGS: Brain: No evidence of acute infarction, hemorrhage, hydrocephalus, extra-axial collection or  mass lesion/mass effect. Global cortical atrophy. Subcortical white matter and periventricular small vessel ischemic changes. Vascular: No hyperdense vessel or unexpected calcification. Skull: Normal. Negative for fracture or focal lesion. Sinuses/Orbits: Partial opacification of the left frontal sinus. Visualized paranasal sinuses the mastoid air cells are otherwise clear. Other: None. IMPRESSION: No evidence of acute intracranial abnormality. Atrophy with small vessel ischemic changes. Electronically Signed   By: Charline Bills M.D.   On: 06/27/2016 16:47   Dg Chest Port 1 View  Result Date: 06/28/2016 CLINICAL DATA:  Dyspnea EXAM: PORTABLE CHEST 1 VIEW COMPARISON:  06/27/2016 FINDINGS: Cardiomegaly.  No frank interstitial edema. Left lung base is obscured.  No definite pleural effusion. No pneumothorax. IMPRESSION: Cardiomegaly.  No frank interstitial edema. Electronically Signed   By: Charline Bills M.D.   On: 06/28/2016 17:49   US Abdomen Limited Ruq  Result Date: 06/27/2016 CLINICAL DATA:  Elevated bilirubin EXAM: US ABDOMEN LIMITED - RIGHT UPPER QUADRANT COMPARISON:  None. FINDINGS: Gallbladder: Gallbladder is well distended with multiple stones. Mild gallbladder wall thickening is noted. No pericholecystic fluid is seen. Common bile duct: Diameter: 3 mm. Liver: Mildly heterogeneous although no focal lesion is seen. No definitive biliary obstruction is noted. IMPRESSION: Cholelithiasis with mild wall thickening. No other focal abnormality is seen. Electronically Signed   By: Alcide Clever M.D.   On: 06/27/2016 18:19     Assessment/Plan  Sepsis due to Escherichia coli  Patient is on Cefuroxime till 02/24. She is afebrile and doing well.  Chronic atrial fibrillation (HCC) Rate controlled. On coumadin Last INR was 2.4  Chronic systolic CHF (congestive heart failure) (HCC) Patient Baseline weight is 125 lbs and she is 130 lbs here. Continue same dose of Lasix. She is stable does not look Volume overload. Repeat BMP. Continue Coreg  Hypertension Stable on Accupril Hypothyroid TSH normal in hospital Continue same dose of Synthroid. Constipation Will start on Senna PRN Hyperbilirubinemia Repeat LFT.  She does have Cholelithiasis but asymptomatic. Family/ staff Communication:   Labs/tests ordered:  CBC, BMP, LFT, INR

## 2016-07-13 LAB — CBC AND DIFFERENTIAL
HEMATOCRIT: 37 % (ref 36–46)
HEMOGLOBIN: 11.9 g/dL — AB (ref 12.0–16.0)
PLATELETS: 226 10*3/uL (ref 150–399)
WBC: 5.6 10^3/mL

## 2016-07-13 LAB — BASIC METABOLIC PANEL
BUN: 10 mg/dL (ref 4–21)
Creatinine: 0.5 mg/dL (ref 0.5–1.1)
GLUCOSE: 88 mg/dL
Potassium: 3.5 mmol/L (ref 3.4–5.3)
Sodium: 142 mmol/L (ref 137–147)

## 2016-07-13 LAB — HEPATIC FUNCTION PANEL
ALT: 15 U/L (ref 7–35)
AST: 23 U/L (ref 13–35)
Alkaline Phosphatase: 93 U/L (ref 25–125)
BILIRUBIN, TOTAL: 1.8 mg/dL

## 2016-07-15 ENCOUNTER — Encounter: Payer: Self-pay | Admitting: Family

## 2016-07-15 ENCOUNTER — Non-Acute Institutional Stay (SKILLED_NURSING_FACILITY): Payer: Medicare Other | Admitting: Family

## 2016-07-15 DIAGNOSIS — E039 Hypothyroidism, unspecified: Secondary | ICD-10-CM | POA: Diagnosis not present

## 2016-07-15 DIAGNOSIS — E440 Moderate protein-calorie malnutrition: Secondary | ICD-10-CM

## 2016-07-15 DIAGNOSIS — I482 Chronic atrial fibrillation, unspecified: Secondary | ICD-10-CM

## 2016-07-15 DIAGNOSIS — I110 Hypertensive heart disease with heart failure: Secondary | ICD-10-CM | POA: Diagnosis not present

## 2016-07-15 DIAGNOSIS — R531 Weakness: Secondary | ICD-10-CM | POA: Diagnosis not present

## 2016-07-15 DIAGNOSIS — R2681 Unsteadiness on feet: Secondary | ICD-10-CM

## 2016-07-15 DIAGNOSIS — I5022 Chronic systolic (congestive) heart failure: Secondary | ICD-10-CM | POA: Diagnosis not present

## 2016-07-15 NOTE — Progress Notes (Addendum)
Location:  [facility] Room Number: 509-P Place of Service:  SNF (31)  Provider:Marietta Sikkema FNP-C   PCP: Lupe Carney, MD Patient Care Team: L.Lupe Carney, MD as PCP - General  Extended Emergency Contact Information Primary Emergency Contact: Dumond,Steve Sherilyn Cooter Address: [address]          Jacky Kindle [place] Home Phone: [phone] Mobile Phone: [phone] Relation: Other Secondary Emergency Contact: Mady Gemma [place] Home Phone: [phone] Mobile Phone: [phone] Relation: Daughter  Code Status: DNR  Goals of care:  Advanced Directive information Advanced Directives 07/09/2016  Does Patient Have a Medical Advance Directive? Yes  Type of Advance Directive Out of facility DNR (pink MOST or yellow form)  Does patient want to make changes to medical advance directive? No - Patient declined  Would patient like information on creating a medical advance directive? -     No Known Allergies  Chief Complaint  Patient presents with  . Discharge Note    Discharge Visit     HPI:  83 y.o. female seen today at  [facility] for discharge to [facility]F [facility]. She was here for short term rehabilitation post hospital admission from 06/27/2016- 07/03/2016 for diarrhea, weakness and fall. She was hypotensive and 3/4 cultures were positive for E.coli.She was treated with antibiotics for sepsis. She was also diuresed due to pulmonary edema. Her troponin were high cardiology was consulted thought to be due to hypotension induced ischemia. 12-lead EKG done on showed atrial fibrillation and repeat 12-lead EKG showed aberrantly conducted complexes. Last 2-D echo done 05/20/16 and showed an EF of 55-60 percent.She also had mildly elevated Bilirubin and CT of the abdomen was done which showed cholelithiasis without complicating factors and no biliary dilatation. She has a medical history of HTN,  Hypothyroidism, Atrial fibrillation on Chronic coumadin , CHF,dilated Non Ischemic cardiomyopathy among other conditions. She is seen in her room today with family at the bedside. She denies any acute issues this visit. Her generalized weakness has improved. She has had unremarkable stay here at rehab.  She has worked well with PT/OT now stable for discharge.She will be discharged  to ALF [facility]  with Home health PT/OT to continue with ROM, Exercise, Gait stability and muscle strengthening. She will also require a HH RN for INR management.She will require DME 3-1 bedside commode for her to safely and independently perform toileting transfer in ALF due to her unsteady gait.Home health services will be arranged by facility social worker prior to discharge. Prescription medication will be written x 1 month then patient to follow up with PCP in 1-2 weeks. Facility staff report no new concerns.     Past Medical History:  Diagnosis Date  . Atrial fibrillation (HCC)   . Chronic systolic CHF (congestive heart failure) (HCC) 04/05/2012  . Dilated cardiomyopathy (HCC)    with ejection fraction of 30-35%  . Dysphagia   . Esophageal stricture   . History of shingles   . Hypertension   . Hypothyroidism   . Lightheadedness     Past Surgical History:  Procedure Laterality Date  . APPENDECTOMY    . CATARACT EXTRACTION    . CESAREAN SECTION    . FOOT SURGERY    . THYROIDECTOMY    . TONSILLECTOMY    . VEIN LIGATION        reports that she has never smoked. She has never used smokeless tobacco. She reports  that she does not drink alcohol or use drugs. Social History   Social History  . Marital status: Married    Spouse name: N/A  . Number of children: 2  . Years of education: N/A   Occupational History  .  Retired   Social History Main Topics  . Smoking status: Never Smoker  . Smokeless tobacco: Never Used  . Alcohol use No  . Drug use: No  . Sexual activity: Not on file   Other  Topics Concern  . Not on file   Social History Narrative  . No narrative on file    No Known Allergies  Pertinent  Health Maintenance Due  Topic Date Due  . PNA vac Low Risk Adult (1 of 2 - PCV13) 03/25/1994  . INFLUENZA VACCINE  12/17/2015  . DEXA SCAN  Completed    Medications: Allergies as of 07/15/2016   No Known Allergies     Medication List       Accurate as of 07/15/16  5:06 PM. Always use your most recent med list.          carvedilol 25 MG tablet Commonly known as:  COREG TAKE 1/2 TABLET BY MOUTH TWICE DAILY WITH A MEAL   docusate sodium 100 MG capsule Commonly known as:  COLACE Take 200 mg by mouth daily.   furosemide 20 MG tablet Commonly known as:  LASIX Take 2 tablets (40 mg total) by mouth every morning and take 1 tablet (20 mg total) by mouth every evening.   levothyroxine 112 MCG tablet Commonly known as:  SYNTHROID, LEVOTHROID Take 112 mcg by mouth daily before breakfast.   multivitamin with minerals tablet Take 1 tablet by mouth daily.   quinapril 10 MG tablet Commonly known as:  ACCUPRIL Take 10 mg by mouth daily.   senna 8.6 MG tablet Commonly known as:  SENOKOT Take 1-2 tablets by mouth daily as needed for constipation.   traMADol 50 MG tablet Commonly known as:  ULTRAM Take 1 tablet (50 mg total) by mouth every 6 (six) hours as needed (pain unrelieved by tylenol).   UNABLE TO FIND Med Name: Med pass 90 mL by mouth 2 times daily x 30 days   warfarin 3 MG tablet Commonly known as:  COUMADIN Take 3.5 mg by mouth daily.       Review of Systems  Constitutional: Negative for activity change, appetite change, chills, fatigue and fever.  HENT: Negative for congestion, rhinorrhea, sinus pain, sinus pressure, sneezing and sore throat.   Eyes: Negative for pain, discharge, redness and itching.       Seen by opthalmology prior to visit   Respiratory: Negative for cough, chest tightness, shortness of breath and wheezing.     Cardiovascular: Positive for leg swelling. Negative for chest pain and palpitations.  Gastrointestinal: Negative for abdominal distention, abdominal pain, constipation, diarrhea, nausea and vomiting.  Endocrine: Negative.   Genitourinary: Negative for dysuria, flank pain, frequency and urgency.  Musculoskeletal: Positive for gait problem.  Skin: Negative for color change, pallor, rash and wound.  Neurological: Negative for dizziness, seizures, syncope, light-headedness and headaches.       Generalized weakness   Hematological: Does not bruise/bleed easily.  Psychiatric/Behavioral: Negative for agitation, confusion, hallucinations and sleep disturbance. The patient is not nervous/anxious.     Vitals:   07/15/16 1230  BP: 140/80  Pulse: 76  Resp: 20  Temp: 98 F (36.7 C)  TempSrc: Oral  SpO2: 96%  Weight: 131 lb (59.4 kg)  Height: 5\' 6"  (1.676 m)   Body mass index is 21.14 kg/m. Physical Exam  Constitutional: She is oriented to person, place, and time.  Tall thin elderly in no acute distress  HENT:  Head: Normocephalic.  Mouth/Throat: Oropharynx is clear and moist. No oropharyngeal exudate.  Eyes: Conjunctivae and EOM are normal. Pupils are equal, round, and reactive to light. Right eye exhibits no discharge. Left eye exhibits no discharge. No scleral icterus.  Neck: Normal range of motion. No JVD present. No thyromegaly present.  Cardiovascular: Normal rate, regular rhythm, normal heart sounds and intact distal pulses.  Exam reveals no gallop and no friction rub.   No murmur heard. Pulmonary/Chest: Effort normal and breath sounds normal. No respiratory distress. She has no wheezes. She has no rales.  Abdominal: Soft. Bowel sounds are normal. She exhibits no distension. There is no rebound and no guarding.  Genitourinary:  Genitourinary Comments: continent  Musculoskeletal: She exhibits no tenderness or deformity.  Unsteady gait. Bilateral lower extremities knee high  compression stockings in place trace -1+ edema present.   Lymphadenopathy:    She has no cervical adenopathy.  Neurological: She is oriented to person, place, and time.  Skin: Skin is warm and dry. No rash noted. No erythema. No pallor.  Psychiatric: She has a normal mood and affect.    Labs reviewed: Basic Metabolic Panel:  Recent Labs  16/10/96 1540  06/28/16 0428  07/01/16 0341 07/02/16 0455 07/03/16 0508 07/13/16  NA  --   < > 129*  < > 133* 136 137 142  K  --   < > 3.6  < > 4.2 4.2 4.3 3.5  CL  --   < > 96*  < > 94* 97* 93*  --   CO2  --   < > 22  < > 30 32 36*  --   GLUCOSE  --   < > 99  < > 96 85 91  --   BUN  --   < > 32*  < > 22* 21* 16 10  CREATININE  --   < > 1.05*  < > 0.66 0.67 0.66 0.5  CALCIUM  --   < > 8.3*  < > 8.9 8.7* 9.0  --   MG 1.7  --  2.2  --   --   --   --   --   < > = values in this interval not displayed. Liver Function Tests:  Recent Labs  06/27/16 1540 06/28/16 0428 07/13/16  AST 52* 52* 23  ALT 34 35 15  ALKPHOS 50 45 93  BILITOT 2.9* 1.8*  --   PROT 5.2* 4.7*  --   ALBUMIN 3.0* 2.5*  --    CBC:  Recent Labs  07/01/16 0341 07/02/16 0455 07/03/16 0508 07/13/16  WBC 8.6 7.7 9.8 5.6  HGB 11.1* 11.3* 11.7* 11.9*  HCT 34.4* 34.7* 37.0 37  MCV 91.2 92.3 93.4  --   PLT 105* 114* 139* 226   Cardiac Enzymes:  Recent Labs  06/27/16 1540  06/27/16 2252 06/28/16 0428 06/30/16 1102 07/01/16 0341  CKTOTAL 506*  --   --  654*  --  82  TROPONINI  --   < > 0.03* 0.14* 0.16*  --   < > = values in this interval not displayed.   Recent Labs  06/27/16 1521 06/29/16 1116  GLUCAP 121* 221*    Assessment/Plan:   1. Unsteady gait Has worked well with PT/ OT. Will discharge  to ALF [facility] with home health PT/OT to continue with ROM, Exercise, Gait stability and muscle strengthening. She will require DME 3-1 bedside commode for her to safely and independently perform toileting transfer in ALF. Fall and safety precautions.   2.  Hypertensive heart disease with CHF  B/p stable. Continue Accupril, coreg and furosemide. BMP in 1-2 weeks with PCP.   3. Chronic systolic CHF (congestive heart failure) Stable. Exam findings negative for cough, shortness of breath , wheezing or rales. She has trace -1+ edema to lower extremities.Knee high Ted hose in place.Continue  Accupril, coreg and furosemide.Fluid Restriction 1.5 Liters per day. Daily weight.   4. Chronic atrial fibrillation Rate controlled. Continue on coumadin and adjust dose per INR.Currently INR is 1.4 (07/16/2016) coumadin dose increased to 4 mg Tablet daily.HH RN for INR management. Next INR due 07/20/2016.  5. Hypothyroidism, unspecified type Continue Synthroid. Monitor TSH level.   6. Generalized weakness Has improved with therapy. Continue PT/OT for muscle strengthening.   7. Protein malnutrition  Seen by facility Registered Dietician Med pass 2.o ordered.Continue on med pass 2.0 90 mls by mouth twice daily x total of 30 days until 08/11/2016.   Patient is being discharged with the following home health services:   - PT/OT for ROM, exercise, gait stability and muscle strengthening.   Patient is being discharged with the following durable medical equipment:   3-1 commode    Patient has been advised to f/u with their PCP in 1-2 weeks to for a transitions of care visit.  Social services at their facility was responsible for arranging this appointment.  Pt was provided with adequate prescriptions of noncontrolled medications to reach the scheduled appointment .  For controlled substances, a limited supply was provided as appropriate for the individual patient.  If the pt normally receives these medications from a pain clinic or has a contract with another physician, these medications should be received from that clinic or physician only).    Future labs/tests needed: CBC, BMP in 1-2 week with PCP.Next INR due 07/20/2016

## 2016-07-24 ENCOUNTER — Ambulatory Visit (INDEPENDENT_AMBULATORY_CARE_PROVIDER_SITE_OTHER): Payer: Medicare Other | Admitting: Pharmacist

## 2016-07-24 DIAGNOSIS — I482 Chronic atrial fibrillation, unspecified: Secondary | ICD-10-CM

## 2016-07-24 DIAGNOSIS — Z5181 Encounter for therapeutic drug level monitoring: Secondary | ICD-10-CM

## 2016-07-24 LAB — POCT INR: INR: 1.7

## 2016-07-31 ENCOUNTER — Ambulatory Visit (INDEPENDENT_AMBULATORY_CARE_PROVIDER_SITE_OTHER): Payer: Medicare Other | Admitting: Pharmacist

## 2016-07-31 DIAGNOSIS — Z5181 Encounter for therapeutic drug level monitoring: Secondary | ICD-10-CM

## 2016-07-31 LAB — POCT INR: INR: 3

## 2016-08-06 ENCOUNTER — Emergency Department (HOSPITAL_COMMUNITY): Payer: Medicare Other

## 2016-08-06 ENCOUNTER — Emergency Department (HOSPITAL_COMMUNITY)
Admission: EM | Admit: 2016-08-06 | Discharge: 2016-08-06 | Disposition: A | Discharge status: Home / Self Care | Payer: Medicare Other | Attending: Emergency Medicine | Admitting: Emergency Medicine

## 2016-08-06 ENCOUNTER — Encounter (HOSPITAL_COMMUNITY): Payer: Self-pay | Admitting: *Deleted

## 2016-08-06 DIAGNOSIS — Y939 Activity, unspecified: Secondary | ICD-10-CM | POA: Diagnosis not present

## 2016-08-06 DIAGNOSIS — Z7901 Long term (current) use of anticoagulants: Secondary | ICD-10-CM | POA: Diagnosis not present

## 2016-08-06 DIAGNOSIS — S0990XA Unspecified injury of head, initial encounter: Secondary | ICD-10-CM | POA: Diagnosis not present

## 2016-08-06 DIAGNOSIS — S0181XA Laceration without foreign body of other part of head, initial encounter: Secondary | ICD-10-CM | POA: Diagnosis not present

## 2016-08-06 DIAGNOSIS — Y929 Unspecified place or not applicable: Secondary | ICD-10-CM | POA: Diagnosis not present

## 2016-08-06 DIAGNOSIS — W19XXXA Unspecified fall, initial encounter: Secondary | ICD-10-CM

## 2016-08-06 DIAGNOSIS — S161XXA Strain of muscle, fascia and tendon at neck level, initial encounter: Secondary | ICD-10-CM | POA: Diagnosis not present

## 2016-08-06 DIAGNOSIS — Y999 Unspecified external cause status: Secondary | ICD-10-CM | POA: Diagnosis not present

## 2016-08-06 DIAGNOSIS — I110 Hypertensive heart disease with heart failure: Secondary | ICD-10-CM | POA: Diagnosis not present

## 2016-08-06 DIAGNOSIS — W01198A Fall on same level from slipping, tripping and stumbling with subsequent striking against other object, initial encounter: Secondary | ICD-10-CM | POA: Insufficient documentation

## 2016-08-06 DIAGNOSIS — E039 Hypothyroidism, unspecified: Secondary | ICD-10-CM | POA: Diagnosis not present

## 2016-08-06 DIAGNOSIS — I5022 Chronic systolic (congestive) heart failure: Secondary | ICD-10-CM | POA: Diagnosis not present

## 2016-08-06 LAB — CBC WITH DIFFERENTIAL/PLATELET
BASOS ABS: 0 10*3/uL (ref 0.0–0.1)
Basophils Relative: 0 %
EOS PCT: 2 %
Eosinophils Absolute: 0.1 10*3/uL (ref 0.0–0.7)
HCT: 36.6 % (ref 36.0–46.0)
Hemoglobin: 11.6 g/dL — ABNORMAL LOW (ref 12.0–15.0)
LYMPHS PCT: 20 %
Lymphs Abs: 1.1 10*3/uL (ref 0.7–4.0)
MCH: 30.3 pg (ref 26.0–34.0)
MCHC: 31.7 g/dL (ref 30.0–36.0)
MCV: 95.6 fL (ref 78.0–100.0)
Monocytes Absolute: 0.5 10*3/uL (ref 0.1–1.0)
Monocytes Relative: 9 %
NEUTROS PCT: 69 %
Neutro Abs: 3.6 10*3/uL (ref 1.7–7.7)
PLATELETS: 125 10*3/uL — AB (ref 150–400)
RBC: 3.83 MIL/uL — AB (ref 3.87–5.11)
RDW: 19 % — ABNORMAL HIGH (ref 11.5–15.5)
WBC: 5.3 10*3/uL (ref 4.0–10.5)

## 2016-08-06 LAB — BASIC METABOLIC PANEL
Anion gap: 7 (ref 5–15)
BUN: 12 mg/dL (ref 6–20)
CO2: 35 mmol/L — ABNORMAL HIGH (ref 22–32)
Calcium: 9.1 mg/dL (ref 8.9–10.3)
Chloride: 99 mmol/L — ABNORMAL LOW (ref 101–111)
Creatinine, Ser: 0.68 mg/dL (ref 0.44–1.00)
GFR calc Af Amer: >60 mL/min (ref >60)
Glucose, Bld: 104 mg/dL — ABNORMAL HIGH (ref 65–99)
Potassium: 3.8 mmol/L (ref 3.5–5.1)
SODIUM: 141 mmol/L (ref 135–145)

## 2016-08-06 LAB — PROTIME-INR
INR: 2.76
Prothrombin Time: 29.8 seconds — ABNORMAL HIGH (ref 11.4–15.2)

## 2016-08-06 MED ORDER — LIDOCAINE HCL (PF) 1 % IJ SOLN
5.0000 mL | Freq: Once | INTRAMUSCULAR | Status: AC
  Administered 2016-08-06: 5 mL via INTRADERMAL
  Filled 2016-08-06: qty 5

## 2016-08-06 NOTE — ED Notes (Signed)
MD at pt bedside

## 2016-08-06 NOTE — ED Notes (Addendum)
Pt is in stable condition upon d/c and is escorted from ED via wheelchair. DNR left at bedside. Called pt phone number listed in chart to tell [facility].

## 2016-08-06 NOTE — ED Triage Notes (Signed)
Pt arrives from [facility] after having a witness fall by tripping over her walker and falling face first into the carpet. Pt is on coumadin and most recent INR was 3.4 according to SNF. Pt has a lac to the left temple, edema to the left face and an abrasion to the posterior head.

## 2016-08-06 NOTE — ED Notes (Signed)
Pt's family member asking to see MD. Informed Dr. Judd Lien.

## 2016-08-06 NOTE — ED Provider Notes (Signed)
[facility] DEPT Provider Note   CSN: [id number] Arrival date & time: 08/06/16  1012     History   Chief Complaint Chief Complaint  Patient presents with  . Fall    HPI Gabriela Hernandez is a 83 y.o. female.  Patient is an 83 year old female with past medical history of atrial fibrillation, dilated cardiomyopathy, CHF on Coumadin. She presents after a fall. She tripped over her walker, fell forward, and hit the left side of her forehead on the ground. She denies any loss of consciousness. She does have significant swelling around the left eye and a laceration to the left temple. She is complaining of mild neck discomfort.   The history is provided by the patient.  Fall  This is a new problem. The current episode started less than 1 hour ago. The problem occurs constantly. The problem has not changed since onset.Associated symptoms include headaches. Pertinent negatives include no chest pain and no shortness of breath. Nothing aggravates the symptoms. Nothing relieves the symptoms.    Past Medical History:  Diagnosis Date  . Atrial fibrillation (HCC)   . Chronic systolic CHF (congestive heart failure) (HCC) 04/05/2012  . Dilated cardiomyopathy (HCC)    with ejection fraction of 30-35%  . Dysphagia   . Esophageal stricture   . History of shingles   . Hypertension   . Hypothyroidism   . Lightheadedness     Patient Active Problem List   Diagnosis Date Noted  . Sepsis (HCC) secondary to Escherichia coli bacteremia 06/28/2016  . AKI (acute kidney injury) (HCC) 06/28/2016  . Demand ischemia (HCC) 06/28/2016  . Elevated troponin 06/28/2016  . Elevated CK 06/28/2016  . Rhabdomyolysis 06/28/2016  . Septic shock (HCC)   . Acute pulmonary edema (HCC)   . Dehydration 06/27/2016  . Diarrhea 06/27/2016  . Acute hyponatremia 06/27/2016  . Fall 06/27/2016  . Hypothyroidism 06/27/2016  . Hyperbilirubinemia 06/27/2016  . Encounter for therapeutic drug monitoring 06/14/2013  .  Dyspnea 02/16/2013  . Fatigue 10/03/2012  . Chronic systolic CHF (congestive heart failure) (HCC) 04/05/2012  . Dilated cardiomyopathy (HCC)   . Hypertensive heart disease with CHF (HCC)   . Atrial fibrillation (HCC) 08/19/2010    Past Surgical History:  Procedure Laterality Date  . APPENDECTOMY    . CATARACT EXTRACTION    . CESAREAN SECTION    . FOOT SURGERY    . THYROIDECTOMY    . TONSILLECTOMY    . VEIN LIGATION      OB History    No data available       Home Medications    Prior to Admission medications   Medication Sig Start Date End Date Taking? Authorizing Provider  carvedilol (COREG) 25 MG tablet TAKE 1/2 TABLET BY MOUTH TWICE DAILY WITH A MEAL 06/19/16   Peter M [place], MD  docusate sodium (COLACE) 100 MG capsule Take 200 mg by mouth daily.    Historical Provider, MD  furosemide (LASIX) 20 MG tablet Take 2 tablets (40 mg total) by mouth every morning and take 1 tablet (20 mg total) by mouth every evening. 06/08/16   Peter M [place], MD  levothyroxine (SYNTHROID, LEVOTHROID) 112 MCG tablet Take 112 mcg by mouth daily before breakfast.    Historical Provider, MD  Multiple Vitamins-Minerals (MULTIVITAMIN WITH MINERALS) tablet Take 1 tablet by mouth daily.    Historical Provider, MD  quinapril (ACCUPRIL) 10 MG tablet Take 10 mg by mouth daily.    Historical Provider, MD  senna (SENOKOT) 8.6 MG  tablet Take 1-2 tablets by mouth daily as needed for constipation.    Historical Provider, MD  traMADol (ULTRAM) 50 MG tablet Take 1 tablet (50 mg total) by mouth every 6 (six) hours as needed (pain unrelieved by tylenol). 07/03/16   Dorothea Ogle, MD  UNABLE TO FIND Med Name: Med pass 90 mL by mouth 2 times daily x 30 days    Historical Provider, MD  warfarin (COUMADIN) 3 MG tablet Take 3.5 mg by mouth daily.     Historical Provider, MD    Family History Family History  Problem Relation Age of Onset  . Hypertension Mother   . Heart attack Father     Social History Social  History  Substance Use Topics  . Smoking status: Never Smoker  . Smokeless tobacco: Never Used  . Alcohol use No     Allergies   Patient has no known allergies.   Review of Systems Review of Systems  Respiratory: Negative for shortness of breath.   Cardiovascular: Negative for chest pain.  Neurological: Positive for headaches.  All other systems reviewed and are negative.    Physical Exam Updated Vital Signs Temp 97.8 F (36.6 C) (Oral)   SpO2 95%   Physical Exam  Constitutional: She is oriented to person, place, and time. She appears well-developed and well-nourished. No distress.  HENT:  Head: Normocephalic.  There is a 2.5 cm laceration to the left temple.  Eyes: EOM are normal. Pupils are equal, round, and reactive to light.  There is significant periorbital swelling of the left eye. Examination of the eye itself is difficult secondary to degree of swelling, however the globe appears intact. She is able to see out of the left eye when pried open and the pupil is reactive.  Neck: Normal range of motion. Neck supple.  Cardiovascular: Normal rate and regular rhythm.  Exam reveals no gallop and no friction rub.   No murmur heard. Pulmonary/Chest: Effort normal and breath sounds normal. No respiratory distress. She has no wheezes.  Abdominal: Soft. Bowel sounds are normal. She exhibits no distension. There is no tenderness.  Musculoskeletal: Normal range of motion.  Neurological: She is alert and oriented to person, place, and time.  Skin: Skin is warm and dry. She is not diaphoretic.  Nursing note and vitals reviewed.    ED Treatments / Results  Labs (all labs ordered are listed, but only abnormal results are displayed) Labs Reviewed - No data to display  EKG  EKG Interpretation None       Radiology No results found.  Procedures Procedures (including critical care time)  Medications Ordered in ED Medications  lidocaine (PF) (XYLOCAINE) 1 % injection 5  mL (not administered)     Initial Impression / Assessment and Plan / ED Course  I have reviewed the triage vital signs and the nursing notes.  Pertinent labs & imaging results that were available during my care of the patient were reviewed by me and considered in my medical decision making (see chart for details).  Patient on Coumadin. Presents after a fall area did she has significant swelling to the left periorbital tissues, however no evidence for globe injury. Her pupil is reactive and she can see with the eye. Her neurologic exam is otherwise nonfocal. She has a laceration to the left temple which was repaired as below.  CT scan shows no evidence for intracranial bleeding, cervical spine fracture, or maxillofacial fracture. She will be discharged with local wound care and suture  removal in 5-7 days.   LACERATION REPAIR Performed by: Geoffery Lyons Authorized by: Geoffery Lyons Consent: Verbal consent obtained. Risks and benefits: risks, benefits and alternatives were discussed Consent given by: patient Patient identity confirmed: provided demographic data Prepped and Draped in normal sterile fashion Wound explored  Laceration Location: Left temple  Laceration Length: 0.5 cm  No Foreign Bodies seen or palpated  Anesthesia: local infiltration  Local anesthetic: lidocaine 1 % without epinephrine  Anesthetic total: 3 ml  Irrigation method: syringe Amount of cleaning: standard  Skin closure: 5-0 Prolene   Number of sutures: 3   Technique: Simple interrupted   Patient tolerance: Patient tolerated the procedure well with no immediate complications.   Final Clinical Impressions(s) / ED Diagnoses   Final diagnoses:  None    New Prescriptions New Prescriptions   No medications on file     Geoffery Lyons, MD 08/06/16 1225

## 2016-08-06 NOTE — Discharge Instructions (Signed)
Local wound care with bacitracin and dressing changes twice daily.  Sutures are to be removed in 5-7 days. Please follow-up with your primary Dr. for this.  Return to the emergency department for worsening headache, confusion, changes in level of consciousness, or other new and concerning symptoms.  Apply ice to the swollen areas 20 minutes every 2 hours while awake for the next 2 days to help with swelling.

## 2016-08-07 ENCOUNTER — Telehealth: Payer: Self-pay

## 2016-08-07 ENCOUNTER — Ambulatory Visit (INDEPENDENT_AMBULATORY_CARE_PROVIDER_SITE_OTHER): Payer: Medicare Other

## 2016-08-07 DIAGNOSIS — Z5181 Encounter for therapeutic drug level monitoring: Secondary | ICD-10-CM

## 2016-08-07 DIAGNOSIS — I4891 Unspecified atrial fibrillation: Secondary | ICD-10-CM

## 2016-08-07 LAB — POCT INR: INR: 2.8

## 2016-08-07 NOTE — Telephone Encounter (Signed)
Received request from [facility].Dr.Jordan advised ok to hold Coumadin 5 days prior to flex sigmoidoscopy.Request faxed back to fax # [phone].

## 2016-08-11 ENCOUNTER — Telehealth: Payer: Self-pay | Admitting: Cardiology

## 2016-08-11 NOTE — Telephone Encounter (Signed)
Patient son calling and states that patient is now in an assisted living facility and states that they will do coumadin check on Friday 08-14-16. Patient son states that the facility will call with the results and requested that we give him a call to schedule her next coumadin appt.  Patient would also like to have coumadin checked at [facility]. Thanks.

## 2016-08-11 NOTE — Telephone Encounter (Signed)
[facility]; Mr Daughty to call back at [phone] to schedule f/u appointment for patient.

## 2016-08-12 NOTE — Telephone Encounter (Signed)
Last INR at home with PT scheduled for 08/14/16.   **Appointment for clinic follow up 4/18 @ 9:15 at [facility] office coumadin clinic per patient request**

## 2016-08-14 ENCOUNTER — Encounter (HOSPITAL_COMMUNITY): Payer: Self-pay | Admitting: Emergency Medicine

## 2016-08-14 ENCOUNTER — Emergency Department (HOSPITAL_COMMUNITY)
Admission: EM | Admit: 2016-08-14 | Discharge: 2016-08-14 | Disposition: A | Discharge status: Home / Self Care | Payer: Medicare Other | Attending: Emergency Medicine | Admitting: Emergency Medicine

## 2016-08-14 ENCOUNTER — Emergency Department (HOSPITAL_COMMUNITY): Payer: Medicare Other

## 2016-08-14 DIAGNOSIS — E860 Dehydration: Secondary | ICD-10-CM

## 2016-08-14 DIAGNOSIS — Z7901 Long term (current) use of anticoagulants: Secondary | ICD-10-CM | POA: Insufficient documentation

## 2016-08-14 DIAGNOSIS — I110 Hypertensive heart disease with heart failure: Secondary | ICD-10-CM | POA: Diagnosis not present

## 2016-08-14 DIAGNOSIS — R531 Weakness: Secondary | ICD-10-CM | POA: Insufficient documentation

## 2016-08-14 DIAGNOSIS — Z79899 Other long term (current) drug therapy: Secondary | ICD-10-CM | POA: Insufficient documentation

## 2016-08-14 DIAGNOSIS — I5022 Chronic systolic (congestive) heart failure: Secondary | ICD-10-CM | POA: Insufficient documentation

## 2016-08-14 DIAGNOSIS — R930 Abnormal findings on diagnostic imaging of skull and head, not elsewhere classified: Secondary | ICD-10-CM | POA: Insufficient documentation

## 2016-08-14 DIAGNOSIS — E039 Hypothyroidism, unspecified: Secondary | ICD-10-CM | POA: Insufficient documentation

## 2016-08-14 LAB — URINALYSIS, ROUTINE W REFLEX MICROSCOPIC
Bilirubin Urine: NEGATIVE
Glucose, UA: NEGATIVE mg/dL
Hgb urine dipstick: NEGATIVE
Ketones, ur: NEGATIVE mg/dL
Leukocytes, UA: NEGATIVE
Nitrite: NEGATIVE
Protein, ur: NEGATIVE mg/dL
Specific Gravity, Urine: 1.006 (ref 1.005–1.030)
pH: 7 (ref 5.0–8.0)

## 2016-08-14 LAB — CBC WITH DIFFERENTIAL/PLATELET
Basophils Absolute: 0 10*3/uL (ref 0.0–0.1)
Basophils Relative: 0 %
Eosinophils Absolute: 0.1 10*3/uL (ref 0.0–0.7)
Eosinophils Relative: 1 %
HCT: 35.8 % — ABNORMAL LOW (ref 36.0–46.0)
Hemoglobin: 11.4 g/dL — ABNORMAL LOW (ref 12.0–15.0)
Lymphocytes Relative: 23 %
Lymphs Abs: 1.4 10*3/uL (ref 0.7–4.0)
MCH: 30.3 pg (ref 26.0–34.0)
MCHC: 31.8 g/dL (ref 30.0–36.0)
MCV: 95.2 fL (ref 78.0–100.0)
Monocytes Absolute: 0.6 10*3/uL (ref 0.1–1.0)
Monocytes Relative: 9 %
Neutro Abs: 4 10*3/uL (ref 1.7–7.7)
Neutrophils Relative %: 67 %
Platelets: 146 10*3/uL — ABNORMAL LOW (ref 150–400)
RBC: 3.76 MIL/uL — ABNORMAL LOW (ref 3.87–5.11)
RDW: 18.8 % — ABNORMAL HIGH (ref 11.5–15.5)
WBC: 6 10*3/uL (ref 4.0–10.5)

## 2016-08-14 LAB — BASIC METABOLIC PANEL
Anion gap: 8 (ref 5–15)
BUN: 15 mg/dL (ref 6–20)
CO2: 30 mmol/L (ref 22–32)
Calcium: 9.1 mg/dL (ref 8.9–10.3)
Chloride: 101 mmol/L (ref 101–111)
Creatinine, Ser: 0.61 mg/dL (ref 0.44–1.00)
GFR calc Af Amer: >60 mL/min (ref >60)
GFR calc non Af Amer: >60 mL/min (ref >60)
Glucose, Bld: 76 mg/dL (ref 65–99)
Potassium: 3.9 mmol/L (ref 3.5–5.1)
Sodium: 139 mmol/L (ref 135–145)

## 2016-08-14 LAB — I-STAT TROPONIN, ED: Troponin i, poc: 0.02 ng/mL (ref 0.00–0.08)

## 2016-08-14 LAB — PROTIME-INR
INR: 2.6
Prothrombin Time: 28.3 seconds — ABNORMAL HIGH (ref 11.4–15.2)

## 2016-08-14 LAB — CBG MONITORING, ED: GLUCOSE-CAPILLARY: 73 mg/dL (ref 65–99)

## 2016-08-14 MED ORDER — SODIUM CHLORIDE 0.9 % IV BOLUS (SEPSIS)
1000.0000 mL | Freq: Once | INTRAVENOUS | Status: AC
  Administered 2016-08-14: 1000 mL via INTRAVENOUS

## 2016-08-14 NOTE — ED Provider Notes (Signed)
She complained of generalized weakness for the past several days. Weakness is worse with standing up. She reports that she does not drink much fluid. She did eat breakfast this morning. No blood per rectum. No black bowel movements. She denies pain anywhere. She was seen on 08/06/2016 for a fall On exam chronically ill-appearing HEENT exam purplish ecchymosis at left cheek and left forehead. Neck supple lungs clear auscultation heart irregularly irregular abdomen nondistended nontender. Neurologic moves all extremities well cranial nerves II through XII grossly intact Glasgow Coma Score 15   Doug Sou, MD 08/14/16 1204

## 2016-08-14 NOTE — Discharge Instructions (Signed)
Return here as needed.  Follow-up with your primary care doctor °

## 2016-08-14 NOTE — ED Provider Notes (Signed)
[facility] Provider Note   CSN: [id number] Arrival date & time: 08/14/16  1104     History   Chief Complaint Chief Complaint  Patient presents with  . Weakness    HPI Gabriela Hernandez is a 83 y.o. female.  HPI Patient presents to the emergency department with generalized weakness.  Patient states today she was walking with her walker, when she felt like she got weak.  She was assisted by workers at the facility where she lives with walking.  Patient states that she did not feel like she may pass out.  She is felt like she was getting weak in her legs.  Patient states this is been getting worse for quite a while.  Patient was here last week for a fall.  Patient states that nothing seems make the condition better or worse. The patient denies chest pain, shortness of breath, headache,blurred vision, neck pain, fever, cough,  numbness, dizziness, anorexia, edema, abdominal pain, nausea, vomiting, diarrhea, rash, back pain, dysuria, hematemesis, bloody stool, near syncope, or syncope. Past Medical History:  Diagnosis Date  . Atrial fibrillation (HCC)   . Chronic systolic CHF (congestive heart failure) (HCC) 04/05/2012  . Dilated cardiomyopathy (HCC)    with ejection fraction of 30-35%  . Dysphagia   . Esophageal stricture   . History of shingles   . Hypertension   . Hypothyroidism   . Lightheadedness     Patient Active Problem List   Diagnosis Date Noted  . Sepsis (HCC) secondary to Escherichia coli bacteremia 06/28/2016  . AKI (acute kidney injury) (HCC) 06/28/2016  . Demand ischemia (HCC) 06/28/2016  . Elevated troponin 06/28/2016  . Elevated CK 06/28/2016  . Rhabdomyolysis 06/28/2016  . Septic shock (HCC)   . Acute pulmonary edema (HCC)   . Dehydration 06/27/2016  . Diarrhea 06/27/2016  . Acute hyponatremia 06/27/2016  . Fall 06/27/2016  . Hypothyroidism 06/27/2016  . Hyperbilirubinemia 06/27/2016  . Encounter for therapeutic drug monitoring 06/14/2013  .  Dyspnea 02/16/2013  . Fatigue 10/03/2012  . Chronic systolic CHF (congestive heart failure) (HCC) 04/05/2012  . Dilated cardiomyopathy (HCC)   . Hypertensive heart disease with CHF (HCC)   . Atrial fibrillation (HCC) 08/19/2010    Past Surgical History:  Procedure Laterality Date  . APPENDECTOMY    . CATARACT EXTRACTION    . CESAREAN SECTION    . FOOT SURGERY    . THYROIDECTOMY    . TONSILLECTOMY    . VEIN LIGATION      OB History    No data available       Home Medications    Prior to Admission medications   Medication Sig Start Date End Date Taking? Authorizing Provider  bacitracin 500 UNIT/GM ointment Apply 1 application topically 2 (two) times daily.   Yes Historical Provider, MD  carvedilol (COREG) 25 MG tablet TAKE 1/2 TABLET BY MOUTH TWICE DAILY WITH A MEAL 06/19/16  Yes Peter M [place], MD  docusate sodium (COLACE) 100 MG capsule Take 200 mg by mouth daily.   Yes Historical Provider, MD  ENSURE (ENSURE) Take 237 mLs by mouth 2 (two) times daily between meals. STRAWBERRY   Yes Historical Provider, MD  furosemide (LASIX) 20 MG tablet Take 2 tablets (40 mg total) by mouth every morning and take 1 tablet (20 mg total) by mouth every evening. Patient taking differently: Take 40 mg by mouth daily. Take 2 tablets (40 mg total) by mouth every morning and take 1 tablet (20 mg total) by  mouth every evening.  06/08/16  Yes Peter M [place], MD  gentamicin (GARAMYCIN) 0.3 % ophthalmic solution Place 1 drop into the left eye every 4 (four) hours.   Yes Historical Provider, MD  levothyroxine (SYNTHROID, LEVOTHROID) 112 MCG tablet Take 112 mcg by mouth daily before breakfast.   Yes Historical Provider, MD  Multiple Vitamins-Minerals (MULTIVITAMIN WITH MINERALS) tablet Take 1 tablet by mouth daily.   Yes Historical Provider, MD  potassium chloride (K-DUR,KLOR-CON) 10 MEQ tablet Take 10 mEq by mouth daily.   Yes Historical Provider, MD  quinapril (ACCUPRIL) 10 MG tablet Take 10 mg by mouth  daily.   Yes Historical Provider, MD  senna (SENOKOT) 8.6 MG tablet Take 1-2 tablets by mouth daily as needed for constipation.   Yes Historical Provider, MD  traMADol (ULTRAM) 50 MG tablet Take 1 tablet (50 mg total) by mouth every 6 (six) hours as needed (pain unrelieved by tylenol). 07/03/16  Yes Dorothea Ogle, MD  warfarin (COUMADIN) 5 MG tablet Take 5 mg by mouth daily.    Yes Historical Provider, MD    Family History Family History  Problem Relation Age of Onset  . Hypertension Mother   . Heart attack Father     Social History Social History  Substance Use Topics  . Smoking status: Never Smoker  . Smokeless tobacco: Never Used  . Alcohol use No     Allergies   Patient has no known allergies.   Review of Systems Review of Systems All other systems negative except as documented in the HPI. All pertinent positives and negatives as reviewed in the HPI.   Physical Exam Updated Vital Signs BP (!) 162/92   Pulse 69   Temp 97.6 F (36.4 C) (Oral)   Resp 13   SpO2 97%   Physical Exam  Constitutional: She is oriented to person, place, and time. She appears well-developed and well-nourished. No distress.  HENT:  Head: Normocephalic and atraumatic.    Mouth/Throat: Oropharynx is clear and moist.  Eyes: Pupils are equal, round, and reactive to light.  Neck: Normal range of motion. Neck supple.  Cardiovascular: Normal rate, regular rhythm and normal heart sounds.  Exam reveals no gallop and no friction rub.   No murmur heard. Pulmonary/Chest: Effort normal and breath sounds normal. No respiratory distress. She has no wheezes.  Abdominal: Soft. Bowel sounds are normal. She exhibits no distension. There is no tenderness.  Neurological: She is alert and oriented to person, place, and time. She exhibits normal muscle tone. Coordination normal.  Skin: Skin is warm and dry. Capillary refill takes less than 2 seconds. No rash noted. No erythema.  Psychiatric: She has a normal  mood and affect. Her behavior is normal.  Nursing note and vitals reviewed.    ED Treatments / Results  Labs (all labs ordered are listed, but only abnormal results are displayed) Labs Reviewed  CBC WITH DIFFERENTIAL/PLATELET - Abnormal; Notable for the following:       Result Value   RBC 3.76 (*)    Hemoglobin 11.4 (*)    HCT 35.8 (*)    RDW 18.8 (*)    Platelets 146 (*)    All other components within normal limits  URINALYSIS, ROUTINE W REFLEX MICROSCOPIC - Abnormal; Notable for the following:    Color, Urine STRAW (*)    All other components within normal limits  PROTIME-INR - Abnormal; Notable for the following:    Prothrombin Time 28.3 (*)    All other components  within normal limits  BASIC METABOLIC PANEL  CBG MONITORING, ED  I-STAT TROPOININ, ED    EKG  EKG Interpretation  Date/Time:  Friday August 14 2016 11:09:08 EDT Ventricular Rate:  66 PR Interval:    QRS Duration: 104 QT Interval:  454 QTC Calculation: 476 R Axis:   -101 Text Interpretation:  Atrial fibrillation Incomplete RBBB and LAFB Anterolateral infarct, age indeterminate No significant change since last tracing Confirmed by Ethelda Chick  MD, SAM [phone]) on 08/14/2016 11:25:59 AM       Radiology Ct Head Wo Contrast  Result Date: 08/14/2016 CLINICAL DATA:  Multiple recent falls. On Coumadin. Unsure of loss of consciousness. EXAM: CT HEAD WITHOUT CONTRAST TECHNIQUE: Contiguous axial images were obtained from the base of the skull through the vertex without intravenous contrast. COMPARISON:  CT of 08/06/2016 and 06/27/2016. FINDINGS: Brain: There is no evidence of acute intracranial hemorrhage, mass lesion, brain edema or extra-axial fluid collection. There is diffuse prominence of the ventricles and subarachnoid spaces consistent with atrophy. Partially calcified right frontal meningioma appears unchanged, best seen on the reformatted images. There is no CT evidence of acute cortical infarction. Mild chronic  small vessel ischemic changes in the periventricular white matter are stable. Vascular: Intracranial vascular calcifications. No hyperdense vessel. Skull: Negative for fracture or focal lesion. Sinuses/Orbits: Probable small mucous retention cyst in the left frontal and left maxillary sinuses. There are no air-fluid levels. The mastoid air cells and middle ears are clear. There is a small hematoma along the left lateral orbital rim laterally which has contracted since the previous study. Other: None. IMPRESSION: 1. No acute intracranial or calvarial findings. 2. Contracting hematoma lateral to the left orbit. 3. Stable atrophy, chronic small vessel ischemic changes and right frontal lobe meningioma. Electronically Signed   By: Carey Bullocks M.D.   On: 08/14/2016 13:09    Procedures Procedures (including critical care time)  Medications Ordered in ED Medications  sodium chloride 0.9 % bolus 1,000 mL (0 mLs Intravenous Stopped 08/14/16 1416)     Initial Impression / Assessment and Plan / ED Course  I have reviewed the triage vital signs and the nursing notes.  Pertinent labs & imaging results that were available during my care of the patient were reviewed by me and considered in my medical decision making (see chart for details).     I advised the son and the patient that she will need to follow-up closely with her primary doctor.  Patient advised return here for any worsening in her condition.  Patient agrees the plan and all questions were answered.  At this point.  There is no definitive cause for her weakness.  She is not orthostatic.  She was given some IV fluids due to the fact that she has not been eating or drinking well over the past week  Final Clinical Impressions(s) / ED Diagnoses   Final diagnoses:  None    New Prescriptions New Prescriptions   No medications on file     Charlestine Night, PA-C 08/14/16 1517    Doug Sou, MD 08/14/16 [phone]

## 2016-08-14 NOTE — ED Triage Notes (Signed)
Pt here from assisted living with c/o gen weakness over the last 2 days , pt had falls recently , cbg 81

## 2016-08-21 ENCOUNTER — Ambulatory Visit (INDEPENDENT_AMBULATORY_CARE_PROVIDER_SITE_OTHER): Payer: Medicare Other | Admitting: Internal Medicine

## 2016-08-21 DIAGNOSIS — Z5181 Encounter for therapeutic drug level monitoring: Secondary | ICD-10-CM

## 2016-08-21 DIAGNOSIS — I4891 Unspecified atrial fibrillation: Secondary | ICD-10-CM

## 2016-08-21 LAB — POCT INR: INR: 2.2

## 2016-09-07 ENCOUNTER — Ambulatory Visit (INDEPENDENT_AMBULATORY_CARE_PROVIDER_SITE_OTHER): Payer: Medicare Other | Admitting: Pharmacist Clinician (PhC)/ Clinical Pharmacy Specialist

## 2016-09-07 DIAGNOSIS — I4891 Unspecified atrial fibrillation: Secondary | ICD-10-CM | POA: Diagnosis not present

## 2016-09-07 DIAGNOSIS — Z5181 Encounter for therapeutic drug level monitoring: Secondary | ICD-10-CM | POA: Diagnosis not present

## 2016-09-07 LAB — POCT INR: INR: 2.1

## 2016-09-08 ENCOUNTER — Telehealth: Payer: Self-pay | Admitting: Cardiology

## 2016-09-08 NOTE — Telephone Encounter (Signed)
We can decrease quinapril to 5 mg daily and monitor BP response.  Yates Weisgerber [place] MD, [facility]

## 2016-09-08 NOTE — Telephone Encounter (Signed)
Pt's son is concerned about pt taking too much Lasix in the morning. Pt's blood pressure is 90/56,thinks this might be causing her blood pressure to be low.

## 2016-09-08 NOTE — Telephone Encounter (Signed)
Order faxed to [facility] at fax # [phone] to decrease Quinapril to 5 mg daily.Check B/P daily.Call office if B/P remains low.

## 2016-09-08 NOTE — Telephone Encounter (Signed)
Returned call to patient's son Brett Canales.He stated mother is living at [facility] assisted living.[facility] has been going out twice a week.B/P yesterday 90/56.Stated she fell last week.He thinks her B/P has been too low.Spoke to med Research scientist (life sciences) at [employer].She stated patient's B/P's are taken monthly unless ordered by Dr.Patient's B/P first of this month 130/74.Advised I will send message to Dr.Jordan to see if he wants to decrease medications.

## 2016-09-09 ENCOUNTER — Telehealth: Payer: Self-pay

## 2016-09-09 NOTE — Telephone Encounter (Signed)
Order refaxed to [facility] at fax # [phone] to decrease Quinapril to 5 mg daily and to check B/P daily.Call office if B/P remains low.

## 2016-09-11 ENCOUNTER — Other Ambulatory Visit: Payer: Self-pay | Admitting: Cardiology

## 2016-09-14 ENCOUNTER — Telehealth: Payer: Self-pay | Admitting: Cardiology

## 2016-09-14 NOTE — Telephone Encounter (Signed)
New message    [facility] did not receieve the actual orders for the pharmacy. They would like it re faxed to [phone] attn: cindy

## 2016-09-14 NOTE — Telephone Encounter (Signed)
Pt's son calling regarding medication dosage change -nurse called  [facility] to confirm change but they also  need it in writing by fax to [phone]-also son said she was to be seen sometime soon and was told to call In May to schedule, per last ov 05-2016 she was to be seen in 4 months, so she is due in May, anywhere you can work her in or is she just to wait?

## 2016-09-14 NOTE — Telephone Encounter (Signed)
Refaxed order to [facility] at fax # [phone].

## 2016-09-15 NOTE — Telephone Encounter (Signed)
Returned call to patient's son Brett Canales 09/14/16.Advised order refaxed to [facility].Follow up appointment scheduled with Dr.Jordan 10/21/16 at 8:40 am.

## 2016-09-21 ENCOUNTER — Encounter: Payer: Self-pay | Admitting: Podiatry

## 2016-09-21 ENCOUNTER — Ambulatory Visit (INDEPENDENT_AMBULATORY_CARE_PROVIDER_SITE_OTHER): Payer: Medicare Other | Admitting: Podiatry

## 2016-09-21 VITALS — BP 111/64 | HR 51 | Resp 16

## 2016-09-21 DIAGNOSIS — M2041 Other hammer toe(s) (acquired), right foot: Secondary | ICD-10-CM | POA: Diagnosis not present

## 2016-09-21 DIAGNOSIS — L84 Corns and callosities: Secondary | ICD-10-CM | POA: Diagnosis not present

## 2016-09-23 NOTE — Progress Notes (Signed)
Subjective:    Patient ID: Gabriela Hernandez, female   DOB: 82 y.o.   MRN: [id number]   HPI patient presents with distal callus digit 2 right that is had some breakdown of tissue and some drainage but it's been local and nonpainful    Review of Systems  All other systems reviewed and are negative.       Objective:  Physical Exam  Constitutional: She is oriented to person, place, and time.  Cardiovascular: Intact distal pulses.   Neurological: She is alert and oriented to person, place, and time.  Skin: Skin is warm and dry.  Nursing note and vitals reviewed.  Neurovascular status found to be intact with muscle strength adequate range of motion within normal limits with patient noted to have a keratotic lesion distal aspect digit 2 right that's painful localized drainage and no proximal edema erythema noted. It is showing signs of slight subcutaneous exposure but localized and did not indicate any bone probe when I probed into the area     Assessment:    Chronic lesion second digit right secondary to foot structure with localized irritation and no proximal edema erythema drainage noted     Plan:   X-ray condition reviewed. Today I debrided the distal tip I advised on open toed shoes soaks and padding and it should resolve and if it should become increasingly red or swollen or other issues should occur it is possible that it may require amputation especially for turns out later on the bone infection. At this point I'm very hopeful this will be a very quick process and will heal uneventfully  X-ray report was negative for signs of osteomyelitis with elongated second digit

## 2016-10-13 ENCOUNTER — Ambulatory Visit (INDEPENDENT_AMBULATORY_CARE_PROVIDER_SITE_OTHER): Payer: Medicare Other | Admitting: Pharmacist

## 2016-10-13 ENCOUNTER — Telehealth: Payer: Self-pay

## 2016-10-13 DIAGNOSIS — Z5181 Encounter for therapeutic drug level monitoring: Secondary | ICD-10-CM

## 2016-10-13 DIAGNOSIS — I4891 Unspecified atrial fibrillation: Secondary | ICD-10-CM

## 2016-10-13 LAB — POCT INR: INR: 1.8

## 2016-10-13 NOTE — Telephone Encounter (Signed)
Received surgical clearance from [facility] Surgery in [facility].Dr.Jordan cleared patient for upcoming surgery.He advised ok to hold coumadin 5 days prior to surgery.Form faxed back to fax # [phone].

## 2016-10-14 ENCOUNTER — Other Ambulatory Visit: Payer: Self-pay | Admitting: Cardiology

## 2016-10-14 NOTE — Telephone Encounter (Signed)
Rx has been sent to the pharmacy electronically. ° °

## 2016-10-19 NOTE — Progress Notes (Signed)
Gabriela Hernandez Date of Birth: 1926/08/14   History of Present Illness: Gabriela Hernandez is seen today for followup CHF. She has a history of nonischemic cardiomyopathy with prior echo demonstrated an ejection fraction of 30-35%. Echocardiogram in October 2014 showed significant improvement in her ejection fraction to 55-60%. She has moderate mitral insufficiency. She also has a history of chronic atrial fibrillation and is on coumadin.  When seen in December she noted  increased LE edema and SOB. Her lasix dose was increased and she was switched to [facility].  No chest pain or dizziness. On her last follow up swelling was better. Echo showed normal EF so Entresto switched back to ACEi.  She was admitted in February with E. Coli sepsis/UTI. She was hypotensive and had some rhabdomyolysis and troponin elevation. Also had AKI. This all improved with antibiotics. DC to [facility] for 2 weeks. Now living at [facility] assisted living. Seen in ED twice in March- once after a fall and left temporal laceration. The second time for generalized weakness without obvious cause.   She is seen today with her son. Overall doing well. Mild swelling in right leg. Is having surgery on her right eyelid on Monday by Plastic surgery. Coumadin on hold after today. She did receive 10 weeks of PT- now exercising on her own. Notes some dyspnea on exercise. Notes some chronic difficulty with swallowing. No chest pain, dizziness, palpitations, orthopnea, or PND.   Current Outpatient Prescriptions on File Prior to Visit  Medication Sig Dispense Refill  . bacitracin 500 UNIT/GM ointment Apply 1 application topically 2 (two) times daily.    Marland Kitchen docusate sodium (COLACE) 100 MG capsule Take 200 mg by mouth daily.    Marland Kitchen ENSURE (ENSURE) Take 237 mLs by mouth 2 (two) times daily between meals. STRAWBERRY    . Multiple Vitamins-Minerals (MULTIVITAMIN WITH MINERALS) tablet Take 1 tablet by mouth daily.    . potassium chloride  (K-DUR,KLOR-CON) 10 MEQ tablet Take 10 mEq by mouth daily.    . quinapril (ACCUPRIL) 10 MG tablet TAKE 1/2 TABLET(5MG ) BY MOUTH ONCE A DAY. 15 tablet 9  . senna (SENOKOT) 8.6 MG tablet Take 1-2 tablets by mouth daily as needed for constipation.    . traMADol (ULTRAM) 50 MG tablet Take 1 tablet (50 mg total) by mouth every 6 (six) hours as needed (pain unrelieved by tylenol). 30 tablet 0  . warfarin (COUMADIN) 5 MG tablet TAKE 1 TABLET BY MOUTH ONCE A DAY. 30 tablet 1   No current facility-administered medications on file prior to visit.     No Known Allergies  Past Medical History:  Diagnosis Date  . Atrial fibrillation (HCC)   . Chronic systolic CHF (congestive heart failure) (HCC) 04/05/2012  . Dilated cardiomyopathy (HCC)    with ejection fraction of 30-35%  . Dysphagia   . Esophageal stricture   . History of shingles   . Hypertension   . Hypothyroidism   . Lightheadedness     Past Surgical History:  Procedure Laterality Date  . APPENDECTOMY    . CATARACT EXTRACTION    . CESAREAN SECTION    . FOOT SURGERY    . THYROIDECTOMY    . TONSILLECTOMY    . VEIN LIGATION      History  Smoking Status  . Never Smoker  Smokeless Tobacco  . Never Used    History  Alcohol Use No    Family History  Problem Relation Age of Onset  . Hypertension Mother   .  Heart attack Father     Review of Systems:  All other systems  as noted in history of present illness. All other systems were reviewed and are negative.  Physical Exam: BP 104/64   Pulse 62   Ht 5\' 5"  (1.651 m)   Wt 122 lb 3.2 oz (55.4 kg)   BMI 20.34 kg/m  She is a pleasant elderly white female in no acute distress. HEENT exam is remarkable for drooping lower lid on the left.   Neck is supple with  JVD 4 cm, no adenopathy, thyromegaly, or bruits. Lungs are clear. Cardiac exam reveals a grade 1-2/6 systolic murmur at apex. Pulse is irregular. There is no S3. Abdomen is soft and nontender without masses. She has 1+  edema R>L. She does have diffuse superficial varicosities in her lower  Extremities.   Pedal pulses are palpable. Neurologic exam is nonfocal.  LABORATORY DATA: Lab Results  Component Value Date   WBC 6.0 08/14/2016   HGB 11.4 (L) 08/14/2016   HCT 35.8 (L) 08/14/2016   PLT 146 (L) 08/14/2016   GLUCOSE 76 08/14/2016   ALT 15 07/13/2016   AST 23 07/13/2016   NA 139 08/14/2016   K 3.9 08/14/2016   CL 101 08/14/2016   CREATININE 0.61 08/14/2016   BUN 15 08/14/2016   CO2 30 08/14/2016   TSH 1.168 06/27/2016   INR 1.8 10/13/2016    Echo: 05/20/16: Study Conclusions  - Left ventricle: Systolic function was normal. The estimated   ejection fraction was in the range of 55% to 60%. Wall motion was   normal; there were no regional wall motion abnormalities. - Mitral valve: There was mild to moderate regurgitation directed   centrally. - Left atrium: The atrium was massively dilated. - Right atrium: The atrium was mildly dilated. - Atrial septum: The septum bowed from left to right, consistent   with increased left atrial pressure. No defect or patent foramen   ovale was identified. - Pericardium, extracardiac: A small pericardial effusion was   identified posterior to the heart. There was no evidence of   hemodynamic compromise.   Assessment / Plan: 1. Chronic diastolic congestive heart failure - echocardiogram in January  showed good LV function and only mild to moderate MR.  Edema is improved and weight is down 2 lbs.  Will continue lasix at current dose of 20 mg bid. She has been forcing fluids every day drinking 84 oz of water. I don't think she needs to force fluid- just an adequate intake to maintain hydration.  2. Atrial fibrillation. Rate is well controlled. We had previously reduced her carvedilol dose because of bradycardia and this has improved. Continue anticoagulation with Coumadin- except hold for upcoming eyelid surgery.  3. Hypertension, controlled.  I will follow up  in 4 months.

## 2016-10-21 ENCOUNTER — Ambulatory Visit (INDEPENDENT_AMBULATORY_CARE_PROVIDER_SITE_OTHER): Payer: Medicare Other | Admitting: Cardiology

## 2016-10-21 VITALS — BP 104/64 | HR 62 | Ht 65.0 in | Wt 122.2 lb

## 2016-10-21 DIAGNOSIS — I482 Chronic atrial fibrillation, unspecified: Secondary | ICD-10-CM

## 2016-10-21 DIAGNOSIS — I10 Essential (primary) hypertension: Secondary | ICD-10-CM | POA: Diagnosis not present

## 2016-10-21 DIAGNOSIS — I5032 Chronic diastolic (congestive) heart failure: Secondary | ICD-10-CM | POA: Diagnosis not present

## 2016-10-21 NOTE — Patient Instructions (Addendum)
Continue your current therapy  I will see you in 4 months  

## 2016-11-05 ENCOUNTER — Ambulatory Visit (INDEPENDENT_AMBULATORY_CARE_PROVIDER_SITE_OTHER): Payer: Medicare Other | Admitting: *Deleted

## 2016-11-05 DIAGNOSIS — Z5181 Encounter for therapeutic drug level monitoring: Secondary | ICD-10-CM

## 2016-11-05 DIAGNOSIS — I4891 Unspecified atrial fibrillation: Secondary | ICD-10-CM

## 2016-11-05 LAB — POCT INR: INR: 1.5

## 2016-11-17 ENCOUNTER — Ambulatory Visit (INDEPENDENT_AMBULATORY_CARE_PROVIDER_SITE_OTHER): Payer: Medicare Other | Admitting: Pharmacist

## 2016-11-17 DIAGNOSIS — I4891 Unspecified atrial fibrillation: Secondary | ICD-10-CM | POA: Diagnosis not present

## 2016-11-17 DIAGNOSIS — Z5181 Encounter for therapeutic drug level monitoring: Secondary | ICD-10-CM | POA: Diagnosis not present

## 2016-11-17 DIAGNOSIS — I10 Essential (primary) hypertension: Secondary | ICD-10-CM

## 2016-11-17 LAB — POCT INR: INR: 2.3

## 2016-11-17 NOTE — Patient Instructions (Signed)
It was nice to see you today  Decrease your carvedilol to 6.25mg  twice a day  Continue taking your other medications  Follow up with blood pressure check in a month at your next Coumadin visit

## 2016-11-17 NOTE — Progress Notes (Signed)
Patient ID: Gabriela Hernandez                 DOB: 04-22-1927                      MRN: [id number]     HPI: Gabriela Hernandez is a 83 y.o. female patient of Dr Swaziland who referred herself to HTN clinic - at most recent Coumadin check on 11/05/16, pt had BP concerns and appt was made for today. PMH is significant for nonischemic cardiomyopathy with EF of 55-60% in January 2018, chronic afib on Coumadin, HTN, and hypothyroidism. She presents today with her son.  Pt resides at [facility] and her blood pressure is checked every day there. Originally, her BP was checked in the morning ranging from 8-10am. Her readings were consistent at 110/60-135/70. Her nurse then started checking her BP at 1:30pm and her son brings in readings today from the past 2 weeks. Every BP reading has ranged in the 90s/50s to a high of 104/60. Pt has been feeling more fatigued and has been taking naps in the afternoon. Her son reports that she has been more fatigued as well. She receives her carvedilol at 12pm and 8pm and they are wondering if her dose is too high. Her HR have not been reported next to her BP readings, but HR checked a few times in clinic ranged 48-52.  Current HTN meds: carvedilol 12.5mg  BID (12pm and 8pm), quinapril 5mg  daily (early AM), furosemide 20mg  BID (early AM for first dose) Previously tried: Entresto when LVEF was < 40% in the past BP goal: <140/40mmHg  Family History: Mother with HTN, father with MI.  Social History: Denies tobacco and alcohol use.  Wt Readings from Last 3 Encounters:  10/21/16 122 lb 3.2 oz (55.4 kg)  07/15/16 131 lb (59.4 kg)  07/09/16 132 lb 4.8 oz (60 kg)   BP Readings from Last 3 Encounters:  10/21/16 104/64  09/21/16 111/64  08/14/16 (!) 168/96   Pulse Readings from Last 3 Encounters:  10/21/16 62  09/21/16 (!) 51  08/14/16 77    Renal function: CrCl cannot be calculated (Patient's most recent lab result is older than the maximum 21 days allowed.).  Past  Medical History:  Diagnosis Date  . Atrial fibrillation (HCC)   . Chronic systolic CHF (congestive heart failure) (HCC) 04/05/2012  . Dilated cardiomyopathy (HCC)    with ejection fraction of 30-35%  . Dysphagia   . Esophageal stricture   . History of shingles   . Hypertension   . Hypothyroidism   . Lightheadedness     Current Outpatient Prescriptions on File Prior to Visit  Medication Sig Dispense Refill  . bacitracin 500 UNIT/GM ointment Apply 1 application topically 2 (two) times daily.    . carvedilol (COREG) 12.5 MG tablet Take 1 tablet by mouth 2 (two) times daily.    Marland Kitchen docusate sodium (COLACE) 100 MG capsule Take 200 mg by mouth daily.    Marland Kitchen ENSURE (ENSURE) Take 237 mLs by mouth 2 (two) times daily between meals. STRAWBERRY    . furosemide (LASIX) 20 MG tablet Take 20 mg by mouth 2 (two) times daily.    Marland Kitchen levothyroxine (SYNTHROID, LEVOTHROID) 125 MCG tablet Take 1 tablet by mouth daily.    . Multiple Vitamins-Minerals (MULTIVITAMIN WITH MINERALS) tablet Take 1 tablet by mouth daily.    . potassium chloride (K-DUR,KLOR-CON) 10 MEQ tablet Take 10 mEq by mouth daily.    Marland Kitchen  quinapril (ACCUPRIL) 10 MG tablet TAKE 1/2 TABLET(5MG ) BY MOUTH ONCE A DAY. 15 tablet 9  . senna (SENOKOT) 8.6 MG tablet Take 1-2 tablets by mouth daily as needed for constipation.    . traMADol (ULTRAM) 50 MG tablet Take 1 tablet (50 mg total) by mouth every 6 (six) hours as needed (pain unrelieved by tylenol). 30 tablet 0  . warfarin (COUMADIN) 5 MG tablet TAKE 1 TABLET BY MOUTH ONCE A DAY. 30 tablet 1   No current facility-administered medications on file prior to visit.     No Known Allergies   Assessment/Plan:  1. Hypertension - BP readings consistently low in the 90s to low 100s systolic on daily checks at [facility] and in clinic today with HR in the upper 40s to low 50s. This is likely causing patient's fatigue. BP goal < 140/32mmHg given patient's age. Low BP readings also correlate appropriately  ~1.5-2 hrs after she takes her first dose of carvedilol at 12pm. Will decrease carvedilol to 6.25mg  BID and continue quinapril 5mg  daily and furosemide 20mg  BID. Orders written for [facility]. F/u in HTN clinic in 1 month at next Coumadin check.  Anielle Headrick E. Temple Sporer, PharmD, CPP, BCACP [facility]  [address] [place], [place] [address] Phone: [phone]; Fax: [phone] 11/17/2016 2:19 PM

## 2016-12-22 ENCOUNTER — Ambulatory Visit (INDEPENDENT_AMBULATORY_CARE_PROVIDER_SITE_OTHER): Payer: Medicare Other

## 2016-12-22 ENCOUNTER — Ambulatory Visit (INDEPENDENT_AMBULATORY_CARE_PROVIDER_SITE_OTHER): Payer: Medicare Other | Admitting: Pharmacist

## 2016-12-22 VITALS — BP 114/58 | HR 55

## 2016-12-22 DIAGNOSIS — I10 Essential (primary) hypertension: Secondary | ICD-10-CM

## 2016-12-22 DIAGNOSIS — I4891 Unspecified atrial fibrillation: Secondary | ICD-10-CM

## 2016-12-22 DIAGNOSIS — Z5181 Encounter for therapeutic drug level monitoring: Secondary | ICD-10-CM

## 2016-12-22 LAB — POCT INR: INR: 2.3

## 2016-12-22 NOTE — Patient Instructions (Addendum)
It was nice to see you today!  Decrease your carvedilol to 3.125mg  twice a day  Continue your other medications  Recheck blood pressure in 1 month at your next Coumadin check in 5 weeks

## 2016-12-22 NOTE — Progress Notes (Signed)
Patient ID: [name]                 DOB: [birth year]                      MRN: [id number]     HPI: Gabriela Hernandez is a 83 y.o. female patient of Dr Swaziland who referred herself to HTN clinic who presents for follow up. PMH is significant for nonischemic cardiomyopathy with EF of 55-60% in January 2018, chronic afib on Coumadin, HTN, and hypothyroidism. At last BP check on 11/17/16, carvedilol dose was decreased to 6.25mg  BID due to low BP readings and HR in the 40-50s with pt complaints of fatigue. She presents today with her son for follow up.  Pt reports feeling better since decreasing her dose of carvedilol. She is still tired a bit, but her dizziness has improved (although still present). BP readings checked at [facility] ~2 hrs after 12pm carvedilol dose include: 102/60, 106/64, 100/60, 96/61, 111/72, 100/73. They have not been checking HR at [facility]. HR in clinic ranges 55-62. BP readings are improved from last month when pt was taking carvedilol 12.5mg  BID and BP were mostly in the 90s/50s.  Current HTN meds: carvedilol 6.25mg  BID (12pm and 8pm), quinapril 5mg  daily (8AM), furosemide 20mg  BID (~5AM for first dose) Previously tried: Entresto when LVEF was < 40% in the past, carvedilol 12.5mg  BID - bradycardia and fatigue BP goal: <140/57mmHg  Family History: Mother with HTN, father with MI.  Social History: Denies tobacco and alcohol use.  Wt Readings from Last 3 Encounters:  10/21/16 122 lb 3.2 oz (55.4 kg)  07/15/16 131 lb (59.4 kg)  07/09/16 132 lb 4.8 oz (60 kg)   BP Readings from Last 3 Encounters:  11/17/16 (!) 106/58  10/21/16 104/64  09/21/16 111/64   Pulse Readings from Last 3 Encounters:  11/17/16 (!) 50  10/21/16 62  09/21/16 (!) 51    Renal function: CrCl cannot be calculated (Patient's most recent lab result is older than the maximum 21 days allowed.).  Past Medical History:  Diagnosis Date  . Atrial fibrillation (HCC)   . Chronic systolic  CHF (congestive heart failure) (HCC) 04/05/2012  . Dilated cardiomyopathy (HCC)    with ejection fraction of 30-35%  . Dysphagia   . Esophageal stricture   . History of shingles   . Hypertension   . Hypothyroidism   . Lightheadedness     Current Outpatient Prescriptions on File Prior to Visit  Medication Sig Dispense Refill  . bacitracin 500 UNIT/GM ointment Apply 1 application topically 2 (two) times daily.    . carvedilol (COREG) 6.25 MG tablet Take 1 tablet (6.25 mg total) by mouth 2 (two) times daily.    Marland Kitchen docusate sodium (COLACE) 100 MG capsule Take 200 mg by mouth daily.    Marland Kitchen ENSURE (ENSURE) Take 237 mLs by mouth 2 (two) times daily between meals. STRAWBERRY    . furosemide (LASIX) 20 MG tablet Take 20 mg by mouth 2 (two) times daily.    Marland Kitchen levothyroxine (SYNTHROID, LEVOTHROID) 125 MCG tablet Take 1 tablet by mouth daily.    . Multiple Vitamins-Minerals (MULTIVITAMIN WITH MINERALS) tablet Take 1 tablet by mouth daily.    . potassium chloride (K-DUR,KLOR-CON) 10 MEQ tablet Take 10 mEq by mouth daily.    . quinapril (ACCUPRIL) 10 MG tablet TAKE 1/2 TABLET(5MG ) BY MOUTH ONCE A DAY. 15 tablet 9  . senna (SENOKOT) 8.6 MG tablet Take  1-2 tablets by mouth daily as needed for constipation.    . traMADol (ULTRAM) 50 MG tablet Take 1 tablet (50 mg total) by mouth every 6 (six) hours as needed (pain unrelieved by tylenol). 30 tablet 0  . warfarin (COUMADIN) 5 MG tablet TAKE 1 TABLET BY MOUTH ONCE A DAY. 30 tablet 1   No current facility-administered medications on file prior to visit.     No Known Allergies   Assessment/Plan:  1. Hypertension - BP has improved to the low 100s/60-70s since decreasing carvedilol dose and HR has increased to the mid 50s. Pt reports her dizziness has improved although she is still fatigued. Will decrease carvedilol to 3.125mg  BID due to low BP, HR, pt symptoms, and advanced age. Will continue quinapril 5mg  daily and furosemide 20mg  BID. F/u BP check in 5  weeks at pt's next Coumadin check. [facility] will continue to monitor daily BP readings ~2pm.   Lyda Colcord E. Satoshi Kalas, PharmD, CPP, BCACP [facility] [address] [place], [place] [address] Phone: [phone]; Fax: [phone] 12/22/2016 9:49 AM

## 2016-12-28 ENCOUNTER — Other Ambulatory Visit: Payer: Self-pay | Admitting: Internal Medicine

## 2017-01-18 ENCOUNTER — Other Ambulatory Visit: Payer: Self-pay | Admitting: Cardiology

## 2017-01-19 NOTE — Telephone Encounter (Signed)
REFILL 

## 2017-01-26 ENCOUNTER — Ambulatory Visit (INDEPENDENT_AMBULATORY_CARE_PROVIDER_SITE_OTHER): Payer: Medicare Other | Admitting: Pharmacist

## 2017-01-26 VITALS — BP 114/64 | HR 72 | Wt 126.0 lb

## 2017-01-26 DIAGNOSIS — I10 Essential (primary) hypertension: Secondary | ICD-10-CM

## 2017-01-26 DIAGNOSIS — Z5181 Encounter for therapeutic drug level monitoring: Secondary | ICD-10-CM

## 2017-01-26 DIAGNOSIS — I4891 Unspecified atrial fibrillation: Secondary | ICD-10-CM | POA: Diagnosis not present

## 2017-01-26 LAB — POCT INR: INR: 1.7

## 2017-01-26 NOTE — Progress Notes (Signed)
Patient ID: Gabriela Hernandez                 DOB: 1927/01/23                      MRN: [id number]      HPI: Gabriela Hernandez is a 83 y.o. female patient of Dr. Swaziland who referred herself to HTN clinic who presents for follow-up. PMH is significant for nonischemic cardiomyopathy with EF of 55-60% in January 2018, chronic afib on Coumadin, HTN, and hypothyroidism.   At her last BP check on 12/22/16 her blood pressure was well-controlled at 114/58 mmHg. Her carvedilol was decreased from 6.25 mg twice daily to 3.125 mg twice daily due to increased fatigue and bradycardia.  She presents today feeling better since decreasing her dose of carvedilol to 3.125 twice daily. She reports her fatigue has improved, as well as her dizziness. They have been checking her BP at [facility] (~2pm) and they are around ~110/60 mmHg. She has even been feeling up to her exercise classes at her nursing home. She states that she is comfortable with the medication regimen she is on and wishes to continue her current regimen.   Current HTN meds:  Carvedilol 3.125 mg twice daily (12pm and 8pm) Quinapril 5 mg daily (8am) Furosemide 20 mg twice daily (5am for first dose)  Previously tried: carvedilol 12.5 mg twice daily, carvedilol 6.25 mg twice daily  BP goal: <140/49mmHg  Family History: Mother with HTN, father with MI.  Social History: Denies tobacco and alcohol use.  Diet: Has been eating more greens lately at the nursing home - hard to get consistency she states.  Wt Readings from Last 3 Encounters:  10/21/16 122 lb 3.2 oz (55.4 kg)  07/15/16 131 lb (59.4 kg)  07/09/16 132 lb 4.8 oz (60 kg)   BP Readings from Last 3 Encounters:  12/22/16 (!) 114/58  11/17/16 (!) 106/58  10/21/16 104/64   Pulse Readings from Last 3 Encounters:  12/22/16 (!) 55  11/17/16 (!) 50  10/21/16 62    Renal function: CrCl cannot be calculated (Patient's most recent lab result is older than the maximum 21 days  allowed.).  Past Medical History:  Diagnosis Date  . Atrial fibrillation (HCC)   . Chronic systolic CHF (congestive heart failure) (HCC) 04/05/2012  . Dilated cardiomyopathy (HCC)    with ejection fraction of 30-35%  . Dysphagia   . Esophageal stricture   . History of shingles   . Hypertension   . Hypothyroidism   . Lightheadedness     Current Outpatient Prescriptions on File Prior to Visit  Medication Sig Dispense Refill  . bacitracin 500 UNIT/GM ointment Apply 1 application topically 2 (two) times daily.    . carvedilol (COREG) 3.125 MG tablet TAKE (1) TABLET BY MOUTH TWICE DAILY. 60 tablet 6  . docusate sodium (COLACE) 100 MG capsule Take 200 mg by mouth daily.    Marland Kitchen ENSURE (ENSURE) Take 237 mLs by mouth 2 (two) times daily between meals. STRAWBERRY    . furosemide (LASIX) 20 MG tablet Take 20 mg by mouth 2 (two) times daily.    Marland Kitchen levothyroxine (SYNTHROID, LEVOTHROID) 125 MCG tablet Take 1 tablet by mouth daily.    . Multiple Vitamins-Minerals (MULTIVITAMIN WITH MINERALS) tablet Take 1 tablet by mouth daily.    . potassium chloride (K-DUR,KLOR-CON) 10 MEQ tablet Take 10 mEq by mouth daily.    . quinapril (ACCUPRIL) 10 MG tablet TAKE 1/2  TABLET(5MG ) BY MOUTH ONCE A DAY. 15 tablet 9  . senna (SENOKOT) 8.6 MG tablet Take 1-2 tablets by mouth daily as needed for constipation.    . traMADol (ULTRAM) 50 MG tablet Take 1 tablet (50 mg total) by mouth every 6 (six) hours as needed (pain unrelieved by tylenol). 30 tablet 0  . warfarin (COUMADIN) 5 MG tablet TAKE (1) TABLET BY MOUTH ONCE DAILY. 30 tablet 3   No current facility-administered medications on file prior to visit.     No Known Allergies   Assessment/Plan: Hypertension: Blood pressure is within goal today of <140/90 mmHg and has improved to 114/64 mmHg since decreasing her carvedilol dose last visit. Her heart rate is also now within normal limits at 72 bpm. As her dizziness, fatigue, and vitals have improved, will continue  current regimen of carvedilol 3.125 mg twice daily (12pm and 8pm), quinapril 5 mg daily (8am), and furosemide 20 mg twice daily (5am for first dose).  Follow-up in blood pressure clinic as needed. [facility] will continue to monitor daily BP readings.   Diana L. Marcy Salvo, PharmD, MS PGY1 Pharmacy Resident [facility]

## 2017-02-10 ENCOUNTER — Encounter: Payer: Self-pay | Admitting: Cardiology

## 2017-02-15 NOTE — Progress Notes (Signed)
Gabriela Hernandez Date of Birth: 1926/10/12   History of Present Illness: Mrs. Forst is seen today for followup CHF. She has a history of nonischemic cardiomyopathy with prior echo demonstrated an ejection fraction of 30-35%. Echocardiogram in October 2014 showed significant improvement in her ejection fraction to 55-60%. She has moderate mitral insufficiency. She also has a history of chronic atrial fibrillation and is on coumadin.  When seen in December she noted  increased LE edema and SOB. Her lasix dose was increased.  Echo showed normal EF so Entresto switched back to ACEi.  She was admitted in February with E. Coli sepsis/UTI. She was hypotensive and had some rhabdomyolysis and troponin elevation. Also had AKI. This all improved with antibiotics. DC to [facility] for 2 weeks. Now living at [facility] assisted living. Seen in ED twice in March- once after a fall and left temporal laceration. The second time for generalized weakness without obvious cause.   She is seen today with her son. She is doing well. She still has some leg swelling but it has improved. She doesn't like to wear support hose because it makes her feet draw and hurt. She is participating in exercise classes at [facility]. Notes blood work done Monday with Dr Clovis Riley and was OK.     Current Outpatient Prescriptions on File Prior to Visit  Medication Sig Dispense Refill  . carvedilol (COREG) 3.125 MG tablet TAKE (1) TABLET BY MOUTH TWICE DAILY. 60 tablet 6  . docusate sodium (COLACE) 100 MG capsule Take 200 mg by mouth daily.    . furosemide (LASIX) 20 MG tablet Take 20 mg by mouth 2 (two) times daily.    Marland Kitchen levothyroxine (SYNTHROID, LEVOTHROID) 125 MCG tablet Take 1 tablet by mouth daily.    . Multiple Vitamins-Minerals (MULTIVITAMIN WITH MINERALS) tablet Take 1 tablet by mouth daily.    . potassium chloride (K-DUR,KLOR-CON) 10 MEQ tablet Take 10 mEq by mouth daily.    . quinapril (ACCUPRIL) 10 MG tablet  TAKE 1/2 TABLET(5MG ) BY MOUTH ONCE A DAY. 15 tablet 9  . warfarin (COUMADIN) 5 MG tablet TAKE (1) TABLET BY MOUTH ONCE DAILY. 30 tablet 3   No current facility-administered medications on file prior to visit.     Allergies  Allergen Reactions  . Aspirin     Past Medical History:  Diagnosis Date  . Atrial fibrillation (HCC)   . Chronic systolic CHF (congestive heart failure) (HCC) 04/05/2012  . Dilated cardiomyopathy (HCC)    with ejection fraction of 30-35%  . Dysphagia   . Esophageal stricture   . History of shingles   . Hypertension   . Hypothyroidism   . Lightheadedness     Past Surgical History:  Procedure Laterality Date  . APPENDECTOMY    . CATARACT EXTRACTION    . CESAREAN SECTION    . FOOT SURGERY    . THYROIDECTOMY    . TONSILLECTOMY    . VEIN LIGATION      History  Smoking Status  . Never Smoker  Smokeless Tobacco  . Never Used    History  Alcohol Use No    Family History  Problem Relation Age of Onset  . Hypertension Mother   . Heart attack Father     Review of Systems:  All other systems  as noted in history of present illness. All other systems were reviewed and are negative.  Physical Exam: BP 125/75   Pulse 63   Ht 5\' 5"  (1.651  m)   Wt 125 lb 9.6 oz (57 kg)   BMI 20.90 kg/m  GENERAL:  Well appearing, elderly WF in NAD HEENT:  PERRL, EOMI, sclera are clear. Oropharynx is clear. NECK:  No jugular venous distention, carotid upstroke brisk and symmetric, no bruits, no thyromegaly or adenopathy LUNGS:  Clear to auscultation bilaterally CHEST:  Unremarkable HEART:  IRRR,  PMI not displaced or sustained,S1 and S2 within normal limits, no S3, no S4: no clicks, no rubs, gr 1-2/6 systolic murmur LSB. ABD:  Soft, nontender. BS +, no masses or bruits. No hepatomegaly, no splenomegaly EXT:  2 + pulses throughout, 1+ bilateral edema, no cyanosis no clubbing SKIN:  Warm and dry.  No rashes NEURO:  Alert and oriented x 3. Cranial nerves II  through XII intact. PSYCH:  Cognitively intact    LABORATORY DATA: Lab Results  Component Value Date   WBC 6.0 08/14/2016   HGB 11.4 (L) 08/14/2016   HCT 35.8 (L) 08/14/2016   PLT 146 (L) 08/14/2016   GLUCOSE 76 08/14/2016   ALT 15 07/13/2016   AST 23 07/13/2016   NA 139 08/14/2016   K 3.9 08/14/2016   CL 101 08/14/2016   CREATININE 0.61 08/14/2016   BUN 15 08/14/2016   CO2 30 08/14/2016   TSH 1.168 06/27/2016   INR 1.7 01/26/2017  Labs dated 02/15/17: normal CMET. TSH 0.33.   Echo: 05/20/16: Study Conclusions  - Left ventricle: Systolic function was normal. The estimated   ejection fraction was in the range of 55% to 60%. Wall motion was   normal; there were no regional wall motion abnormalities. - Mitral valve: There was mild to moderate regurgitation directed   centrally. - Left atrium: The atrium was massively dilated. - Right atrium: The atrium was mildly dilated. - Atrial septum: The septum bowed from left to right, consistent   with increased left atrial pressure. No defect or patent foramen   ovale was identified. - Pericardium, extracardiac: A small pericardial effusion was   identified posterior to the heart. There was no evidence of   hemodynamic compromise.   Assessment / Plan: 1. Chronic diastolic congestive heart failure - echocardiogram in January  showed good LV function and only mild to moderate MR.  Edema is improved and weight is stable.  Will continue lasix at current dose of 20 mg bid. Keep feet elevated when possible. I encourage her to continue exercise program.  2. Atrial fibrillation. Rate is well controlled.  Continue anticoagulation with Coumadin. On Coreg for rate control.  3. Hypertension, controlled.  I will follow up in 6 months.

## 2017-02-18 ENCOUNTER — Ambulatory Visit (INDEPENDENT_AMBULATORY_CARE_PROVIDER_SITE_OTHER): Payer: Medicare Other | Admitting: Cardiology

## 2017-02-18 ENCOUNTER — Ambulatory Visit (INDEPENDENT_AMBULATORY_CARE_PROVIDER_SITE_OTHER): Payer: Medicare Other | Admitting: Pharmacist

## 2017-02-18 ENCOUNTER — Encounter: Payer: Self-pay | Admitting: Cardiology

## 2017-02-18 VITALS — BP 125/75 | HR 63 | Ht 65.0 in | Wt 125.6 lb

## 2017-02-18 DIAGNOSIS — Z5181 Encounter for therapeutic drug level monitoring: Secondary | ICD-10-CM | POA: Diagnosis not present

## 2017-02-18 DIAGNOSIS — I10 Essential (primary) hypertension: Secondary | ICD-10-CM

## 2017-02-18 DIAGNOSIS — I482 Chronic atrial fibrillation, unspecified: Secondary | ICD-10-CM

## 2017-02-18 DIAGNOSIS — I4891 Unspecified atrial fibrillation: Secondary | ICD-10-CM | POA: Diagnosis not present

## 2017-02-18 DIAGNOSIS — I5032 Chronic diastolic (congestive) heart failure: Secondary | ICD-10-CM

## 2017-02-18 LAB — POCT INR: INR: 1.7

## 2017-02-18 NOTE — Patient Instructions (Signed)
Continue your current therapy  Keep feet elevated when possible.  Limit salt intake.

## 2017-02-23 NOTE — Addendum Note (Signed)
Addended by: Armen Pickup T on: 02/23/2017 08:23 AM   Modules accepted: Orders

## 2017-03-11 ENCOUNTER — Ambulatory Visit (INDEPENDENT_AMBULATORY_CARE_PROVIDER_SITE_OTHER): Payer: Medicare Other | Admitting: *Deleted

## 2017-03-11 DIAGNOSIS — Z5181 Encounter for therapeutic drug level monitoring: Secondary | ICD-10-CM | POA: Diagnosis not present

## 2017-03-11 DIAGNOSIS — I4891 Unspecified atrial fibrillation: Secondary | ICD-10-CM | POA: Diagnosis not present

## 2017-03-11 LAB — POCT INR: INR: 2

## 2017-04-13 ENCOUNTER — Ambulatory Visit (INDEPENDENT_AMBULATORY_CARE_PROVIDER_SITE_OTHER): Payer: Medicare Other | Admitting: Pharmacist

## 2017-04-13 DIAGNOSIS — I4891 Unspecified atrial fibrillation: Secondary | ICD-10-CM

## 2017-04-13 DIAGNOSIS — Z5181 Encounter for therapeutic drug level monitoring: Secondary | ICD-10-CM

## 2017-04-13 LAB — POCT INR: INR: 3.4

## 2017-04-13 NOTE — Patient Instructions (Signed)
No warfarin today then continue 1 tablet (5mg ) daily except 1.5 tablet (7.5mg ) every Thursday. Recheck INR in 3 weeks. Will fax order to [facility] #[phone]

## 2017-04-22 ENCOUNTER — Other Ambulatory Visit: Payer: Self-pay | Admitting: Cardiology

## 2017-05-04 ENCOUNTER — Ambulatory Visit (INDEPENDENT_AMBULATORY_CARE_PROVIDER_SITE_OTHER): Payer: Medicare Other | Admitting: *Deleted

## 2017-05-04 DIAGNOSIS — I4891 Unspecified atrial fibrillation: Secondary | ICD-10-CM | POA: Diagnosis not present

## 2017-05-04 DIAGNOSIS — Z5181 Encounter for therapeutic drug level monitoring: Secondary | ICD-10-CM | POA: Diagnosis not present

## 2017-05-04 LAB — POCT INR: INR: 2.5

## 2017-05-04 NOTE — Patient Instructions (Signed)
Description   Continue taking 1 tablet (5mg ) daily except 1.5 tablet (7.5mg ) every Thursday. Recheck INR in 4 weeks. Will fax order to [facility] #[phone]

## 2017-06-04 ENCOUNTER — Ambulatory Visit (INDEPENDENT_AMBULATORY_CARE_PROVIDER_SITE_OTHER): Payer: Medicare Other | Admitting: *Deleted

## 2017-06-04 DIAGNOSIS — Z5181 Encounter for therapeutic drug level monitoring: Secondary | ICD-10-CM | POA: Diagnosis not present

## 2017-06-04 DIAGNOSIS — I4891 Unspecified atrial fibrillation: Secondary | ICD-10-CM | POA: Diagnosis not present

## 2017-06-04 LAB — POCT INR: INR: 2.2

## 2017-06-04 NOTE — Patient Instructions (Signed)
Description   Continue taking 1 tablet (5mg) daily except 1.5 tablet (7.5mg) every Thursday. Recheck INR in 4 weeks. Will fax order to [facility] #[phone]     

## 2017-07-06 ENCOUNTER — Ambulatory Visit (INDEPENDENT_AMBULATORY_CARE_PROVIDER_SITE_OTHER): Payer: Medicare Other | Admitting: *Deleted

## 2017-07-06 DIAGNOSIS — I4891 Unspecified atrial fibrillation: Secondary | ICD-10-CM

## 2017-07-06 DIAGNOSIS — Z5181 Encounter for therapeutic drug level monitoring: Secondary | ICD-10-CM

## 2017-07-06 LAB — POCT INR: INR: 2.1

## 2017-07-06 NOTE — Patient Instructions (Addendum)
Description   Continue taking 1 tablet (5mg ) daily except 1.5 tablet (7.5mg ) every Thursday. Recheck INR in 6 weeks. Faxed order to [facility] [phone]

## 2017-07-12 ENCOUNTER — Other Ambulatory Visit: Payer: Self-pay | Admitting: Cardiology

## 2017-08-17 ENCOUNTER — Ambulatory Visit (INDEPENDENT_AMBULATORY_CARE_PROVIDER_SITE_OTHER): Payer: Medicare Other | Admitting: *Deleted

## 2017-08-17 DIAGNOSIS — I4891 Unspecified atrial fibrillation: Secondary | ICD-10-CM

## 2017-08-17 DIAGNOSIS — Z5181 Encounter for therapeutic drug level monitoring: Secondary | ICD-10-CM | POA: Diagnosis not present

## 2017-08-17 LAB — POCT INR: INR: 2.5

## 2017-08-17 NOTE — Patient Instructions (Addendum)
Description   Continue taking 1 tablet (5mg ) daily except 1.5 tablet (7.5mg ) every Thursday. Recheck INR in 6 weeks. Spoke with [place] at [facility] and she states if no changes they will follow the encounter sheet given to pt

## 2017-09-02 NOTE — Progress Notes (Signed)
Gabriela Hernandez Date of Birth: Aug 11, 1926   History of Present Illness: Gabriela Hernandez is seen today for followup CHF. She has a history of nonischemic cardiomyopathy with prior echo demonstrated an ejection fraction of 30-35%. Echocardiogram in October 2014 showed significant improvement in her ejection fraction to 55-60%. She has moderate mitral insufficiency. She also has a history of chronic atrial fibrillation and is on coumadin.  She was admitted in February 2018 with E. Coli sepsis/UTI. She was hypotensive and had some rhabdomyolysis and troponin elevation. Also had AKI. This all improved with antibiotics. DC to [facility] for 2 weeks. Now living at [facility] assisted [facility].   She is seen today with her son. She is doing well. She still has some leg swelling but it has improved. She is participating in exercise classes at [facility]. She likes to walk a lot but uses a walker for balance. Notes she is having a physical with lab work next month.     Current Outpatient Medications on File Prior to Visit  Medication Sig Dispense Refill  . carvedilol (COREG) 3.125 MG tablet TAKE (1) TABLET BY MOUTH TWICE DAILY. 60 tablet 6  . docusate sodium (COLACE) 100 MG capsule Take 200 mg by mouth daily.    . furosemide (LASIX) 20 MG tablet Take 20 mg by mouth 2 (two) times daily.    Marland Kitchen levothyroxine (SYNTHROID, LEVOTHROID) 125 MCG tablet Take 1 tablet by mouth daily.    . Multiple Vitamins-Minerals (MULTIVITAMIN WITH MINERALS) tablet Take 1 tablet by mouth daily.    . potassium chloride (K-DUR,KLOR-CON) 10 MEQ tablet Take 10 mEq by mouth daily.    . quinapril (ACCUPRIL) 10 MG tablet TAKE 1/2 TABLET(5MG ) BY MOUTH ONCE A DAY. 15 tablet 9  . warfarin (COUMADIN) 5 MG tablet Take 1 to 1.5 tablets by mouth daily as directed by coumadin clinic 120 tablet 0   No current facility-administered medications on file prior to visit.     Allergies  Allergen Reactions  . Aspirin     Past  Medical History:  Diagnosis Date  . Atrial fibrillation (HCC)   . Chronic systolic CHF (congestive heart failure) (HCC) 04/05/2012  . Dilated cardiomyopathy (HCC)    with ejection fraction of 30-35%  . Dysphagia   . Esophageal stricture   . History of shingles   . Hypertension   . Hypothyroidism   . Lightheadedness     Past Surgical History:  Procedure Laterality Date  . APPENDECTOMY    . CATARACT EXTRACTION    . CESAREAN SECTION    . FOOT SURGERY    . THYROIDECTOMY    . TONSILLECTOMY    . VEIN LIGATION      Social History   Tobacco Use  Smoking Status Never Smoker  Smokeless Tobacco Never Used    Social History   Substance and Sexual Activity  Alcohol Use No    Family History  Problem Relation Age of Onset  . Hypertension Mother   . Heart attack Father     Review of Systems:  All other systems  as noted in history of present illness. All other systems were reviewed and are negative.  Physical Exam: There were no vitals taken for this visit. GENERAL:  Well appearing, elderly WF in NAD HEENT:  PERRL, EOMI, sclera are clear. Oropharynx is clear. NECK:  No jugular venous distention, carotid upstroke brisk and symmetric, no bruits, no thyromegaly or adenopathy LUNGS:  Clear to auscultation bilaterally CHEST:  Unremarkable  HEART:  IRRR,  PMI not displaced or sustained,S1 and S2 within normal limits, no S3, no S4: no clicks, no rubs, 1-2/6 systolicmurmur ABD:  Soft, nontender. BS +, no masses or bruits. No hepatomegaly, no splenomegaly EXT:  2 + pulses throughout, 1+ ankle  edema, no cyanosis no clubbing SKIN:  Warm and dry.  No rashes NEURO:  Alert and oriented x 3. Cranial nerves II through XII intact. PSYCH:  Cognitively intact      LABORATORY DATA: Lab Results  Component Value Date   WBC 6.0 08/14/2016   HGB 11.4 (L) 08/14/2016   HCT 35.8 (L) 08/14/2016   PLT 146 (L) 08/14/2016   GLUCOSE 76 08/14/2016   ALT 15 07/13/2016   AST 23 07/13/2016    NA 139 08/14/2016   K 3.9 08/14/2016   CL 101 08/14/2016   CREATININE 0.61 08/14/2016   BUN 15 08/14/2016   CO2 30 08/14/2016   TSH 1.168 06/27/2016   INR 2.5 08/17/2017  Labs dated 02/15/17: normal CMET. TSH 0.33.   Echo: 05/20/16: Study Conclusions  - Left ventricle: Systolic function was normal. The estimated   ejection fraction was in the range of 55% to 60%. Wall motion was   normal; there were no regional wall motion abnormalities. - Mitral valve: There was mild to moderate regurgitation directed   centrally. - Left atrium: The atrium was massively dilated. - Right atrium: The atrium was mildly dilated. - Atrial septum: The septum bowed from left to right, consistent   with increased left atrial pressure. No defect or patent foramen   ovale was identified. - Pericardium, extracardiac: A small pericardial effusion was   identified posterior to the heart. There was no evidence of   hemodynamic compromise.   Assessment / Plan: 1. Chronic diastolic congestive heart failure - echocardiogram in January 2018 showed good LV function and only mild to moderate MR.  Edema  and weight is stable.  Will continue lasix at current dose of 20 mg bid. Keep feet elevated when possible. I encourage her to continue exercise program.  2. Atrial fibrillation. Rate is well controlled.  Continue anticoagulation with Coumadin. On Coreg for rate control.  3. Hypertension, controlled.  I will follow up in 6 months.

## 2017-09-03 ENCOUNTER — Ambulatory Visit: Payer: Medicare Other | Admitting: Cardiology

## 2017-09-03 ENCOUNTER — Encounter: Payer: Self-pay | Admitting: Cardiology

## 2017-09-03 VITALS — BP 139/75 | HR 80 | Ht 66.0 in | Wt 124.0 lb

## 2017-09-03 DIAGNOSIS — I5032 Chronic diastolic (congestive) heart failure: Secondary | ICD-10-CM | POA: Diagnosis not present

## 2017-09-03 DIAGNOSIS — I482 Chronic atrial fibrillation, unspecified: Secondary | ICD-10-CM

## 2017-09-03 DIAGNOSIS — I10 Essential (primary) hypertension: Secondary | ICD-10-CM

## 2017-09-03 NOTE — Patient Instructions (Signed)
Continue your current therapy  I will see you in 6 months.   

## 2017-09-28 ENCOUNTER — Ambulatory Visit: Payer: Medicare Other | Admitting: *Deleted

## 2017-09-28 DIAGNOSIS — Z5181 Encounter for therapeutic drug level monitoring: Secondary | ICD-10-CM

## 2017-09-28 DIAGNOSIS — I4891 Unspecified atrial fibrillation: Secondary | ICD-10-CM

## 2017-09-28 LAB — POCT INR: INR: 3.9

## 2017-09-28 NOTE — Patient Instructions (Addendum)
Description   Do not take coumadin today May 14th then continue taking 1 tablet (5mg ) daily except 1.5 tablets (7.5mg ) every Thursday. Recheck INR in 2 weeks. Spoke with Andrey Campanile and [place]  at [facility] and dosing instructions given to [facility] as well as written order  on [facility] Sheet

## 2017-10-14 ENCOUNTER — Ambulatory Visit: Payer: Medicare Other | Admitting: *Deleted

## 2017-10-14 DIAGNOSIS — Z5181 Encounter for therapeutic drug level monitoring: Secondary | ICD-10-CM

## 2017-10-14 DIAGNOSIS — I4891 Unspecified atrial fibrillation: Secondary | ICD-10-CM | POA: Diagnosis not present

## 2017-10-14 LAB — POCT INR: INR: 2.8 (ref 2.0–3.0)

## 2017-10-14 NOTE — Patient Instructions (Signed)
Continue taking 1 tablet (5mg ) daily except 1.5 tablets (7.5mg ) every Thursday. Recheck INR in 3 weeks

## 2017-11-05 ENCOUNTER — Ambulatory Visit: Payer: Medicare Other | Admitting: *Deleted

## 2017-11-05 DIAGNOSIS — Z5181 Encounter for therapeutic drug level monitoring: Secondary | ICD-10-CM | POA: Diagnosis not present

## 2017-11-05 DIAGNOSIS — I4891 Unspecified atrial fibrillation: Secondary | ICD-10-CM

## 2017-11-05 LAB — POCT INR: INR: 2.9 (ref 2.0–3.0)

## 2017-11-05 NOTE — Patient Instructions (Signed)
Description   Continue taking 1 tablet (5mg ) daily except 1.5 tablets (7.5mg ) every Thursday. Recheck INR in 4 weeks.

## 2017-11-30 ENCOUNTER — Ambulatory Visit: Payer: Medicare Other | Admitting: *Deleted

## 2017-11-30 DIAGNOSIS — I4891 Unspecified atrial fibrillation: Secondary | ICD-10-CM | POA: Diagnosis not present

## 2017-11-30 DIAGNOSIS — Z5181 Encounter for therapeutic drug level monitoring: Secondary | ICD-10-CM

## 2017-11-30 LAB — POCT INR: INR: 2.5 (ref 2.0–3.0)

## 2017-11-30 NOTE — Patient Instructions (Signed)
Description   Continue taking 1 tablet (5mg ) daily except 1.5 tablets (7.5mg ) every Thursday. Recheck INR in 6 weeks.

## 2018-01-12 ENCOUNTER — Ambulatory Visit (INDEPENDENT_AMBULATORY_CARE_PROVIDER_SITE_OTHER): Payer: Medicare Other | Admitting: *Deleted

## 2018-01-12 DIAGNOSIS — I4891 Unspecified atrial fibrillation: Secondary | ICD-10-CM

## 2018-01-12 DIAGNOSIS — Z5181 Encounter for therapeutic drug level monitoring: Secondary | ICD-10-CM

## 2018-01-12 LAB — POCT INR: INR: 2.2 (ref 2.0–3.0)

## 2018-01-12 NOTE — Patient Instructions (Signed)
Description   Continue taking 1 tablet (5mg) daily except 1.5 tablets (7.5mg) every Thursday. Recheck INR in 6 weeks.     

## 2018-02-18 ENCOUNTER — Ambulatory Visit: Payer: Medicare Other

## 2018-02-18 DIAGNOSIS — Z5181 Encounter for therapeutic drug level monitoring: Secondary | ICD-10-CM | POA: Diagnosis not present

## 2018-02-18 DIAGNOSIS — I4891 Unspecified atrial fibrillation: Secondary | ICD-10-CM

## 2018-02-18 LAB — POCT INR: INR: 3.1 — AB (ref 2.0–3.0)

## 2018-02-18 NOTE — Patient Instructions (Signed)
Description   Skip today's dosage of Coumadin, then resume same dosage 1 tablet (5mg ) daily except 1.5 tablets (7.5mg ) every Thursday. Recheck INR in 4 weeks.

## 2018-03-11 ENCOUNTER — Ambulatory Visit: Payer: Medicare Other | Admitting: Cardiology

## 2018-03-12 NOTE — Progress Notes (Signed)
Gabriela Hernandez Date of Birth: 17-Feb-1927   History of Present Illness: Gabriela Hernandez is seen today for followup CHF. She has a history of nonischemic cardiomyopathy with prior echo demonstrated an ejection fraction of 30-35%. Echocardiogram in October 2014 showed significant improvement in her ejection fraction to 55-60%. She has moderate mitral insufficiency. She also has a history of  atrial fibrillation and is on coumadin.  She was admitted in February 2018 with E. Coli sepsis/UTI. She was hypotensive and had some rhabdomyolysis and troponin elevation. Also had AKI. This all improved with antibiotics. DC to [facility] for 2 weeks. Now living at [facility] assisted [facility].   She is seen today with her son. She reports a tremor or shaking. She still has some leg swelling but it is stable. Weight is down 5 lbs. She states she is not eating as much because she has trouble swallowing solid food.   She likes to walk and uses a walker for balance. Notes she tires more easily. Not aware of palpitations or dizziness.     Current Outpatient Medications on File Prior to Visit  Medication Sig Dispense Refill  . carvedilol (COREG) 3.125 MG tablet TAKE (1) TABLET BY MOUTH TWICE DAILY. 60 tablet 6  . docusate sodium (COLACE) 100 MG capsule Take 200 mg by mouth daily.    . furosemide (LASIX) 20 MG tablet Take 20 mg by mouth 2 (two) times daily.    Marland Kitchen levothyroxine (SYNTHROID, LEVOTHROID) 125 MCG tablet Take 1 tablet by mouth daily.    . Multiple Vitamins-Minerals (MULTIVITAMIN WITH MINERALS) tablet Take 1 tablet by mouth daily.    . potassium chloride (K-DUR,KLOR-CON) 10 MEQ tablet Take 10 mEq by mouth daily.    . quinapril (ACCUPRIL) 10 MG tablet TAKE 1/2 TABLET(5MG ) BY MOUTH ONCE A DAY. 15 tablet 9  . warfarin (COUMADIN) 5 MG tablet Take 1 to 1.5 tablets by mouth daily as directed by coumadin clinic 120 tablet 0   No current facility-administered medications on file prior to visit.      Allergies  Allergen Reactions  . Aspirin     Past Medical History:  Diagnosis Date  . Atrial fibrillation (HCC)   . Chronic systolic CHF (congestive heart failure) (HCC) 04/05/2012  . Dilated cardiomyopathy (HCC)    with ejection fraction of 30-35%  . Dysphagia   . Esophageal stricture   . History of shingles   . Hypertension   . Hypothyroidism   . Lightheadedness     Past Surgical History:  Procedure Laterality Date  . APPENDECTOMY    . CATARACT EXTRACTION    . CESAREAN SECTION    . FOOT SURGERY    . THYROIDECTOMY    . TONSILLECTOMY    . VEIN LIGATION      Social History   Tobacco Use  Smoking Status Never Smoker  Smokeless Tobacco Never Used    Social History   Substance and Sexual Activity  Alcohol Use No    Family History  Problem Relation Age of Onset  . Hypertension Mother   . Heart attack Father     Review of Systems:  All other systems  as noted in history of present illness. All other systems were reviewed and are negative.  Physical Exam: There were no vitals taken for this visit. GENERAL:  Chronically ill  appearing, elderly WF in NAD HEENT:  PERRL, EOMI, sclera are clear. Oropharynx is clear. NECK:  No jugular venous distention, carotid upstroke brisk and symmetric,  no bruits, no thyromegaly or adenopathy LUNGS:  Clear to auscultation bilaterally CHEST:  Unremarkable HEART:  IRRR,  PMI not displaced or sustained,S1 and S2 within normal limits, no S3, no S4: no clicks, no rubs, 1-2/6 systolicmurmur ABD:  Soft, nontender. BS +, no masses or bruits. No hepatomegaly, no splenomegaly EXT:  2 + pulses throughout, 1+ ankle  Edema L>R, no cyanosis no clubbing SKIN:  Warm and dry.  No rashes NEURO:  Alert and oriented x 3. Cranial nerves II through XII intact. PSYCH:  Cognitively intact      LABORATORY DATA: Lab Results  Component Value Date   WBC 6.0 08/14/2016   HGB 11.4 (L) 08/14/2016   HCT 35.8 (L) 08/14/2016   PLT 146 (L)  08/14/2016   GLUCOSE 76 08/14/2016   ALT 15 07/13/2016   AST 23 07/13/2016   NA 139 08/14/2016   K 3.9 08/14/2016   CL 101 08/14/2016   CREATININE 0.61 08/14/2016   BUN 15 08/14/2016   CO2 30 08/14/2016   TSH 1.168 06/27/2016   INR 3.1 (A) 02/18/2018  Labs dated 02/15/17: normal CMET. TSH 0.33.  Dated 11/15/17: normal BMET. Dated 01/27/18: normal TSH.  Echo: 05/20/16: Study Conclusions  - Left ventricle: Systolic function was normal. The estimated   ejection fraction was in the range of 55% to 60%. Wall motion was   normal; there were no regional wall motion abnormalities. - Mitral valve: There was mild to moderate regurgitation directed   centrally. - Left atrium: The atrium was massively dilated. - Right atrium: The atrium was mildly dilated. - Atrial septum: The septum bowed from left to right, consistent   with increased left atrial pressure. No defect or patent foramen   ovale was identified. - Pericardium, extracardiac: A small pericardial effusion was   identified posterior to the heart. There was no evidence of   hemodynamic compromise.  Ecg today shows Afib with rate 59 bpm. NSIVCD- new since last year. I have personally reviewed and interpreted this study.   Assessment / Plan: 1. Chronic diastolic congestive heart failure - echocardiogram in January 2018 showed good LV function and only mild to moderate MR.  Edema  and weight is down.  Will continue lasix at current dose of 20 mg bid. Keep feet elevated when possible.  2. Atrial fibrillation. Rate is well controlled.  Continue anticoagulation with Coumadin. INR high today at 4.3. Dose adjusted by pharmacy. On Coreg for rate control.  3. Hypertension, controlled.  4. Weight loss/difficulty swallowing. May need to eat more of a soft diet. Consider supplement with Boost or Ensure.   I will follow up in 6 months.

## 2018-03-22 ENCOUNTER — Ambulatory Visit: Payer: Medicare Other | Admitting: Cardiology

## 2018-03-22 ENCOUNTER — Ambulatory Visit (INDEPENDENT_AMBULATORY_CARE_PROVIDER_SITE_OTHER): Payer: Medicare Other | Admitting: Pharmacist Clinician (PhC)/ Clinical Pharmacy Specialist

## 2018-03-22 ENCOUNTER — Encounter: Payer: Self-pay | Admitting: Cardiology

## 2018-03-22 VITALS — BP 102/70 | HR 59 | Ht 66.0 in | Wt 119.8 lb

## 2018-03-22 DIAGNOSIS — Z7901 Long term (current) use of anticoagulants: Secondary | ICD-10-CM

## 2018-03-22 DIAGNOSIS — Z5181 Encounter for therapeutic drug level monitoring: Secondary | ICD-10-CM | POA: Diagnosis not present

## 2018-03-22 DIAGNOSIS — I5032 Chronic diastolic (congestive) heart failure: Secondary | ICD-10-CM

## 2018-03-22 DIAGNOSIS — I480 Paroxysmal atrial fibrillation: Secondary | ICD-10-CM | POA: Diagnosis not present

## 2018-03-22 DIAGNOSIS — I4891 Unspecified atrial fibrillation: Secondary | ICD-10-CM | POA: Diagnosis not present

## 2018-03-22 DIAGNOSIS — I10 Essential (primary) hypertension: Secondary | ICD-10-CM

## 2018-03-22 LAB — POCT INR: INR: 4.3 — AB (ref 2.0–3.0)

## 2018-03-22 NOTE — Patient Instructions (Signed)
Description   No warfarin Tuesday Nov 5 or Wednesday Nov 6, then decrease dose to 1 tablet (5 mg) daily.  Repeat INR in 2 weeks.

## 2018-03-22 NOTE — Patient Instructions (Signed)
Continue your current therapy  Follow up in 6 months   

## 2018-04-05 ENCOUNTER — Ambulatory Visit: Payer: Medicare Other | Admitting: *Deleted

## 2018-04-05 DIAGNOSIS — Z5181 Encounter for therapeutic drug level monitoring: Secondary | ICD-10-CM | POA: Diagnosis not present

## 2018-04-05 DIAGNOSIS — I4891 Unspecified atrial fibrillation: Secondary | ICD-10-CM

## 2018-04-05 LAB — POCT INR: INR: 2.9 (ref 2.0–3.0)

## 2018-04-05 NOTE — Patient Instructions (Signed)
Description   Continue taking 1 tablet (5 mg) daily.  Repeat INR in 3 weeks. [facility] [phone] Main [phone]

## 2018-04-29 ENCOUNTER — Ambulatory Visit (INDEPENDENT_AMBULATORY_CARE_PROVIDER_SITE_OTHER): Payer: Medicare Other

## 2018-04-29 ENCOUNTER — Encounter (INDEPENDENT_AMBULATORY_CARE_PROVIDER_SITE_OTHER): Payer: Self-pay

## 2018-04-29 DIAGNOSIS — Z5181 Encounter for therapeutic drug level monitoring: Secondary | ICD-10-CM

## 2018-04-29 DIAGNOSIS — I4891 Unspecified atrial fibrillation: Secondary | ICD-10-CM | POA: Diagnosis not present

## 2018-04-29 LAB — POCT INR: INR: 2.1 (ref 2.0–3.0)

## 2018-04-29 NOTE — Patient Instructions (Signed)
Description   Continue taking 1 tablet (5 mg) daily.  Repeat INR in 4 weeks. [facility] Clinic [phone] Main [phone]

## 2018-06-03 ENCOUNTER — Ambulatory Visit: Payer: Medicare Other | Admitting: Pharmacist

## 2018-06-03 DIAGNOSIS — Z5181 Encounter for therapeutic drug level monitoring: Secondary | ICD-10-CM

## 2018-06-03 DIAGNOSIS — I4891 Unspecified atrial fibrillation: Secondary | ICD-10-CM | POA: Diagnosis not present

## 2018-06-03 LAB — POCT INR: INR: 3.5 — AB (ref 2.0–3.0)

## 2018-06-03 NOTE — Patient Instructions (Signed)
Description   No warfarin today then continue taking 1 tablet (5 mg) daily.  Repeat INR in 3 weeks. [facility] Clinic [phone] Main [phone]

## 2018-06-29 ENCOUNTER — Ambulatory Visit: Payer: Medicare Other | Admitting: Pharmacist

## 2018-06-29 DIAGNOSIS — I4891 Unspecified atrial fibrillation: Secondary | ICD-10-CM

## 2018-06-29 DIAGNOSIS — Z5181 Encounter for therapeutic drug level monitoring: Secondary | ICD-10-CM

## 2018-06-29 LAB — POCT INR: INR: 4.2 — AB (ref 2.0–3.0)

## 2018-06-29 NOTE — Patient Instructions (Signed)
No warfarin today or tomorrow then start taking 1 tablet (5 mg) daily except 1/2 tablet on sunday.  Repeat INR in 2 weeks. [facility] [phone] Main [phone]

## 2018-07-14 ENCOUNTER — Other Ambulatory Visit: Payer: Self-pay | Admitting: Physician Assistant

## 2018-07-14 DIAGNOSIS — R1310 Dysphagia, unspecified: Secondary | ICD-10-CM

## 2018-07-19 ENCOUNTER — Telehealth: Payer: Self-pay | Admitting: Internal Medicine

## 2018-07-19 MED ORDER — WARFARIN SODIUM 5 MG PO TABS: ORAL_TABLET | ORAL | 0 refills | Status: AC

## 2018-07-19 NOTE — Telephone Encounter (Signed)
°*  STAT* If patient is at the pharmacy, call can be transferred to refill team.   1. Which medications need to be refilled? (please list name of each medication and dose if known)  Warfarin  2. Which pharmacy/location (including street and city if local pharmacy) is medication to be sent to? [facility] [phone]  3. Do they need a 30 day or 90 day supply? 22 and refills

## 2018-07-21 ENCOUNTER — Other Ambulatory Visit: Payer: Self-pay | Admitting: Physician Assistant

## 2018-07-21 ENCOUNTER — Ambulatory Visit
Admission: RE | Admit: 2018-07-21 | Discharge: 2018-07-21 | Disposition: A | Discharge status: Home / Self Care | Payer: Medicare Other | Source: Ambulatory Visit | Attending: Physician Assistant | Admitting: Physician Assistant

## 2018-07-21 DIAGNOSIS — R1310 Dysphagia, unspecified: Secondary | ICD-10-CM

## 2018-07-22 ENCOUNTER — Ambulatory Visit: Payer: Medicare Other | Admitting: Pharmacist

## 2018-07-22 DIAGNOSIS — I4891 Unspecified atrial fibrillation: Secondary | ICD-10-CM | POA: Diagnosis not present

## 2018-07-22 DIAGNOSIS — Z5181 Encounter for therapeutic drug level monitoring: Secondary | ICD-10-CM | POA: Diagnosis not present

## 2018-07-22 LAB — POCT INR: INR: 4.8 — AB (ref 2.0–3.0)

## 2018-07-22 NOTE — Patient Instructions (Signed)
Description   No warfarin today or tomorrow then start taking 1 tablet (5 mg) daily except 1/2 tablet on Sundays, Tuesdays and Thursdays.  Repeat INR in 10 days. [facility] [phone] Main [phone]

## 2018-07-28 ENCOUNTER — Other Ambulatory Visit: Payer: Self-pay | Admitting: Gastroenterology

## 2018-07-28 DIAGNOSIS — R1310 Dysphagia, unspecified: Secondary | ICD-10-CM

## 2018-07-29 ENCOUNTER — Ambulatory Visit
Admission: RE | Admit: 2018-07-29 | Discharge: 2018-07-29 | Disposition: A | Discharge status: Home / Self Care | Payer: Medicare Other | Source: Ambulatory Visit | Attending: Gastroenterology | Admitting: Gastroenterology

## 2018-07-29 DIAGNOSIS — R1310 Dysphagia, unspecified: Secondary | ICD-10-CM

## 2018-08-03 ENCOUNTER — Ambulatory Visit: Payer: Medicare Other | Admitting: Pharmacist

## 2018-08-03 ENCOUNTER — Other Ambulatory Visit: Payer: Self-pay

## 2018-08-03 DIAGNOSIS — I4891 Unspecified atrial fibrillation: Secondary | ICD-10-CM | POA: Diagnosis not present

## 2018-08-03 DIAGNOSIS — Z5181 Encounter for therapeutic drug level monitoring: Secondary | ICD-10-CM | POA: Diagnosis not present

## 2018-08-03 LAB — POCT INR: INR: 2.4 (ref 2.0–3.0)

## 2018-08-03 NOTE — Patient Instructions (Signed)
Description   Continue taking 1 tablet (5 mg) daily except 1/2 tablet on Sundays, Tuesdays and Thursdays.  Repeat INR in 3 weeks. [facility] [phone] Main [phone]. Discuss switching to a DOAC at your next appointment with Dr. Swaziland.

## 2018-08-23 ENCOUNTER — Telehealth: Payer: Self-pay

## 2018-08-23 NOTE — Telephone Encounter (Signed)

## 2018-08-24 ENCOUNTER — Other Ambulatory Visit: Payer: Self-pay

## 2018-08-24 ENCOUNTER — Ambulatory Visit (INDEPENDENT_AMBULATORY_CARE_PROVIDER_SITE_OTHER): Payer: Medicare Other

## 2018-08-24 DIAGNOSIS — Z5181 Encounter for therapeutic drug level monitoring: Secondary | ICD-10-CM

## 2018-08-24 DIAGNOSIS — I4891 Unspecified atrial fibrillation: Secondary | ICD-10-CM | POA: Diagnosis not present

## 2018-08-24 LAB — POCT INR: INR: 2.4 (ref 2.0–3.0)

## 2018-09-19 ENCOUNTER — Telehealth: Payer: Self-pay

## 2018-09-19 NOTE — Telephone Encounter (Signed)
lmom for prescreen  

## 2018-09-19 NOTE — Telephone Encounter (Signed)

## 2018-09-21 ENCOUNTER — Ambulatory Visit (INDEPENDENT_AMBULATORY_CARE_PROVIDER_SITE_OTHER): Payer: Medicare Other

## 2018-09-21 ENCOUNTER — Other Ambulatory Visit: Payer: Self-pay

## 2018-09-21 DIAGNOSIS — Z5181 Encounter for therapeutic drug level monitoring: Secondary | ICD-10-CM

## 2018-09-21 DIAGNOSIS — I480 Paroxysmal atrial fibrillation: Secondary | ICD-10-CM

## 2018-09-21 DIAGNOSIS — I4891 Unspecified atrial fibrillation: Secondary | ICD-10-CM

## 2018-09-21 LAB — POCT INR: INR: 1.6 — AB (ref 2.0–3.0)

## 2018-09-21 NOTE — Patient Instructions (Addendum)
Description   Called spoke with pt's son, advised to have pt take an extra 1/2 tablet today and tomorrow (1.5 tablets today and 1 tablet tomorrow), then start taking 1 tablet (5 mg) daily except 1/2 tablet on Sundays and Thursdays.  Repeat INR in 2 weeks. [facility]in [facility] [phone] Main [phone]. Discuss switching to a DOAC at your next appointment with Dr. Swaziland on 10/03/18. Made INR check on same date at Dr Swaziland appt. MD signed orders to be faxed to [facility].

## 2018-09-22 ENCOUNTER — Telehealth: Payer: Self-pay | Admitting: Cardiology

## 2018-09-22 NOTE — Telephone Encounter (Signed)
LVM regarding video or phone visit. °

## 2018-09-27 ENCOUNTER — Telehealth: Payer: Self-pay | Admitting: *Deleted

## 2018-09-27 NOTE — Telephone Encounter (Signed)
I spoke with Gabriela Hernandez son,re: follow appointment with the PA, he want to wait for Dr.Jordan.

## 2018-10-03 ENCOUNTER — Telehealth: Payer: Medicare Other | Admitting: Cardiology

## 2018-10-07 ENCOUNTER — Telehealth: Payer: Self-pay

## 2018-10-07 NOTE — Telephone Encounter (Signed)

## 2018-10-11 ENCOUNTER — Telehealth: Payer: Self-pay | Admitting: Pharmacist

## 2018-10-11 ENCOUNTER — Ambulatory Visit (INDEPENDENT_AMBULATORY_CARE_PROVIDER_SITE_OTHER): Payer: Medicare Other | Admitting: Pharmacist

## 2018-10-11 ENCOUNTER — Other Ambulatory Visit: Payer: Self-pay

## 2018-10-11 DIAGNOSIS — I480 Paroxysmal atrial fibrillation: Secondary | ICD-10-CM

## 2018-10-11 DIAGNOSIS — I4891 Unspecified atrial fibrillation: Secondary | ICD-10-CM | POA: Diagnosis not present

## 2018-10-11 DIAGNOSIS — Z5181 Encounter for therapeutic drug level monitoring: Secondary | ICD-10-CM

## 2018-10-11 LAB — POCT INR: INR: 3.2 — AB (ref 2.0–3.0)

## 2018-10-11 NOTE — Telephone Encounter (Signed)
New Message    Arline Asp is calling from [facility] and is needing the coumadin notes and results sent to them

## 2018-10-11 NOTE — Patient Instructions (Signed)
Spoke with son and instructed that patient take half tablet today then continue taking 1 tablet daily except half a tablet on Sunday and Thursday.  Repeat INR in 3 weeks. [facility]in [facility] [phone] Main [phone]. MD signed orders to be faxed to [facility].

## 2018-10-11 NOTE — Telephone Encounter (Signed)
Original fax failed. refaxing now

## 2018-10-27 ENCOUNTER — Telehealth: Payer: Self-pay

## 2018-10-27 NOTE — Telephone Encounter (Signed)

## 2018-11-03 ENCOUNTER — Other Ambulatory Visit: Payer: Self-pay

## 2018-11-03 ENCOUNTER — Ambulatory Visit: Payer: Medicare Other

## 2018-11-03 DIAGNOSIS — I4891 Unspecified atrial fibrillation: Secondary | ICD-10-CM | POA: Diagnosis not present

## 2018-11-03 DIAGNOSIS — Z5181 Encounter for therapeutic drug level monitoring: Secondary | ICD-10-CM | POA: Diagnosis not present

## 2018-11-03 LAB — POCT INR: INR: 3.7 — AB (ref 2.0–3.0)

## 2018-11-03 NOTE — Patient Instructions (Signed)
Description   Skip today's dosage of Coumadin, then start taking 1 tablet daily except 1/2 tablet on Sundays, Tuesdays and Thursdays.  Repeat INR in 3 weeks. [facility] Clinic [phone] Main [phone]. MD signed orders to be faxed to [facility].

## 2018-11-22 ENCOUNTER — Telehealth: Payer: Self-pay

## 2018-11-22 NOTE — Telephone Encounter (Signed)

## 2018-11-29 ENCOUNTER — Other Ambulatory Visit: Payer: Self-pay

## 2018-11-29 ENCOUNTER — Ambulatory Visit (INDEPENDENT_AMBULATORY_CARE_PROVIDER_SITE_OTHER): Payer: Medicare Other | Admitting: *Deleted

## 2018-11-29 DIAGNOSIS — I4891 Unspecified atrial fibrillation: Secondary | ICD-10-CM | POA: Diagnosis not present

## 2018-11-29 DIAGNOSIS — Z5181 Encounter for therapeutic drug level monitoring: Secondary | ICD-10-CM

## 2018-11-29 LAB — POCT INR: INR: 1.2 — AB (ref 2.0–3.0)

## 2018-11-29 NOTE — Patient Instructions (Addendum)
Description   Take 1 tablet (5mg ) today and 1.5 tablets (7.5mg ) tomorrow,  then start taking 1 tablet  (5 mg) daily except 1/2 tablet (2.5 mg) on Sundays and Thursdays.  Repeat INR in 1 week. [facility] [phone] Main [phone]. MD signed orders and were faxed to [facility]. Fax #  [phone])

## 2018-12-05 ENCOUNTER — Telehealth: Payer: Self-pay

## 2018-12-05 NOTE — Telephone Encounter (Signed)
lmom for prescreen  

## 2018-12-05 NOTE — Telephone Encounter (Signed)

## 2018-12-06 ENCOUNTER — Other Ambulatory Visit: Payer: Self-pay

## 2018-12-06 ENCOUNTER — Ambulatory Visit (INDEPENDENT_AMBULATORY_CARE_PROVIDER_SITE_OTHER): Payer: Medicare Other | Admitting: *Deleted

## 2018-12-06 DIAGNOSIS — Z5181 Encounter for therapeutic drug level monitoring: Secondary | ICD-10-CM | POA: Diagnosis not present

## 2018-12-06 DIAGNOSIS — I4891 Unspecified atrial fibrillation: Secondary | ICD-10-CM

## 2018-12-06 LAB — POCT INR: INR: 1.8 — AB (ref 2.0–3.0)

## 2018-12-06 NOTE — Patient Instructions (Signed)
Description   Take 1.5 tablets (7.5mg ) today,  then start taking 1 tablet  (5 mg) daily except 1/2 tablet (2.5 mg) on Sundays.  Repeat INR in 2 weeks. [facility]in Clinic [phone] Main [phone]. MD signed orders and were faxed to [facility]. Fax # [phone])

## 2018-12-20 ENCOUNTER — Ambulatory Visit (INDEPENDENT_AMBULATORY_CARE_PROVIDER_SITE_OTHER): Payer: Medicare Other | Admitting: *Deleted

## 2018-12-20 ENCOUNTER — Other Ambulatory Visit: Payer: Self-pay

## 2018-12-20 DIAGNOSIS — Z5181 Encounter for therapeutic drug level monitoring: Secondary | ICD-10-CM | POA: Diagnosis not present

## 2018-12-20 DIAGNOSIS — I4891 Unspecified atrial fibrillation: Secondary | ICD-10-CM | POA: Diagnosis not present

## 2018-12-20 LAB — POCT INR: INR: 1.7 — AB (ref 2.0–3.0)

## 2018-12-20 NOTE — Patient Instructions (Signed)
Description   Take 1.5 tablets (7.5mg ) today,  then start taking 1 tablet  (5 mg) daily.  Repeat INR in 2 weeks. [facility] [phone] Main [phone]. MD signed orders and were faxed to [facility]. Fax # [phone])

## 2019-01-03 ENCOUNTER — Ambulatory Visit (INDEPENDENT_AMBULATORY_CARE_PROVIDER_SITE_OTHER): Payer: Medicare Other | Admitting: *Deleted

## 2019-01-03 ENCOUNTER — Other Ambulatory Visit: Payer: Self-pay

## 2019-01-03 DIAGNOSIS — I4891 Unspecified atrial fibrillation: Secondary | ICD-10-CM

## 2019-01-03 DIAGNOSIS — Z5181 Encounter for therapeutic drug level monitoring: Secondary | ICD-10-CM

## 2019-01-03 LAB — POCT INR: INR: 2.4 (ref 2.0–3.0)

## 2019-01-03 NOTE — Patient Instructions (Signed)
Description   Continue taking 1 tablet  (5 mg) daily.  Repeat INR in 3 weeks. [facility] [phone] Main [phone]. MD signed orders and were faxed to [facility]. Fax # [phone])

## 2019-01-24 ENCOUNTER — Other Ambulatory Visit: Payer: Self-pay

## 2019-01-24 ENCOUNTER — Ambulatory Visit (INDEPENDENT_AMBULATORY_CARE_PROVIDER_SITE_OTHER): Payer: Medicare Other | Admitting: *Deleted

## 2019-01-24 DIAGNOSIS — I4891 Unspecified atrial fibrillation: Secondary | ICD-10-CM

## 2019-01-24 DIAGNOSIS — Z5181 Encounter for therapeutic drug level monitoring: Secondary | ICD-10-CM | POA: Diagnosis not present

## 2019-01-24 DIAGNOSIS — I480 Paroxysmal atrial fibrillation: Secondary | ICD-10-CM

## 2019-01-24 LAB — POCT INR: INR: 3.8 — AB (ref 2.0–3.0)

## 2019-01-24 NOTE — Patient Instructions (Signed)
Description    Hold today, then continue taking 1 tablet  (5 mg) daily.  Repeat INR in 2 weeks. [facility] [phone] Main [phone]. MD signed orders and were faxed to [facility]. Fax # [phone])

## 2019-02-08 ENCOUNTER — Ambulatory Visit (INDEPENDENT_AMBULATORY_CARE_PROVIDER_SITE_OTHER): Payer: Medicare Other | Admitting: *Deleted

## 2019-02-08 ENCOUNTER — Other Ambulatory Visit: Payer: Self-pay

## 2019-02-08 DIAGNOSIS — I4891 Unspecified atrial fibrillation: Secondary | ICD-10-CM

## 2019-02-08 DIAGNOSIS — Z5181 Encounter for therapeutic drug level monitoring: Secondary | ICD-10-CM

## 2019-02-08 LAB — POCT INR: INR: 3.8 — AB (ref 2.0–3.0)

## 2019-02-08 NOTE — Patient Instructions (Signed)
Description   Do not take any Coumadin today then start taking 1 tablet (5 mg) daily except 1/2 tablet on Sundays.  Repeat INR in 2 weeks. [facility] [phone] Main [phone]. MD signed orders and were faxed to [facility]. Fax # [phone])

## 2019-02-21 ENCOUNTER — Ambulatory Visit (INDEPENDENT_AMBULATORY_CARE_PROVIDER_SITE_OTHER): Payer: Medicare Other | Admitting: *Deleted

## 2019-02-21 ENCOUNTER — Other Ambulatory Visit: Payer: Self-pay

## 2019-02-21 DIAGNOSIS — Z5181 Encounter for therapeutic drug level monitoring: Secondary | ICD-10-CM | POA: Diagnosis not present

## 2019-02-21 DIAGNOSIS — I4891 Unspecified atrial fibrillation: Secondary | ICD-10-CM

## 2019-02-21 LAB — POCT INR: INR: 4.7 — AB (ref 2.0–3.0)

## 2019-02-21 NOTE — Patient Instructions (Signed)
Description   Do not take any Coumadin today or tomorrow then start taking 1 tablet (5 mg) daily except 1/2 tablet on Sundays and Thursdays.  Repeat INR in 2 weeks. [facility] [phone] Main [phone]. MD signed orders and were faxed to [facility]. Fax # [phone])

## 2019-03-01 NOTE — Progress Notes (Signed)
Gabriela Hernandez Date of Birth: Aug 26, 1926   History of Present Illness: Gabriela Hernandez is seen today for followup CHF. She has a history of nonischemic cardiomyopathy with prior echo demonstrated an ejection fraction of 30-35%. Echocardiogram in October 2014 showed significant improvement in her ejection fraction to 55-60%. She has moderate mitral insufficiency. She also has a history of  atrial fibrillation and is on coumadin.  She was admitted in February 2018 with E. Coli sepsis/UTI. She was hypotensive and had some rhabdomyolysis and troponin elevation. Also had AKI. This all improved with antibiotics. Echo showed normal LV function.  Now living at [facility] assisted living.   She is seen today with her son. She has continued to lose weight. She has difficulty swallowing.  She still has some leg swelling but it is stable. Weight is down 17  Lbs in the last year. She is inactive. Has to use a walker at all times. Very weak. Still notes SOB.  Tires easily.     Current Outpatient Medications on File Prior to Visit  Medication Sig Dispense Refill  . carvedilol (COREG) 3.125 MG tablet TAKE (1) TABLET BY MOUTH TWICE DAILY. 60 tablet 6  . docusate sodium (COLACE) 100 MG capsule Take 200 mg by mouth as needed.     . furosemide (LASIX) 20 MG tablet Take 20 mg by mouth daily.    Marland Kitchen levothyroxine (SYNTHROID, LEVOTHROID) 125 MCG tablet Take 1 tablet by mouth daily.    . Multiple Vitamins-Minerals (MULTIVITAMIN WITH MINERALS) tablet Take 1 tablet by mouth daily.    . potassium chloride (K-DUR,KLOR-CON) 10 MEQ tablet Take 10 mEq by mouth daily.    . quinapril (ACCUPRIL) 10 MG tablet TAKE 1/2 TABLET(5MG ) BY MOUTH ONCE A DAY. 15 tablet 9  . warfarin (COUMADIN) 5 MG tablet Take 1 to 1.5 tablets by mouth daily as directed by coumadin clinic 120 tablet 0   No current facility-administered medications on file prior to visit.     Allergies  Allergen Reactions  . Aspirin     Past Medical History:   Diagnosis Date  . Atrial fibrillation ([place])   . Chronic systolic CHF (congestive heart failure) ([place]) 04/05/2012  . Dilated cardiomyopathy ([place])    with ejection fraction of 30-35%  . Dysphagia   . Esophageal stricture   . History of shingles   . Hypertension   . Hypothyroidism   . Lightheadedness     Past Surgical History:  Procedure Laterality Date  . APPENDECTOMY    . CATARACT EXTRACTION    . CESAREAN SECTION    . FOOT SURGERY    . THYROIDECTOMY    . TONSILLECTOMY    . VEIN LIGATION      Social History   Tobacco Use  Smoking Status Never Smoker  Smokeless Tobacco Never Used    Social History   Substance and Sexual Activity  Alcohol Use No    Family History  Problem Relation Age of Onset  . Hypertension Mother   . Heart attack Father     Review of Systems:  All other systems  as noted in history of present illness. All other systems were reviewed and are negative.  Physical Exam: BP 138/86   Pulse 64   Ht 5\' 6"  (1.676 m)   Wt 102 lb 9.6 oz (46.5 kg)   SpO2 99%   BMI 16.56 kg/m  GENERAL:  Chronically ill  appearing, thin, elderly WF in NAD HEENT:  PERRL, EOMI, sclera are clear. Oropharynx  is clear. NECK:  No jugular venous distention, carotid upstroke brisk and symmetric, no bruits, no thyromegaly or adenopathy LUNGS:  Clear to auscultation bilaterally CHEST:  Unremarkable HEART:  IRRR,  PMI not displaced or sustained,S1 and S2 within normal limits, no S3, no S4: no clicks, no rubs, 1-2/6 systolicmurmur ABD:  Soft, nontender. BS +, no masses or bruits. No hepatomegaly, no splenomegaly EXT:  2 + pulses throughout, 1+ ankle  Edema L>R,  SKIN:  Warm and dry.  No rashes NEURO:  Alert and oriented x 3. Cranial nerves II through XII intact. PSYCH:  Cognitively intact      LABORATORY DATA: Lab Results  Component Value Date   WBC 6.0 08/14/2016   HGB 11.4 (L) 08/14/2016   HCT 35.8 (L) 08/14/2016   PLT 146 (L) 08/14/2016   GLUCOSE 76 08/14/2016    ALT 15 07/13/2016   AST 23 07/13/2016   NA 139 08/14/2016   K 3.9 08/14/2016   CL 101 08/14/2016   CREATININE 0.61 08/14/2016   BUN 15 08/14/2016   CO2 30 08/14/2016   TSH 1.168 06/27/2016   INR 4.7 (A) 02/21/2019  Labs dated 02/15/17: normal CMET. TSH 0.33.  Dated 11/15/17: normal BMET. Dated 01/27/18: normal TSH. Dated 06/14/18: BMET and TSH normal  Echo: 05/20/16: Study Conclusions  - Left ventricle: Systolic function was normal. The estimated   ejection fraction was in the range of 55% to 60%. Wall motion was   normal; there were no regional wall motion abnormalities. - Mitral valve: There was mild to moderate regurgitation directed   centrally. - Left atrium: The atrium was massively dilated. - Right atrium: The atrium was mildly dilated. - Atrial septum: The septum bowed from left to right, consistent   with increased left atrial pressure. No defect or patent foramen   ovale was identified. - Pericardium, extracardiac: A small pericardial effusion was   identified posterior to the heart. There was no evidence of   hemodynamic compromise.  Ecg today shows Afib with rate 64 bpm. NSIVCD, LAD.  I have personally reviewed and interpreted this study.   Assessment / Plan: 1. Chronic diastolic congestive heart failure - echocardiogram in January 2018 showed good LV function and only mild to moderate MR.  I am concerned that she is on too much diuretic with her marked weight loss. Will reduced lasix to 20 mg daily.  Keep feet elevated when possible.  2. Atrial fibrillation. Rate is well controlled.  Continue anticoagulation with Coumadin. Check INR today. On Coreg for rate control.  3. Hypertension, controlled.  4. Weight loss/difficulty swallowing. Encourage increased caloric intake with Boost.    I will follow up in one year.

## 2019-03-07 ENCOUNTER — Ambulatory Visit: Payer: Medicare Other | Admitting: Cardiology

## 2019-03-07 ENCOUNTER — Other Ambulatory Visit: Payer: Self-pay

## 2019-03-07 ENCOUNTER — Ambulatory Visit (INDEPENDENT_AMBULATORY_CARE_PROVIDER_SITE_OTHER): Payer: Medicare Other | Admitting: Pharmacist

## 2019-03-07 ENCOUNTER — Encounter: Payer: Self-pay | Admitting: Cardiology

## 2019-03-07 VITALS — BP 138/86 | HR 64 | Ht 66.0 in | Wt 102.6 lb

## 2019-03-07 DIAGNOSIS — I10 Essential (primary) hypertension: Secondary | ICD-10-CM

## 2019-03-07 DIAGNOSIS — Z5181 Encounter for therapeutic drug level monitoring: Secondary | ICD-10-CM

## 2019-03-07 DIAGNOSIS — I482 Chronic atrial fibrillation, unspecified: Secondary | ICD-10-CM

## 2019-03-07 DIAGNOSIS — I5032 Chronic diastolic (congestive) heart failure: Secondary | ICD-10-CM

## 2019-03-07 DIAGNOSIS — I4891 Unspecified atrial fibrillation: Secondary | ICD-10-CM | POA: Diagnosis not present

## 2019-03-07 LAB — POCT INR: INR: 2.9 (ref 2.0–3.0)

## 2019-03-07 NOTE — Addendum Note (Signed)
Addended by: Kathyrn Lass on: 03/07/2019 10:13 AM   Modules accepted: Orders

## 2019-03-07 NOTE — Patient Instructions (Addendum)
Reduce lasix to 20 mg daily  Continue  Your other therapy  Follow up in one year

## 2019-03-07 NOTE — Patient Instructions (Signed)
Continue taking 1 tablet (5 mg) daily except 1/2 tablet on Sundays and Thursdays.  Repeat INR in 2 weeks. [facility] [phone] Main [phone].

## 2019-03-08 ENCOUNTER — Telehealth: Payer: Self-pay | Admitting: Cardiology

## 2019-03-08 NOTE — Telephone Encounter (Signed)
[facility] [place] requires signed orders for the patient's Coumadin instructions. The orders will need to be faxed to the [facility] facility to be put in her chart. This will allow the AL facility to contact the pharmacy as well. Orders can be faxed to [facility] at [phone] ATTN: Jenny Reichmann

## 2019-03-08 NOTE — Telephone Encounter (Signed)
Fax was sent yesterday 10/21 to correct fax number (verified with [facility]), but they are unable to find fax today.  Will fax another copy today signed by [employer], Dr Debara Pickett   Fax sent and received by Jenny Reichmann at [facility].

## 2019-03-20 ENCOUNTER — Other Ambulatory Visit: Payer: Self-pay | Admitting: Cardiology

## 2019-03-28 ENCOUNTER — Ambulatory Visit (INDEPENDENT_AMBULATORY_CARE_PROVIDER_SITE_OTHER): Payer: Medicare Other | Admitting: *Deleted

## 2019-03-28 ENCOUNTER — Other Ambulatory Visit: Payer: Self-pay

## 2019-03-28 DIAGNOSIS — I4891 Unspecified atrial fibrillation: Secondary | ICD-10-CM | POA: Diagnosis not present

## 2019-03-28 DIAGNOSIS — Z5181 Encounter for therapeutic drug level monitoring: Secondary | ICD-10-CM | POA: Diagnosis not present

## 2019-03-28 LAB — POCT INR: INR: 2.6 (ref 2.0–3.0)

## 2019-03-28 NOTE — Patient Instructions (Signed)
Description   Continue taking 1 tablet (5 mg) daily except 1/2 tablet on Sundays and Thursdays.  Repeat INR in 4 weeks. [facility] [phone] Main [phone]. MD signed orders and were faxed to [facility]. Fax # [phone])

## 2019-04-10 ENCOUNTER — Other Ambulatory Visit: Payer: Self-pay | Admitting: Cardiology

## 2019-04-25 ENCOUNTER — Ambulatory Visit (INDEPENDENT_AMBULATORY_CARE_PROVIDER_SITE_OTHER): Payer: Medicare Other | Admitting: *Deleted

## 2019-04-25 ENCOUNTER — Other Ambulatory Visit: Payer: Self-pay

## 2019-04-25 DIAGNOSIS — I4891 Unspecified atrial fibrillation: Secondary | ICD-10-CM | POA: Diagnosis not present

## 2019-04-25 DIAGNOSIS — Z5181 Encounter for therapeutic drug level monitoring: Secondary | ICD-10-CM | POA: Diagnosis not present

## 2019-04-25 LAB — POCT INR: INR: 2.6 (ref 2.0–3.0)

## 2019-04-25 NOTE — Patient Instructions (Addendum)
Description   Continue taking 1 tablet (5 mg) daily except 1/2 tablet (2.5 mg) on Sundays and Thursdays. Repeat INR in 5 weeks. [facility] [phone] Main [phone]. MD signed orders and were faxed to [facility]. Fax # [phone])

## 2019-04-27 ENCOUNTER — Telehealth: Payer: Self-pay | Admitting: *Deleted

## 2019-04-27 NOTE — Telephone Encounter (Signed)
Dottie RN from [facility] called to notify us that the pt tested positive for COVID 19. The pt was tested on 04/25/2019 as a weekly COVID 19 test that her facility does a weekly and the results came back last pm and she called to [place] today. She the states the pt has no symptoms. Asked if the pt starts to have symptoms and unable to have INR checked in our facility if they would be able to do so and she stated they are not as she is in assisted living. Asked if does have a decline will they reach out to PCP for Home health to draw labs or check INR and she stated yes and that she update [place] and if the gets to feeling bad and has symptoms she will be sent to the hospital. Will await an update.

## 2019-05-30 ENCOUNTER — Emergency Department (HOSPITAL_COMMUNITY)
Admission: EM | Admit: 2019-05-30 | Discharge: 2019-05-30 | Disposition: A | Discharge status: Home / Self Care | Payer: Medicare Other | Attending: Emergency Medicine | Admitting: Emergency Medicine

## 2019-05-30 ENCOUNTER — Encounter (HOSPITAL_COMMUNITY): Payer: Self-pay

## 2019-05-30 ENCOUNTER — Emergency Department (HOSPITAL_COMMUNITY): Payer: Medicare Other

## 2019-05-30 ENCOUNTER — Ambulatory Visit (INDEPENDENT_AMBULATORY_CARE_PROVIDER_SITE_OTHER): Payer: Medicare Other | Admitting: Pharmacist

## 2019-05-30 DIAGNOSIS — I110 Hypertensive heart disease with heart failure: Secondary | ICD-10-CM | POA: Insufficient documentation

## 2019-05-30 DIAGNOSIS — Y929 Unspecified place or not applicable: Secondary | ICD-10-CM | POA: Insufficient documentation

## 2019-05-30 DIAGNOSIS — S80921A Unspecified superficial injury of right lower leg, initial encounter: Secondary | ICD-10-CM | POA: Diagnosis present

## 2019-05-30 DIAGNOSIS — Z5181 Encounter for therapeutic drug level monitoring: Secondary | ICD-10-CM

## 2019-05-30 DIAGNOSIS — S8011XA Contusion of right lower leg, initial encounter: Secondary | ICD-10-CM | POA: Diagnosis not present

## 2019-05-30 DIAGNOSIS — I5022 Chronic systolic (congestive) heart failure: Secondary | ICD-10-CM | POA: Diagnosis not present

## 2019-05-30 DIAGNOSIS — Z79899 Other long term (current) drug therapy: Secondary | ICD-10-CM | POA: Insufficient documentation

## 2019-05-30 DIAGNOSIS — W228XXA Striking against or struck by other objects, initial encounter: Secondary | ICD-10-CM | POA: Insufficient documentation

## 2019-05-30 DIAGNOSIS — I480 Paroxysmal atrial fibrillation: Secondary | ICD-10-CM | POA: Diagnosis not present

## 2019-05-30 DIAGNOSIS — E039 Hypothyroidism, unspecified: Secondary | ICD-10-CM | POA: Insufficient documentation

## 2019-05-30 DIAGNOSIS — Y939 Activity, unspecified: Secondary | ICD-10-CM | POA: Insufficient documentation

## 2019-05-30 DIAGNOSIS — Y999 Unspecified external cause status: Secondary | ICD-10-CM | POA: Diagnosis not present

## 2019-05-30 LAB — CBC WITH DIFFERENTIAL/PLATELET
Abs Immature Granulocytes: 0.01 10*3/uL (ref 0.00–0.07)
Basophils Absolute: 0 10*3/uL (ref 0.0–0.1)
Basophils Relative: 1 %
Eosinophils Absolute: 0.1 10*3/uL (ref 0.0–0.5)
Eosinophils Relative: 1 %
HCT: 39.5 % (ref 36.0–46.0)
Hemoglobin: 12.7 g/dL (ref 12.0–15.0)
Immature Granulocytes: 0 %
Lymphocytes Relative: 17 %
Lymphs Abs: 0.9 10*3/uL (ref 0.7–4.0)
MCH: 31.8 pg (ref 26.0–34.0)
MCHC: 32.2 g/dL (ref 30.0–36.0)
MCV: 98.8 fL (ref 80.0–100.0)
Monocytes Absolute: 0.5 10*3/uL (ref 0.1–1.0)
Monocytes Relative: 10 %
Neutro Abs: 4 10*3/uL (ref 1.7–7.7)
Neutrophils Relative %: 71 %
Platelets: 157 10*3/uL (ref 150–400)
RBC: 4 MIL/uL (ref 3.87–5.11)
RDW: 17.8 % — ABNORMAL HIGH (ref 11.5–15.5)
WBC: 5.6 10*3/uL (ref 4.0–10.5)
nRBC: 0 % (ref 0.0–0.2)

## 2019-05-30 LAB — BASIC METABOLIC PANEL
Anion gap: 10 (ref 5–15)
BUN: 19 mg/dL (ref 8–23)
CO2: 31 mmol/L (ref 22–32)
Calcium: 9.6 mg/dL (ref 8.9–10.3)
Chloride: 95 mmol/L — ABNORMAL LOW (ref 98–111)
Creatinine, Ser: 0.67 mg/dL (ref 0.44–1.00)
GFR calc Af Amer: >60 mL/min (ref >60)
GFR calc non Af Amer: >60 mL/min (ref >60)
Glucose, Bld: 90 mg/dL (ref 70–99)
Potassium: 3.8 mmol/L (ref 3.5–5.1)
Sodium: 136 mmol/L (ref 135–145)

## 2019-05-30 LAB — PROTIME-INR
INR: 3.6 — ABNORMAL HIGH (ref 0.8–1.2)
Prothrombin Time: 36.1 seconds — ABNORMAL HIGH (ref 11.4–15.2)

## 2019-05-30 NOTE — Discharge Instructions (Addendum)
You were evaluated in the emergency department for large hematoma on your leg.  You had an x-ray that did not show any signs of fracture.  Your blood work was significant for an elevated INR and you will need to hold your warfarin for 2 days and have this rechecked.  There is no obvious sign of infection.  We recommend using a dressing and a compression wrap.  Return if any worsening symptoms.

## 2019-05-30 NOTE — ED Notes (Signed)
Patient transported to X-ray 

## 2019-05-30 NOTE — ED Triage Notes (Addendum)
Patient arrived via [facility] from [facility].  Called out for hematoma on right leg (inside of right calf). Size of soft ball.   No known trauma.  Denies falling.  Denies LOC.   Takes Warfarin per ems  Thready pulse in right leg per ems. Marked by EMS.   Patient reports aching pain 2/10 pain    Per EMS the hematoma rupture while moving patient to stretcher.   A/Ox4 Slow with answering questions  At baseline per staff at facility.  Per staff hematoma got bigger over night. Size of a pecan to size of softball.  BP-142/88 p-68 RR-18 96% RA 98.4 oral    DNR per ems.   DNR at bedside per ems.

## 2019-05-30 NOTE — ED Notes (Signed)
New dressing placed on right calf with non stick dressing and 3 abd pads placed and kurlex placed.

## 2019-05-30 NOTE — ED Notes (Signed)
Report given to Rosey Bath at [facility].

## 2019-05-30 NOTE — ED Provider Notes (Signed)
[name]   CSN: [id number] Arrival date & time: 05/30/19  7591     History No chief complaint on file.   Gabriela Hernandez is a 86 y.o. female.  She is brought in by ambulance for evaluation of a hematoma on her right lower leg.  No reported trauma.  Patient herself denies complaints.  When asked about her legs she said she must have bumped it on something.  No chest pain or shortness of breath. The history is provided by the patient and the EMS personnel.  Leg Pain Location:  Leg Leg location:  R leg Pain details:    Quality:  Pressure   Radiates to:  Does not radiate   Severity:  Mild   Onset quality:  Unable to specify   Timing:  Constant   Progression:  Unchanged Chronicity:  New Relieved by:  None tried Worsened by:  Nothing Ineffective treatments:  None tried Associated symptoms: no back pain and no fever        Past Medical History:  Diagnosis Date  . Atrial fibrillation ([facility])   . Chronic systolic CHF (congestive heart failure) ([facility]) 04/05/2012  . Dilated cardiomyopathy ([facility])    with ejection fraction of 30-35%  . Dysphagia   . Esophageal stricture   . History of shingles   . Hypertension   . Hypothyroidism   . Lightheadedness     Patient Active Problem List   Diagnosis Date Noted  . Hypertension 11/17/2016  . Sepsis ([facility]) secondary to Escherichia coli bacteremia 06/28/2016  . AKI (acute kidney injury) ([facility]) 06/28/2016  . Demand ischemia ([facility]) 06/28/2016  . Elevated troponin 06/28/2016  . Elevated CK 06/28/2016  . Rhabdomyolysis 06/28/2016  . Septic shock ([facility])   . Acute pulmonary edema ([facility])   . Dehydration 06/27/2016  . Diarrhea 06/27/2016  . Acute hyponatremia 06/27/2016  . Fall 06/27/2016  . Hypothyroidism 06/27/2016  . Hyperbilirubinemia 06/27/2016  . Encounter for therapeutic drug monitoring 06/14/2013  . Dyspnea 02/16/2013  . Fatigue 10/03/2012  . Chronic systolic CHF (congestive heart  failure) ([facility]) 04/05/2012  . Dilated cardiomyopathy (H[facility])   . Hypertensive heart disease with CHF (H[facility])   . Atrial fibrillation ([facility]) 08/19/2010    Past Surgical History:  Procedure Laterality Date  . APPENDECTOMY    . CATARACT EXTRACTION    . CESAREAN SECTION    . FOOT SURGERY    . THYROIDECTOMY    . TONSILLECTOMY    . VEIN LIGATION       OB History   No obstetric history on file.     Family History  Problem Relation Age of Onset  . Hypertension Mother   . Heart attack Father     Social History   Tobacco Use  . Smoking status: Never Smoker  . Smokeless tobacco: Never Used  Substance Use Topics  . Alcohol use: No  . Drug use: No    Home Medications Prior to Admission medications   Medication Sig Start Date End Date Taking? Authorizing Provider  carvedilol (COREG) 3.125 MG tablet TAKE (1) TABLET BY MOUTH TWICE DAILY. 01/19/17   [place], Peter M, MD  docusate sodium (COLACE) 100 MG capsule Take 200 mg by mouth as needed.     [provider]  furosemide (LASIX) 20 MG tablet TAKE (1) TABLET BY MOUTH ONCE DAILY. 04/10/19   [place], Peter M, MD  levothyroxine (SYNTHROID, LEVOTHROID) 125 MCG tablet Take 1 tablet by mouth daily. 09/21/16   [provider]  Multiple Vitamins-Minerals (MULTIVITAMIN WITH MINERALS) tablet Take 1 tablet by mouth daily.    [provider]  potassium chloride (K-DUR,KLOR-CON) 10 MEQ tablet Take 10 mEq by mouth daily.    [provider]  quinapril (ACCUPRIL) 10 MG tablet TAKE 1/2 TABLET(5MG ) BY MOUTH ONCE A DAY. 10/14/16   [place], Peter M, MD  warfarin (COUMADIN) 5 MG tablet Take 1 to 1.5 tablets by mouth daily as directed by coumadin clinic 07/19/18   [place], Peter M, MD    Allergies    Aspirin  Review of Systems   Review of Systems  Constitutional: Negative for fever.  HENT: Negative for sore throat.   Eyes: Negative for pain.  Respiratory: Negative for shortness of breath.   Cardiovascular: Negative for chest  pain.  Gastrointestinal: Negative for abdominal pain.  Genitourinary: Negative for dysuria.  Musculoskeletal: Negative for back pain.  Skin: Positive for wound. Negative for rash.  Neurological: Negative for headaches.    Physical Exam Updated Vital Signs BP (!) 146/84   Pulse (!) 56   Temp 97.8 F (36.6 C) (Oral)   Resp 17   SpO2 100%   Physical Exam Vitals and nursing note reviewed.  Constitutional:      General: She is not in acute distress.    Appearance: She is well-developed.  HENT:     Head: Normocephalic and atraumatic.  Eyes:     Conjunctiva/sclera: Conjunctivae normal.  Cardiovascular:     Rate and Rhythm: Normal rate and regular rhythm.     Heart sounds: No murmur.  Pulmonary:     Effort: Pulmonary effort is normal. No respiratory distress.     Breath sounds: Normal breath sounds.  Abdominal:     Palpations: Abdomen is soft.     Tenderness: There is no abdominal tenderness.  Musculoskeletal:        General: Normal range of motion.     Cervical back: Neck supple.     Comments: She has venous stasis changes bilaterally.  On her right lower extremity she has a proximately 8 cm hematoma medial that has dehisced and oozing some blood.  Skin:    General: Skin is warm and dry.     Capillary Refill: Capillary refill takes less than 2 seconds.  Neurological:     General: No focal deficit present.     Mental Status: She is alert.       ED Results / Procedures / Treatments   Labs (all labs ordered are listed, but only abnormal results are displayed) Labs Reviewed  BASIC METABOLIC PANEL - Abnormal; Notable for the following components:      Result Value   Chloride 95 (*)    All other components within normal limits  CBC WITH DIFFERENTIAL/PLATELET - Abnormal; Notable for the following components:   RDW 17.8 (*)    All other components within normal limits  PROTIME-INR - Abnormal; Notable for the following components:   Prothrombin Time 36.1 (*)    INR 3.6  (*)    All other components within normal limits    EKG None  Radiology DG Tibia/Fibula Right  Result Date: 05/30/2019 CLINICAL DATA:  Lower extremity hematoma. EXAM: RIGHT TIBIA AND FIBULA - 2 VIEW COMPARISON:  None. FINDINGS: There is a large soft tissue "mass" involving the medial aspect of the right lower extremity, likely hematoma. No underlying tibial fractures identified. The fibula is intact. The knee and ankle joints are maintained. Vascular calcifications noted. IMPRESSION: No acute bony findings.  Electronically Signed   By: Marijo Sanes M.D.   On: 05/30/2019 08:57    Procedures Procedures (including critical care time)  Medications Ordered in ED Medications - No data to display  ED Course  I have reviewed the triage vital signs and the nursing notes.  Pertinent labs & imaging results that were available during my care of the patient were reviewed by me and considered in my medical decision making (see chart for details).  Clinical Course as of May 29 1722  Tue May 29, 4354  6317 86 year old female here with soft tissue hematoma of her right lower leg.  She does not recall any trauma.  She is anticoagulated on Coumadin.  Differential includes occult fracture, anemia, supratheraputic coags.  Check x-ray and some lab work   [MB]  0900 Patient is slightly supratherapeutic with an INR 3.6.  Hemoglobin and chemistry normal.  X-ray showing no fractures.  Likely had some unrecognized trauma and now has a hematoma.  We will have the nurse redressed this and return the patient to our facility.  They will need to hold her warfarin for a day or 2 until her INR improves.   [MB]    Clinical Course User Index [MB] Hayden Rasmussen, MD   MDM Rules/Calculators/A&P                       Final Clinical Impression(s) / ED Diagnoses Final diagnoses:  Leg hematoma, right, initial encounter    Rx / DC Orders ED Discharge Orders    None       Hayden Rasmussen, MD 05/30/19  1724

## 2019-05-30 NOTE — ED Notes (Signed)
Attempted to call [facility]. No answer.   [facility] at bedside.

## 2019-05-30 NOTE — ED Notes (Signed)
[facility] has been dispatched.  

## 2019-05-30 NOTE — ED Notes (Signed)
Assisted patient with bed pan. Patient voided about 200 mLs. UA sample at bedside. No orders at this time. New brief placed on patient and patient repositioned in bed. Call bell within reach.

## 2019-05-31 ENCOUNTER — Emergency Department (HOSPITAL_COMMUNITY)
Admission: EM | Admit: 2019-05-31 | Discharge: 2019-05-31 | Disposition: A | Discharge status: Home / Self Care | Payer: Medicare Other | Attending: Emergency Medicine | Admitting: Emergency Medicine

## 2019-05-31 ENCOUNTER — Encounter (HOSPITAL_COMMUNITY): Payer: Self-pay | Admitting: Emergency Medicine

## 2019-05-31 ENCOUNTER — Telehealth: Payer: Self-pay

## 2019-05-31 ENCOUNTER — Other Ambulatory Visit: Payer: Self-pay

## 2019-05-31 DIAGNOSIS — E039 Hypothyroidism, unspecified: Secondary | ICD-10-CM | POA: Diagnosis not present

## 2019-05-31 DIAGNOSIS — I110 Hypertensive heart disease with heart failure: Secondary | ICD-10-CM | POA: Diagnosis not present

## 2019-05-31 DIAGNOSIS — I5022 Chronic systolic (congestive) heart failure: Secondary | ICD-10-CM | POA: Insufficient documentation

## 2019-05-31 DIAGNOSIS — S8011XD Contusion of right lower leg, subsequent encounter: Secondary | ICD-10-CM | POA: Diagnosis present

## 2019-05-31 DIAGNOSIS — X58XXXD Exposure to other specified factors, subsequent encounter: Secondary | ICD-10-CM | POA: Diagnosis not present

## 2019-05-31 DIAGNOSIS — Z7901 Long term (current) use of anticoagulants: Secondary | ICD-10-CM | POA: Insufficient documentation

## 2019-05-31 DIAGNOSIS — Z79899 Other long term (current) drug therapy: Secondary | ICD-10-CM | POA: Insufficient documentation

## 2019-05-31 DIAGNOSIS — S8011XS Contusion of right lower leg, sequela: Secondary | ICD-10-CM

## 2019-05-31 LAB — CBC WITH DIFFERENTIAL/PLATELET
Abs Immature Granulocytes: 0.02 10*3/uL (ref 0.00–0.07)
Basophils Absolute: 0 10*3/uL (ref 0.0–0.1)
Basophils Relative: 1 %
Eosinophils Absolute: 0 10*3/uL (ref 0.0–0.5)
Eosinophils Relative: 1 %
HCT: 32.8 % — ABNORMAL LOW (ref 36.0–46.0)
Hemoglobin: 10.5 g/dL — ABNORMAL LOW (ref 12.0–15.0)
Immature Granulocytes: 0 %
Lymphocytes Relative: 16 %
Lymphs Abs: 0.8 10*3/uL (ref 0.7–4.0)
MCH: 31.3 pg (ref 26.0–34.0)
MCHC: 32 g/dL (ref 30.0–36.0)
MCV: 97.9 fL (ref 80.0–100.0)
Monocytes Absolute: 0.6 10*3/uL (ref 0.1–1.0)
Monocytes Relative: 11 %
Neutro Abs: 3.8 10*3/uL (ref 1.7–7.7)
Neutrophils Relative %: 71 %
Platelets: 146 10*3/uL — ABNORMAL LOW (ref 150–400)
RBC: 3.35 MIL/uL — ABNORMAL LOW (ref 3.87–5.11)
RDW: 17.8 % — ABNORMAL HIGH (ref 11.5–15.5)
WBC: 5.2 10*3/uL (ref 4.0–10.5)
nRBC: 0 % (ref 0.0–0.2)

## 2019-05-31 LAB — BASIC METABOLIC PANEL
Anion gap: 8 (ref 5–15)
BUN: 16 mg/dL (ref 8–23)
CO2: 31 mmol/L (ref 22–32)
Calcium: 8.9 mg/dL (ref 8.9–10.3)
Chloride: 97 mmol/L — ABNORMAL LOW (ref 98–111)
Creatinine, Ser: 0.77 mg/dL (ref 0.44–1.00)
GFR calc Af Amer: >60 mL/min (ref >60)
GFR calc non Af Amer: >60 mL/min (ref >60)
Glucose, Bld: 104 mg/dL — ABNORMAL HIGH (ref 70–99)
Potassium: 4 mmol/L (ref 3.5–5.1)
Sodium: 136 mmol/L (ref 135–145)

## 2019-05-31 LAB — PROTIME-INR
INR: 4.3 (ref 0.8–1.2)
Prothrombin Time: 41 seconds — ABNORMAL HIGH (ref 11.4–15.2)

## 2019-05-31 MED ORDER — PHYTONADIONE 5 MG PO TABS
2.5000 mg | ORAL_TABLET | ORAL | Status: AC
  Administered 2019-05-31: 2.5 mg via ORAL
  Filled 2019-05-31: qty 1

## 2019-05-31 MED ORDER — LIDOCAINE-EPINEPHRINE-TETRACAINE (LET) TOPICAL GEL
3.0000 mL | Freq: Once | TOPICAL | Status: DC
  Filled 2019-05-31: qty 3

## 2019-05-31 NOTE — Discharge Instructions (Addendum)
You have been diagnosed today with Hematoma of right leg.  At this time there does not appear to be the presence of an emergent medical condition, however there is always the potential for conditions to change. Please read and follow the below instructions.  Please return to the Emergency Department immediately for any new or worsening symptoms. Please be sure to follow up with your Primary Care Provider within one week regarding your visit today; please call their office to schedule an appointment even if you are feeling better for a follow-up visit. Please have your INR checked tomorrow and discuss with your primary providers about when to restart your Coumadin therapy. Case management has been consulted to help with changing your wound dressing at your facility, this will need to be followed and wound dressing will need to be changed daily  Get help right away if: Your pain gets worse. Your pain is not getting better with medicine. Your skin over the hematoma breaks or starts to bleed. You Pass out. Feel weak. Become short of breath. Have chest pain You have a hematoma on your scalp that is caused by a fall or injury, and you: Have a headache that gets worse. Have trouble speaking or understanding speech. Become less alert or you pass out. You have a red streak that goes away from the skin tear. You have a fever and chills, and your symptoms get worse all of a sudden. You have any new/concerning or worsening of symptoms  Please read the additional information packets attached to your discharge summary.  Note: Portions of this text may have been transcribed using voice recognition software. Every effort was made to ensure accuracy; however, inadvertent computerized transcription errors may still be present.

## 2019-05-31 NOTE — ED Triage Notes (Signed)
Pt. Arrived via EMS with largfe Hematoma to right shin. Pt. On coumadin and facility unable to stop bleeding. Pt. Went to [facility] who discharged her early yesterday. Pt. Is still bleeding from same hematoma was transported here. Pt. Denis any pain. EMS vitas [facility] enroute to [facility]

## 2019-05-31 NOTE — ED Notes (Signed)
Pt dc'd to [facility] w/all belongings, alert and oriented to her norm, pt transported via [facility]. Report given to [facility] at [facility]

## 2019-05-31 NOTE — Telephone Encounter (Signed)
Pt went to ED with hematoma of right leg yesterday. Pt had INR checked yesterday at ED INR 3.6 called into office and dosed by Malena Peer, pharmacist. Pt was sent back to the ED yesterday late evening early am, INR rechecked INR 4.3, 2.5mg  Vit K given to pt.  Pt's hematoma was actively bleeding.  Pt was instructed to hold her Warfarin today and recheck INR in 24 hours with further management by ordering MD.  Oneita Hurt needs written order to hold Warfarin for pt today.  Pt's hematoma continues to bleed per Mitzi Hansen caregiver at [facility] and has saturated compession dressing and all other coverings.  Pt's primary MD has referred pt to [facility], per [facility] they are hoping to have Hospice in today for wound dressing and management.  They do not draw labs at [facility], but [facility]osp[facility] should be able to check an INR once pt is established/enrolled.  [facility] will notify Hospice pt will need an INR today or tomorrow.  Discussed pt with Margaretmary Dys, PharmD she states we can write an order to hold Warfarin today 1/13 and tomorrow 1/14 since pt is still actively bleeding, and check INR on 1/14 or 1/15 whenever Hospice is out to establish pt with their services. Orders written and signed by Dr Clifton James (DOD) and faxed back to [facility] as requested [phone].

## 2019-05-31 NOTE — ED Provider Notes (Signed)
[facility] EMERGENCY DEPARTMENT Provider Note   CSN: [id number] Arrival date & time: 05/31/19  0008     History Chief Complaint  Patient presents with  . Leg Injury    Gabriela Hernandez is a 86 y.o. female history of A. fib on Coumadin, CHF, hypertension.  Patient presents today from assisted living facility for bleeding hematoma of the right lower leg.  Patient was seen in the ED yesterday for the same, at that time she had CBC PT/INR, BMP and x-ray of the area.  She was placed on a new dressing and discharged with instructions to hold Coumadin for 1-2 days.  Patient sent back in from facility for bleeding through dressing, she denies any new injuries or concerns today reports that she is feeling well.  She describes a minimal pressure pain to the area nonradiating worsened with touching improved with rest.  Denies fever/chills, fall/injury, shortness of breath, chest pain, lightheadedness, numbness/weakness, tingling or any additional concerns today.  HPI     Past Medical History:  Diagnosis Date  . Atrial fibrillation ([place])   . Chronic systolic CHF (congestive heart failure) ([place]) 04/05/2012  . Dilated cardiomyopathy ([place])    with ejection fraction of 30-35%  . Dysphagia   . Esophageal stricture   . History of shingles   . Hypertension   . Hypothyroidism   . Lightheadedness     Patient Active Problem List   Diagnosis Date Noted  . Hypertension 11/17/2016  . Sepsis ([place]) secondary to Escherichia coli bacteremia 06/28/2016  . AKI (acute kidney injury) ([place]) 06/28/2016  . Demand ischemia ([place]) 06/28/2016  . Elevated troponin 06/28/2016  . Elevated CK 06/28/2016  . Rhabdomyolysis 06/28/2016  . Septic shock ([place])   . Acute pulmonary edema (HCC)   . Dehydration 06/27/2016  . Diarrhea 06/27/2016  . Acute hyponatremia 06/27/2016  . Fall 06/27/2016  . Hypothyroidism 06/27/2016  . Hyperbilirubinemia 06/27/2016  . Encounter for therapeutic drug  monitoring 06/14/2013  . Dyspnea 02/16/2013  . Fatigue 10/03/2012  . Chronic systolic CHF (congestive heart failure) ([place]) 04/05/2012  . Dilated cardiomyopathy ([place])   . Hypertensive heart disease with CHF ([place])   . Atrial fibrillation ([place]) 08/19/2010    Past Surgical History:  Procedure Laterality Date  . APPENDECTOMY    . CATARACT EXTRACTION    . CESAREAN SECTION    . FOOT SURGERY    . THYROIDECTOMY    . TONSILLECTOMY    . VEIN LIGATION       OB History   No obstetric history on file.     Family History  Problem Relation Age of Onset  . Hypertension Mother   . Heart attack Father     Social History   Tobacco Use  . Smoking status: Never Smoker  . Smokeless tobacco: Never Used  Substance Use Topics  . Alcohol use: No  . Drug use: No    Home Medications Prior to Admission medications   Medication Sig Start Date End Date Taking? Authorizing Provider  carboxymethylcellulose (REFRESH PLUS) 0.5 % SOLN Place 1 drop into both eyes every 6 (six) hours as needed (Dry eyes).   Yes [provider]  carvedilol (COREG) 3.125 MG tablet TAKE (1) TABLET BY MOUTH TWICE DAILY. Patient taking differently: Take 3.125 mg by mouth 2 (two) times daily with a meal.  01/19/17  Yes Martinique, Peter M, MD  docusate sodium (COLACE) 100 MG capsule Take 100 mg by mouth daily as needed for mild constipation or  moderate constipation.    Yes [provider]  furosemide (LASIX) 20 MG tablet TAKE (1) TABLET BY MOUTH ONCE DAILY. Patient taking differently: Take 20 mg by mouth 2 (two) times daily. In the morning and at lunch 04/10/19  Yes [place], Peter M, MD  lactose free nutrition (BOOST) LIQD Take 237 mLs by mouth daily as needed (Nutrition).   Yes [provider]  levothyroxine (SYNTHROID) 112 MCG tablet Take 112 mcg by mouth daily.  09/21/16  Yes [provider]  Multiple Vitamins-Minerals (MULTIVITAMIN WITH MINERALS) tablet Take 1 tablet by mouth daily.   Yes  [provider]  potassium chloride (K-DUR,KLOR-CON) 10 MEQ tablet Take 10 mEq by mouth daily.   Yes [provider]  quinapril (ACCUPRIL) 10 MG tablet TAKE 1/2 TABLET(5MG ) BY MOUTH ONCE A DAY. Patient taking differently: Take 5 mg by mouth daily.  10/14/16  Yes [place], Peter M, MD  triamcinolone cream (KENALOG) 0.1 % Apply 1 application topically daily as needed (Vulva).   Yes [provider]  warfarin (COUMADIN) 5 MG tablet Take 1 to 1.5 tablets by mouth daily as directed by coumadin clinic Patient taking differently: Take 5 mg by mouth See admin instructions. Take 2.5 mg on Sunday and Thursday Take 5 mg all the other days in the evening 07/19/18  Yes [place], Peter M, MD    Allergies    Aspirin  Review of Systems   Review of Systems Ten systems are reviewed and are negative for acute change except as noted in the HPI Physical Exam Updated Vital Signs BP 111/81   Pulse 68   Temp 97.9 F (36.6 C) (Oral)   Resp 12   Ht 5\' 4"  (1.626 m)   Wt 49 kg   SpO2 99%   BMI 18.54 kg/m   Physical Exam Constitutional:      General: She is not in acute distress.    Appearance: Normal appearance. She is well-developed. She is not ill-appearing or diaphoretic.  HENT:     Head: Normocephalic and atraumatic.     Right Ear: External ear normal.     Left Ear: External ear normal.     Nose: Nose normal.  Eyes:     General: Vision grossly intact. Gaze aligned appropriately.     Pupils: Pupils are equal, round, and reactive to light.  Neck:     Trachea: Trachea and phonation normal. No tracheal deviation.  Pulmonary:     Effort: Pulmonary effort is normal. No respiratory distress.  Abdominal:     General: There is no distension.     Palpations: Abdomen is soft.     Tenderness: There is no abdominal tenderness. There is no guarding or rebound.  Musculoskeletal:        General: Normal range of motion.     Cervical back: Normal range of motion.  Skin:    General: Skin  is warm and dry.          Comments: Hematoma as pictured below, with overlying skin tear/laceration, slow venous blood.  No surrounding signs of infection.  Neurological:     Mental Status: She is alert.     GCS: GCS eye subscore is 4. GCS verbal subscore is 5. GCS motor subscore is 6.     Comments: Speech is clear and goal oriented, follows commands Major Cranial nerves without deficit, no facial droop Moves extremities without ataxia, coordination intact  Psychiatric:        Behavior: Behavior normal.  ED Results / Procedures / Treatments   Labs (all labs ordered are listed, but only abnormal results are displayed) Labs Reviewed  CBC WITH DIFFERENTIAL/PLATELET - Abnormal; Notable for the following components:      Result Value   RBC 3.35 (*)    Hemoglobin 10.5 (*)    HCT 32.8 (*)    RDW 17.8 (*)    Platelets 146 (*)    All other components within normal limits  BASIC METABOLIC PANEL - Abnormal; Notable for the following components:   Chloride 97 (*)    Glucose, Bld 104 (*)    All other components within normal limits  PROTIME-INR - Abnormal; Notable for the following components:   Prothrombin Time 41.0 (*)    INR 4.3 (*)    All other components within normal limits    EKG EKG Interpretation  Date/Time:  Wednesday May 31 2019 00:20:47 EST Ventricular Rate:  60 PR Interval:    QRS Duration: 146 QT Interval:  529 QTC Calculation: 529 R Axis:   -99 Text Interpretation: Atrial fibrillation Ventricular premature complex Right bundle branch block Wider than prior Confirmed by Ross Marcus ([id number]) on 05/31/2019 12:38:49 AM   Radiology DG Tibia/Fibula Right  Result Date: 05/30/2019 CLINICAL DATA:  Lower extremity hematoma. EXAM: RIGHT TIBIA AND FIBULA - 2 VIEW COMPARISON:  None. FINDINGS: There is a large soft tissue "mass" involving the medial aspect of the right lower extremity, likely hematoma. No underlying tibial fractures identified. The fibula is  intact. The knee and ankle joints are maintained. Vascular calcifications noted. IMPRESSION: No acute bony findings. Electronically Signed   By: Rudie Meyer M.D.   On: 05/30/2019 08:57    Procedures Procedures (including critical care time)  Medications Ordered in ED Medications  phytonadione (VITAMIN K) tablet 2.5 mg (2.5 mg Oral Given 05/31/19 0345)    ED Course  I have reviewed the triage vital signs and the nursing notes.  Pertinent labs & imaging results that were available during my care of the patient were reviewed by me and considered in my medical decision making (see chart for details).  Clinical Course as of May 30 657  Wed May 31, 2019  0300 Wound care   [BM]    Clinical Course User Index [BM] Elizabeth Palau   MDM Rules/Calculators/A&P                     Chart review 05/29/2019:  CBC nonacute BMP nonacute PT/INR 36.1, 3.6 DG right tib-fib: IMPRESSION: No acute bony findings. - Patient sent in for bleedthrough however dressing.  On examination patient well-appearing no acute distress denies any new injuries or concerns, capillary refill and sensation intact to all toes.  Large hematoma with overlying skin tear present.    BMP nonacute PT/INR, INR 4.3, PT 41.0 CBC hemoglobin 10.5, patient without symptoms of anemia, similar to prior, she has no history suggestive of internal bleeding, additionally vital signs stable without tachycardia or hypoxia.  Additionally patient does not appear to have lost a significant amount of blood in her packing today, no indication for further work-up at this time.  Patient seen and evaluated by Dr. Clayborne Dana, will attempt evacuation of hematoma to allow for improved dressing application for bleeding control and give low dose vitamin K. - 3 AM: Area cleansed thoroughly, skin tear was retracted and it was found that hematoma to involve deeper layers of skin as well which are still neurovascularly intact.  The superficial layer  of skin was removed but thicker and still viable dermis was left in place.  Area was then again irrigated thoroughly, wet to dry packing placed. - 6:30 AM: Patient reassessed resting comfortably no acute distress, sleeping and easily arousable to voice.  Reports that her dressing feels comfortable and she has no concerns at this time.  She was ambulated by nursing staff and there has been no bleedthrough from the dressing.  Case management and face-to-face consult ordered for patient to have wound care at her facility.  Patient is requesting discharge, no indication for further work-up or admission at this time.  Patient will follow up with her PCP for recheck and 1-2 days.  Additionally patient will have INR checked tomorrow and discuss restarting Coumadin with providers at a time.  At this time there does not appear to be any evidence of an acute emergency medical condition and the patient appears stable for discharge with appropriate outpatient follow up. Diagnosis was discussed with patient who verbalizes understanding of care plan and is agreeable to discharge. I have discussed return precautions with patient who verbalizes understanding of return precautions. Patient encouraged to follow-up with their PCP. All questions answered.  Patient's case discussed with Dr. Clayborne Dana who agrees with plan to discharge with follow-up.   Note: Portions of this report may have been transcribed using voice recognition software. Every effort was made to ensure accuracy; however, inadvertent computerized transcription errors may still be present. Final Clinical Impression(s) / ED Diagnoses Final diagnoses:  Hematoma of leg, right, sequela    Rx / DC Orders ED Discharge Orders         Ordered    Home Health    Comments: Wound Care   05/31/19 0627    Face-to-face encounter (required for Medicare/Medicaid patients)    Comments: I Bill Salinas certify that this patient is under my care and that I, or a nurse  practitioner or physician's assistant working with me, had a face-to-face encounter that meets the physician face-to-face encounter requirements with this patient on 05/31/2019. The encounter with the patient was in whole, or in part for the following medical condition(s) which is the primary reason for home health care (List medical condition): Wound Care of right lower leg.   05/31/19 0627           Bill Salinas, PA-C 05/31/19 0700    Mesner, Barbara Cower, MD 05/31/19 [phone]

## 2019-06-02 ENCOUNTER — Ambulatory Visit (INDEPENDENT_AMBULATORY_CARE_PROVIDER_SITE_OTHER): Payer: Medicare Other | Admitting: *Deleted

## 2019-06-02 DIAGNOSIS — Z5181 Encounter for therapeutic drug level monitoring: Secondary | ICD-10-CM

## 2019-06-02 DIAGNOSIS — I480 Paroxysmal atrial fibrillation: Secondary | ICD-10-CM

## 2019-06-02 LAB — POCT INR: INR: 1.6 — AB (ref 2.0–3.0)

## 2019-06-02 NOTE — Patient Instructions (Addendum)
Description   Spoke with Norman Clay RN and instructed to take an extra 1/2 tablet (7.5mg ) today then continue taking 1 tablet (5 mg) daily except 1/2 tablet (2.5 mg) on Sundays and Thursdays. Repeat INR on Tuesday. Coumadin Clinic [phone] Main [phone]. MD signed orders and were faxed to [facility]. Fax # [phone]) Orders sent to facility to [facility] and gave instructions.

## 2019-06-04 ENCOUNTER — Emergency Department (HOSPITAL_COMMUNITY)
Admission: EM | Admit: 2019-06-04 | Discharge: 2019-06-05 | Disposition: A | Discharge status: Skilled Nursing Facility | Attending: Emergency Medicine | Admitting: Emergency Medicine

## 2019-06-04 ENCOUNTER — Emergency Department (HOSPITAL_COMMUNITY)

## 2019-06-04 ENCOUNTER — Other Ambulatory Visit: Payer: Self-pay

## 2019-06-04 ENCOUNTER — Encounter (HOSPITAL_COMMUNITY): Payer: Self-pay | Admitting: Emergency Medicine

## 2019-06-04 DIAGNOSIS — S022XXA Fracture of nasal bones, initial encounter for closed fracture: Secondary | ICD-10-CM

## 2019-06-04 DIAGNOSIS — R519 Headache, unspecified: Secondary | ICD-10-CM | POA: Diagnosis not present

## 2019-06-04 DIAGNOSIS — W19XXXA Unspecified fall, initial encounter: Secondary | ICD-10-CM

## 2019-06-04 DIAGNOSIS — Y92129 Unspecified place in nursing home as the place of occurrence of the external cause: Secondary | ICD-10-CM | POA: Diagnosis not present

## 2019-06-04 DIAGNOSIS — Y9389 Activity, other specified: Secondary | ICD-10-CM | POA: Insufficient documentation

## 2019-06-04 DIAGNOSIS — I5022 Chronic systolic (congestive) heart failure: Secondary | ICD-10-CM | POA: Insufficient documentation

## 2019-06-04 DIAGNOSIS — Y999 Unspecified external cause status: Secondary | ICD-10-CM | POA: Insufficient documentation

## 2019-06-04 DIAGNOSIS — Z8616 Personal history of COVID-19: Secondary | ICD-10-CM | POA: Insufficient documentation

## 2019-06-04 DIAGNOSIS — W01190A Fall on same level from slipping, tripping and stumbling with subsequent striking against furniture, initial encounter: Secondary | ICD-10-CM | POA: Diagnosis not present

## 2019-06-04 DIAGNOSIS — I110 Hypertensive heart disease with heart failure: Secondary | ICD-10-CM | POA: Insufficient documentation

## 2019-06-04 DIAGNOSIS — R0789 Other chest pain: Secondary | ICD-10-CM | POA: Insufficient documentation

## 2019-06-04 DIAGNOSIS — S0031XA Abrasion of nose, initial encounter: Secondary | ICD-10-CM

## 2019-06-04 DIAGNOSIS — S8011XA Contusion of right lower leg, initial encounter: Secondary | ICD-10-CM | POA: Diagnosis not present

## 2019-06-04 DIAGNOSIS — I4891 Unspecified atrial fibrillation: Secondary | ICD-10-CM | POA: Diagnosis not present

## 2019-06-04 DIAGNOSIS — S0083XA Contusion of other part of head, initial encounter: Secondary | ICD-10-CM | POA: Diagnosis not present

## 2019-06-04 DIAGNOSIS — S0990XA Unspecified injury of head, initial encounter: Secondary | ICD-10-CM | POA: Diagnosis present

## 2019-06-04 DIAGNOSIS — Z7901 Long term (current) use of anticoagulants: Secondary | ICD-10-CM | POA: Insufficient documentation

## 2019-06-04 MED ORDER — ACETAMINOPHEN 325 MG PO TABS
650.0000 mg | ORAL_TABLET | Freq: Once | ORAL | Status: AC
  Administered 2019-06-05: 650 mg via ORAL
  Filled 2019-06-04: qty 2

## 2019-06-04 NOTE — ED Triage Notes (Signed)
Pt presents by [facility] for evaluation of laceration to nose after hitting face on night stand when turning off light. EMS reports that pt is on coumadin and is hypertheraputic on last testing. Pt was COVID + two weeks ago per EMS.

## 2019-06-04 NOTE — Discharge Instructions (Addendum)
We saw you in the ER after you had a fall.  Your imaging showed that you broke your nose. There is no treatment for this. There was no evidence of fracture to the skull, neck, ribs, and no evidence of brain bleed.  You have a cut on your nose. Keep this clean with warm water and soap. Please have it reevaluated if the area gets red, hot to the touch, or starts to have thick, mucus-like drainage from the area.  You can take Tylenol for pain.  Please be very careful with walking, and do everything possible to prevent falls.  Return the emergency department for any fall, uncontrollable bleeding, or other new, concerning symptoms.

## 2019-06-04 NOTE — ED Provider Notes (Signed)
[facility] DEPT Provider Note   CSN: [id number] Arrival date & time: 06/04/19  2030     History Chief Complaint  Patient presents with  . Fall    Gabriela Hernandez is a 86 y.o. female.  HPI     86 year old female comes into the ER with chief complaint of fall. Patient is known to have Covid and resides at a nursing facility.  She has history of A. fib and is on Coumadin.  She had a mechanical fall earlier today.  Patient complains of pain to her chest and face.  Past Medical History:  Diagnosis Date  . Atrial fibrillation ([place])   . Chronic systolic CHF (congestive heart failure) ([place]) 04/05/2012  . Dilated cardiomyopathy ([place])    with ejection fraction of 30-35%  . Dysphagia   . Esophageal stricture   . History of shingles   . Hypertension   . Hypothyroidism   . Lightheadedness     Patient Active Problem List   Diagnosis Date Noted  . Hypertension 11/17/2016  . Sepsis ([place]) secondary to Escherichia coli bacteremia 06/28/2016  . AKI (acute kidney injury) ([place]) 06/28/2016  . Demand ischemia ([place]) 06/28/2016  . Elevated troponin 06/28/2016  . Elevated CK 06/28/2016  . Rhabdomyolysis 06/28/2016  . Septic shock ([place])   . Acute pulmonary edema (HCC)   . Dehydration 06/27/2016  . Diarrhea 06/27/2016  . Acute hyponatremia 06/27/2016  . Fall 06/27/2016  . Hypothyroidism 06/27/2016  . Hyperbilirubinemia 06/27/2016  . Encounter for therapeutic drug monitoring 06/14/2013  . Dyspnea 02/16/2013  . Fatigue 10/03/2012  . Chronic systolic CHF (congestive heart failure) ([place]) 04/05/2012  . Dilated cardiomyopathy ([place])   . Hypertensive heart disease with CHF ([place])   . Atrial fibrillation ([place]) 08/19/2010    Past Surgical History:  Procedure Laterality Date  . APPENDECTOMY    . CATARACT EXTRACTION    . CESAREAN SECTION    . FOOT SURGERY    . THYROIDECTOMY    . TONSILLECTOMY    . VEIN LIGATION       OB History   No obstetric history  on file.     Family History  Problem Relation Age of Onset  . Hypertension Mother   . Heart attack Father     Social History   Tobacco Use  . Smoking status: Never Smoker  . Smokeless tobacco: Never Used  Substance Use Topics  . Alcohol use: No  . Drug use: No    Home Medications Prior to Admission medications   Medication Sig Start Date End Date Taking? Authorizing Provider  carboxymethylcellulose (REFRESH PLUS) 0.5 % SOLN Place 1 drop into both eyes every 6 (six) hours as needed (Dry eyes).    [provider]  carvedilol (COREG) 3.125 MG tablet TAKE (1) TABLET BY MOUTH TWICE DAILY. Patient taking differently: Take 3.125 mg by mouth 2 (two) times daily with a meal.  01/19/17   [place], Peter M, MD  docusate sodium (COLACE) 100 MG capsule Take 100 mg by mouth daily as needed for mild constipation or moderate constipation.     [provider]  furosemide (LASIX) 20 MG tablet TAKE (1) TABLET BY MOUTH ONCE DAILY. Patient taking differently: Take 20 mg by mouth 2 (two) times daily. In the morning and at lunch 04/10/19   [place], Peter M, MD  lactose free nutrition (BOOST) LIQD Take 237 mLs by mouth daily as needed (Nutrition).    [provider]  levothyroxine (SYNTHROID) 112 MCG tablet  Take 112 mcg by mouth daily.  09/21/16   [provider]  Multiple Vitamins-Minerals (MULTIVITAMIN WITH MINERALS) tablet Take 1 tablet by mouth daily.    [provider]  potassium chloride (K-DUR,KLOR-CON) 10 MEQ tablet Take 10 mEq by mouth daily.    [provider]  quinapril (ACCUPRIL) 10 MG tablet TAKE 1/2 TABLET(5MG ) BY MOUTH ONCE A DAY. Patient taking differently: Take 5 mg by mouth daily.  10/14/16   [place], Peter M, MD  triamcinolone cream (KENALOG) 0.1 % Apply 1 application topically daily as needed (Vulva).    [provider]  warfarin (COUMADIN) 5 MG tablet Take 1 to 1.5 tablets by mouth daily as directed by coumadin clinic Patient  taking differently: Take 5 mg by mouth See admin instructions. Take 2.5 mg on Sunday and Thursday Take 5 mg all the other days in the evening 07/19/18   [place], Peter M, MD    Allergies    Aspirin  Review of Systems   Review of Systems  Unable to perform ROS: Dementia  Constitutional: Positive for activity change.  Musculoskeletal: Positive for arthralgias.  Skin: Positive for wound.  Neurological: Positive for headaches.  Hematological: Bruises/bleeds easily.    Physical Exam Updated Vital Signs Ht 5\' 4"  (1.626 m)   Wt 49 kg   BMI 18.54 kg/m   Physical Exam Vitals and nursing note reviewed.  Constitutional:      Appearance: She is well-developed.  HENT:     Head: Normocephalic and atraumatic.  Eyes:     Pupils: Pupils are equal, round, and reactive to light.  Neck:     Comments: In a c-collar Cardiovascular:     Rate and Rhythm: Normal rate.  Pulmonary:     Effort: Pulmonary effort is normal.  Abdominal:     General: Bowel sounds are normal.  Musculoskeletal:     Comments: Patient has a large hematoma to the right distal leg. Compression dressing has been applied to it.  She has no gross deformity over the upper or lower extremities.  She is moving all 4 extremities. Patient has no crepitus over the chest.  She is in a c-collar.  Patient has hematoma in ecchymosis over her face, most notably around her nose.  Skin:    General: Skin is warm and dry.  Neurological:     Mental Status: She is alert.     ED Results / Procedures / Treatments   Labs (all labs ordered are listed, but only abnormal results are displayed) Labs Reviewed - No data to display  EKG None  Radiology No results found.  Procedures Procedures (including critical care time)  Medications Ordered in ED Medications - No data to display  ED Course  I have reviewed the triage vital signs and the nursing notes.  Pertinent labs & imaging results that were available during my care of the  patient were reviewed by me and considered in my medical decision making (see chart for details).    MDM Rules/Calculators/A&P                       DDx includes: - Mechanical falls - ICH - Fractures - Contusions - Soft tissue injury  86 year old comes in a chief complaint of fall. Patient is on Coumadin.  Will get CT head and C-spine. It does not seem like she needs any imaging of her extremities.  She is complaining of some chest discomfort therefore we will get an x-ray to ensure  there is no pneumothorax.  No indication for lab work-up.  Final Clinical Impression(s) / ED Diagnoses Final diagnoses:  Contusion of face, initial encounter  Fall, initial encounter    Rx / DC Orders ED Discharge Orders    None       Derwood Kaplan, MD 06/04/19 2150

## 2019-06-04 NOTE — ED Provider Notes (Signed)
86 year old female received at signout from Dr. Rhunette Croft pending imaging. Per his HPI:  '86 year old female comes into the ER with chief complaint of fall. Patient is known to have Covid and resides at a nursing facility.  She has history of A. fib and is on Coumadin.  She had a mechanical fall earlier today.  Patient complains of pain to her chest and face."  Patient is from [facility].  Physical Exam  BP 107/67   Pulse 80   Resp 16   Ht 5\' 4"  (1.626 m)   Wt 49 kg   SpO2 90%   BMI 18.54 kg/m   Physical Exam Vitals and nursing note reviewed.  Constitutional:      General: She is not in acute distress.    Appearance: She is well-developed.     Comments: C-collar in place. Frail, elderly female Nasal cannula in place.  HENT:     Head: Normocephalic.     Comments: Contusion noted to the end barrier right periorbital region. There is a small superficial abrasion noted to the right side of the nose. Wound is hemostatic. Eyes:     Conjunctiva/sclera: Conjunctivae normal.  Cardiovascular:     Rate and Rhythm: Normal rate.  Pulmonary:     Effort: Pulmonary effort is normal.     Breath sounds: No stridor.  Musculoskeletal:        General: Normal range of motion.  Neurological:     Mental Status: She is alert and oriented to person, place, and time.     Comments: Oriented to self.     ED Course/Procedures     Procedures  MDM  86 year old female received at signout from Dr. 95 pending imaging. The patient presented from [facility] by EMS after she had a fall. She is on Coumadin for A. fib. Imaging is notable for bilateral nasal bone fractures. No other acute findings. C-collar has been removed. She has a superficial abrasion noted to the right side of her nose that wound care has been provided for in the ER. She is in no acute distress. At this time, the patient remains hemodynamically stable to return to [facility]. ER return precautions given.       04-08-1981 A, PA-C 06/05/19 0005    06/07/19, DO 06/05/19 0016

## 2019-06-05 ENCOUNTER — Telehealth: Payer: Self-pay | Admitting: Pharmacist

## 2019-06-05 NOTE — Telephone Encounter (Signed)
Received phone call from [facility] for [facility]. States that warfarin is on hold per NP due to excessive bruising that is spreading around the wound and her recent fall. They will be meeting tomorrow to discuss further plan.

## 2019-06-05 NOTE — Telephone Encounter (Signed)
OK  Nuri Larmer MD, FACC   

## 2019-06-05 NOTE — Telephone Encounter (Signed)
Patients son called stating hospice recommended patient stop warfarin due to bleeding from leg. She has a leg hematoma and according to daughter has an additional spot on leg that is bleeding. She also fell last night and has a contusion to the face.   I have called [facility] for clarification as to if they were requesting holding warfarin temporarily or permanently. Awaiting call back. INR on 1/15 was subtherapeutic, but INR a few days prior was supratherapeutic.  I will route to Dr. [place] for input.

## 2019-06-05 NOTE — Telephone Encounter (Signed)
Made son aware that warfarin is on hold due to bruising/bleeding and hospice team plans to meet tomorrow to discuss further risk vs benefit of warfarin

## 2019-06-05 NOTE — ED Notes (Signed)
[facility] called for transport back to [facility] and report given to nurse.

## 2019-06-19 DEATH — deceased
